# Patient Record
Sex: Male | Born: 1945 | Race: White | Hispanic: No | Smoking: Former smoker
Health system: Southern US, Community
[De-identification: ages and names within clinical notes are randomized; demographics above are authoritative.]

## PROBLEM LIST (undated history)

## (undated) DIAGNOSIS — W19XXXA Unspecified fall, initial encounter: Secondary | ICD-10-CM

## (undated) DIAGNOSIS — Z95810 Presence of automatic (implantable) cardiac defibrillator: Secondary | ICD-10-CM

## (undated) DIAGNOSIS — J449 Chronic obstructive pulmonary disease, unspecified: Secondary | ICD-10-CM

## (undated) DIAGNOSIS — I350 Nonrheumatic aortic (valve) stenosis: Secondary | ICD-10-CM

## (undated) DIAGNOSIS — R0989 Other specified symptoms and signs involving the circulatory and respiratory systems: Secondary | ICD-10-CM

## (undated) DIAGNOSIS — G4730 Sleep apnea, unspecified: Secondary | ICD-10-CM

## (undated) DIAGNOSIS — I255 Ischemic cardiomyopathy: Secondary | ICD-10-CM

## (undated) DIAGNOSIS — R55 Syncope and collapse: Secondary | ICD-10-CM

## (undated) DIAGNOSIS — I2510 Atherosclerotic heart disease of native coronary artery without angina pectoris: Secondary | ICD-10-CM

## (undated) DIAGNOSIS — F17201 Nicotine dependence, unspecified, in remission: Secondary | ICD-10-CM

## (undated) DIAGNOSIS — K859 Acute pancreatitis without necrosis or infection, unspecified: Secondary | ICD-10-CM

## (undated) DIAGNOSIS — E785 Hyperlipidemia, unspecified: Secondary | ICD-10-CM

## (undated) DIAGNOSIS — I219 Acute myocardial infarction, unspecified: Secondary | ICD-10-CM

## (undated) DIAGNOSIS — I10 Essential (primary) hypertension: Secondary | ICD-10-CM

## (undated) DIAGNOSIS — I5042 Chronic combined systolic (congestive) and diastolic (congestive) heart failure: Secondary | ICD-10-CM

## (undated) HISTORY — DX: Other specified symptoms and signs involving the circulatory and respiratory systems: R09.89

## (undated) HISTORY — DX: Hyperlipidemia, unspecified: E78.5

## (undated) HISTORY — DX: Essential (primary) hypertension: I10

## (undated) HISTORY — DX: Ischemic cardiomyopathy: I25.5

## (undated) HISTORY — PX: FEMORAL HERNIA REPAIR: SHX632

## (undated) HISTORY — DX: Unspecified fall, initial encounter: W19.XXXA

## (undated) HISTORY — DX: Nonrheumatic aortic (valve) stenosis: I35.0

## (undated) HISTORY — DX: Syncope and collapse: R55

## (undated) HISTORY — DX: Acute pancreatitis without necrosis or infection, unspecified: K85.90

## (undated) HISTORY — DX: Acute myocardial infarction, unspecified: I21.9

## (undated) HISTORY — DX: Presence of automatic (implantable) cardiac defibrillator: Z95.810

## (undated) HISTORY — PX: CARDIAC CATHETERIZATION: SHX172

## (undated) HISTORY — DX: Nicotine dependence, unspecified, in remission: F17.201

---

## 1998-01-23 DIAGNOSIS — R55 Syncope and collapse: Secondary | ICD-10-CM

## 1998-01-23 HISTORY — DX: Syncope and collapse: R55

## 1998-12-19 ENCOUNTER — Inpatient Hospital Stay (HOSPITAL_COMMUNITY): Admission: EM | Admit: 1998-12-19 | Discharge: 1998-12-29 | Payer: Self-pay

## 1998-12-19 ENCOUNTER — Encounter: Payer: Self-pay | Admitting: Emergency Medicine

## 1998-12-22 ENCOUNTER — Encounter: Payer: Self-pay | Admitting: General Surgery

## 2001-06-10 ENCOUNTER — Ambulatory Visit (HOSPITAL_COMMUNITY): Admission: RE | Admit: 2001-06-10 | Discharge: 2001-06-10 | Payer: Self-pay | Admitting: Cardiology

## 2001-10-07 ENCOUNTER — Ambulatory Visit (HOSPITAL_COMMUNITY): Admission: RE | Admit: 2001-10-07 | Discharge: 2001-10-07 | Payer: Self-pay | Admitting: Cardiology

## 2002-04-29 ENCOUNTER — Encounter: Payer: Self-pay | Admitting: Internal Medicine

## 2002-04-29 ENCOUNTER — Ambulatory Visit (HOSPITAL_COMMUNITY): Admission: RE | Admit: 2002-04-29 | Discharge: 2002-04-29 | Payer: Self-pay | Admitting: Internal Medicine

## 2002-05-01 ENCOUNTER — Encounter: Payer: Self-pay | Admitting: Internal Medicine

## 2002-05-01 ENCOUNTER — Ambulatory Visit (HOSPITAL_COMMUNITY): Admission: RE | Admit: 2002-05-01 | Discharge: 2002-05-01 | Payer: Self-pay | Admitting: Internal Medicine

## 2002-10-06 ENCOUNTER — Encounter: Payer: Self-pay | Admitting: Cardiology

## 2002-10-06 ENCOUNTER — Ambulatory Visit (HOSPITAL_COMMUNITY): Admission: RE | Admit: 2002-10-06 | Discharge: 2002-10-06 | Payer: Self-pay | Admitting: Cardiology

## 2004-01-24 DIAGNOSIS — R0989 Other specified symptoms and signs involving the circulatory and respiratory systems: Secondary | ICD-10-CM

## 2004-01-24 HISTORY — DX: Other specified symptoms and signs involving the circulatory and respiratory systems: R09.89

## 2004-02-29 ENCOUNTER — Ambulatory Visit: Payer: Self-pay | Admitting: Cardiology

## 2004-06-14 ENCOUNTER — Ambulatory Visit: Payer: Self-pay | Admitting: Cardiology

## 2004-06-14 ENCOUNTER — Ambulatory Visit (HOSPITAL_COMMUNITY): Admission: RE | Admit: 2004-06-14 | Discharge: 2004-06-14 | Payer: Self-pay | Admitting: Cardiology

## 2004-06-15 ENCOUNTER — Ambulatory Visit (HOSPITAL_COMMUNITY): Admission: RE | Admit: 2004-06-15 | Discharge: 2004-06-15 | Payer: Self-pay | Admitting: Cardiology

## 2004-06-15 ENCOUNTER — Encounter (HOSPITAL_COMMUNITY): Admission: RE | Admit: 2004-06-15 | Discharge: 2004-07-15 | Payer: Self-pay | Admitting: *Deleted

## 2004-06-15 ENCOUNTER — Ambulatory Visit: Payer: Self-pay | Admitting: Cardiology

## 2005-03-10 ENCOUNTER — Ambulatory Visit: Payer: Self-pay | Admitting: Cardiology

## 2006-05-04 ENCOUNTER — Ambulatory Visit: Payer: Self-pay | Admitting: Cardiology

## 2006-05-07 ENCOUNTER — Ambulatory Visit (HOSPITAL_COMMUNITY): Admission: RE | Admit: 2006-05-07 | Discharge: 2006-05-07 | Payer: Self-pay | Admitting: Cardiology

## 2006-05-07 ENCOUNTER — Ambulatory Visit: Payer: Self-pay | Admitting: Cardiovascular Disease

## 2006-06-08 ENCOUNTER — Ambulatory Visit: Payer: Self-pay | Admitting: Cardiology

## 2006-06-11 ENCOUNTER — Ambulatory Visit: Payer: Self-pay | Admitting: Cardiology

## 2006-06-13 ENCOUNTER — Ambulatory Visit (HOSPITAL_COMMUNITY): Admission: RE | Admit: 2006-06-13 | Discharge: 2006-06-13 | Payer: Self-pay | Admitting: General Surgery

## 2006-06-24 HISTORY — PX: CARDIAC DEFIBRILLATOR PLACEMENT: SHX171

## 2006-07-05 ENCOUNTER — Ambulatory Visit: Payer: Self-pay | Admitting: Internal Medicine

## 2006-07-12 ENCOUNTER — Ambulatory Visit: Payer: Self-pay | Admitting: Internal Medicine

## 2006-07-12 LAB — CONVERTED CEMR LAB
Basophils Relative: 0.6 % (ref 0.0–1.0)
CO2: 33 meq/L — ABNORMAL HIGH (ref 19–32)
Chloride: 110 meq/L (ref 96–112)
Eosinophils Absolute: 0.4 10*3/uL (ref 0.0–0.6)
Eosinophils Relative: 4.7 % (ref 0.0–5.0)
GFR calc non Af Amer: 91 mL/min
Glucose, Bld: 97 mg/dL (ref 70–99)
Hemoglobin: 15.3 g/dL (ref 13.0–17.0)
MCHC: 33.6 g/dL (ref 30.0–36.0)
Monocytes Absolute: 0.7 10*3/uL (ref 0.2–0.7)
Monocytes Relative: 7.6 % (ref 3.0–11.0)
Platelets: 180 10*3/uL (ref 150–400)
RBC: 4.71 M/uL (ref 4.22–5.81)
RDW: 13.4 % (ref 11.5–14.6)
Sodium: 142 meq/L (ref 135–145)

## 2006-07-17 ENCOUNTER — Ambulatory Visit: Payer: Self-pay | Admitting: Internal Medicine

## 2006-07-17 ENCOUNTER — Inpatient Hospital Stay (HOSPITAL_COMMUNITY): Admission: RE | Admit: 2006-07-17 | Discharge: 2006-07-18 | Payer: Self-pay | Admitting: Internal Medicine

## 2006-08-06 ENCOUNTER — Ambulatory Visit: Payer: Self-pay

## 2006-11-14 ENCOUNTER — Ambulatory Visit: Payer: Self-pay | Admitting: Internal Medicine

## 2007-02-07 ENCOUNTER — Ambulatory Visit: Payer: Self-pay | Admitting: Cardiology

## 2007-05-07 ENCOUNTER — Ambulatory Visit: Payer: Self-pay | Admitting: Internal Medicine

## 2007-07-10 IMAGING — CR DG CHEST 2V
2 series · 2 of 2 positions shown · non-contrast
Comparison: 07/17/06.

CLINICAL DATA: Post-AICD placement.  
 CHEST ? 2 VIEW:

[w chest pa]
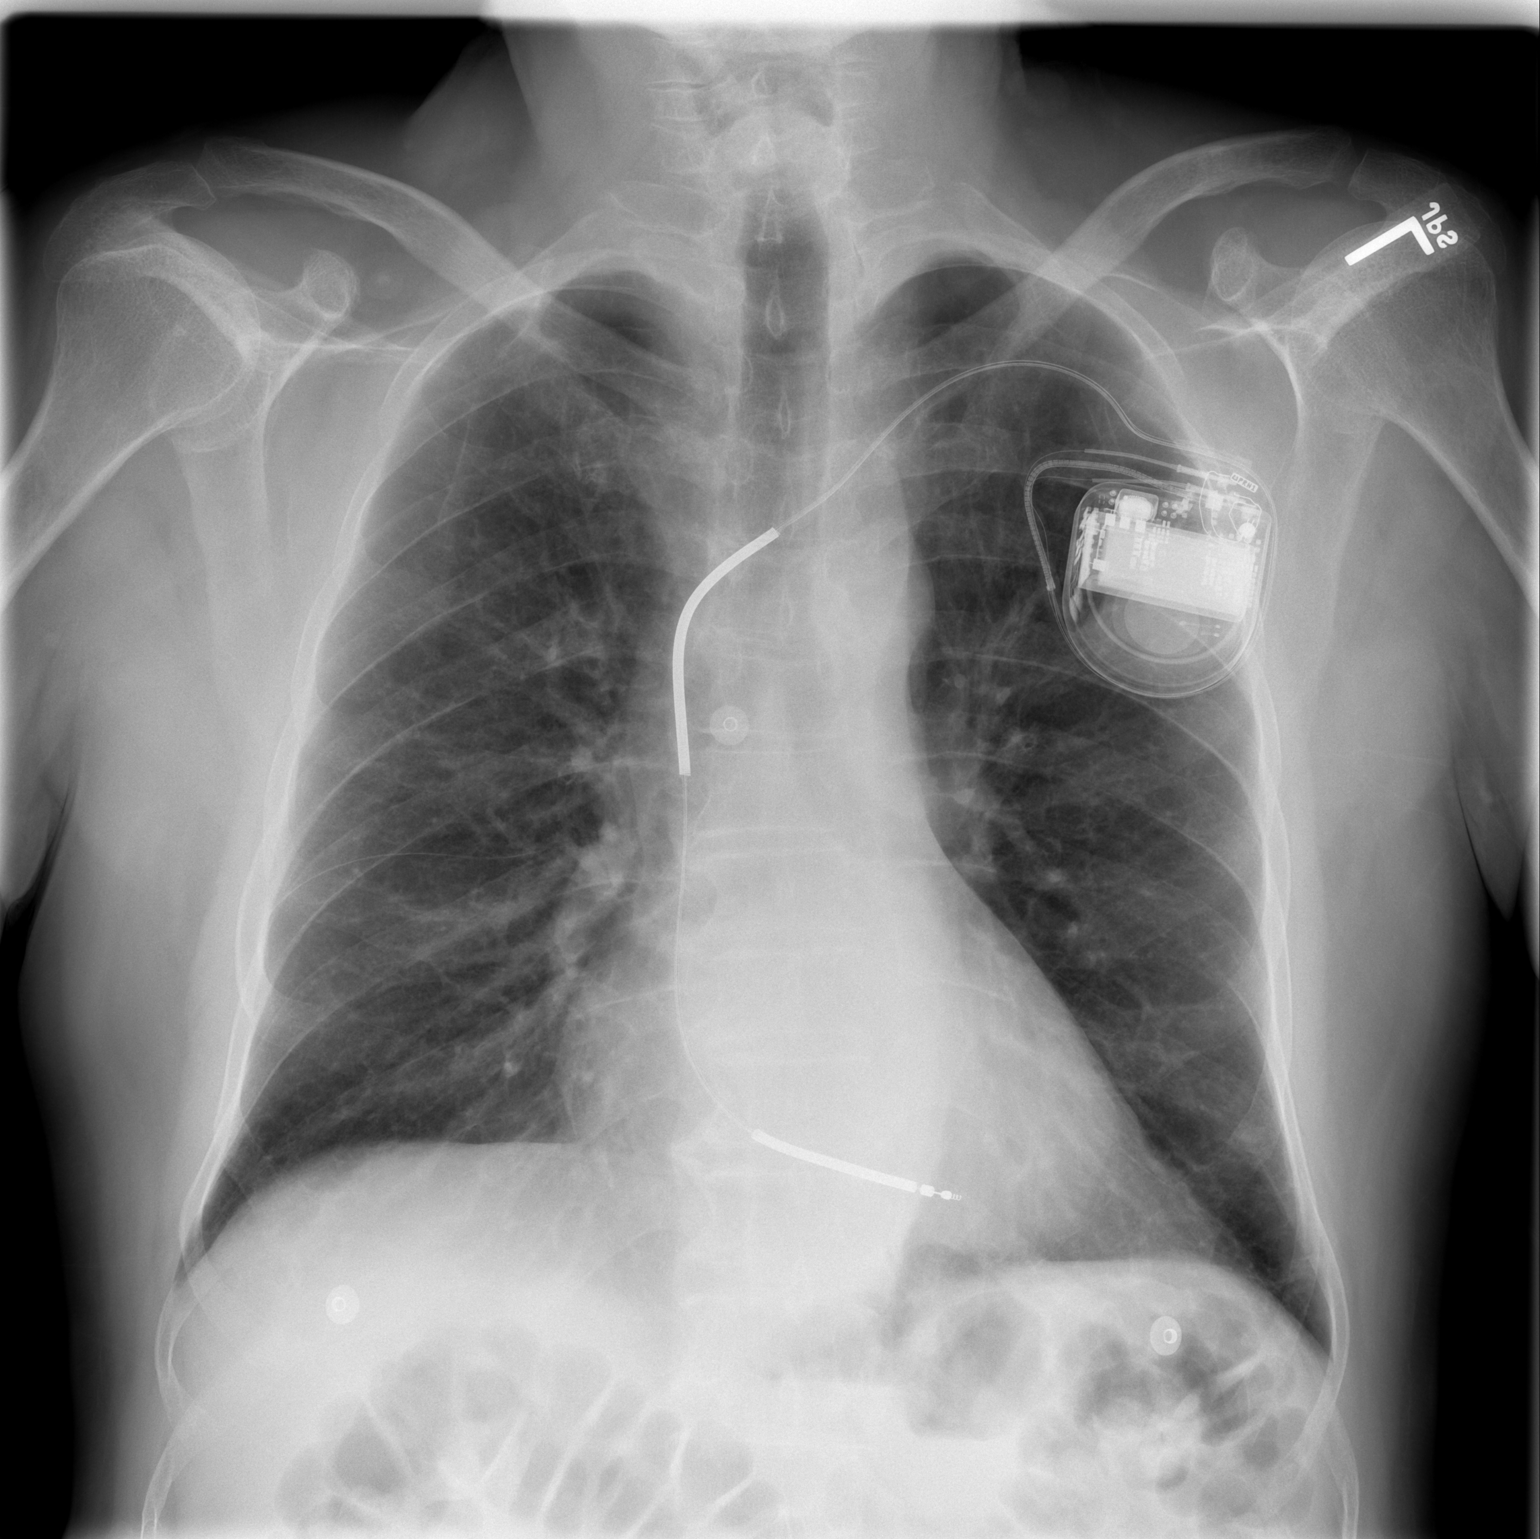

[w chest lat]
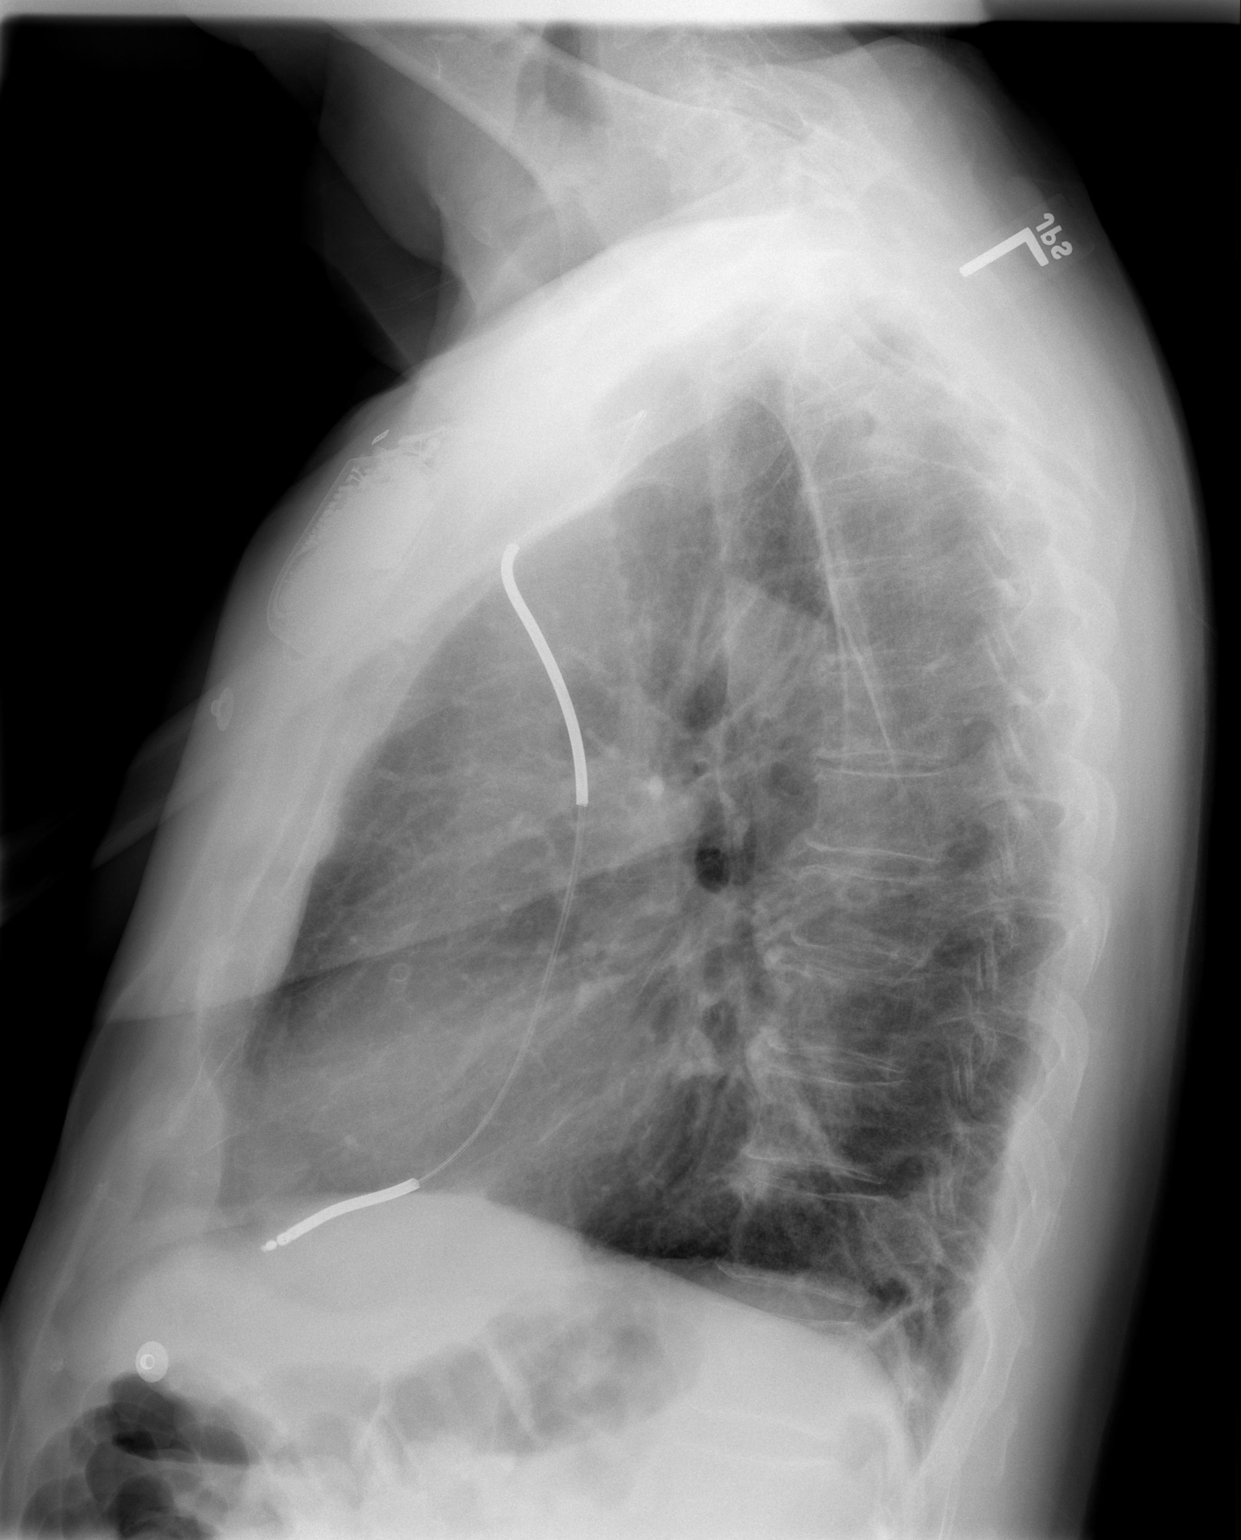

[2 of 2 positions shown; findings below may reference images not displayed]

FINDINGS: Status-post placement of a single lead AICD with its tip in the right ventricle.  No pneumothorax.  Lungs clear.  No heart failure.
IMPRESSION: Status-post placement of a single lead AICD ? no pneumothorax or active disease.

## 2007-07-31 ENCOUNTER — Ambulatory Visit: Payer: Self-pay | Admitting: Internal Medicine

## 2007-10-29 ENCOUNTER — Ambulatory Visit: Payer: Self-pay | Admitting: Internal Medicine

## 2008-01-28 ENCOUNTER — Ambulatory Visit: Payer: Self-pay | Admitting: Internal Medicine

## 2008-02-04 ENCOUNTER — Inpatient Hospital Stay (HOSPITAL_COMMUNITY): Admission: EM | Admit: 2008-02-04 | Discharge: 2008-02-06 | Payer: Self-pay | Admitting: Gastroenterology

## 2008-02-04 ENCOUNTER — Ambulatory Visit: Payer: Self-pay | Admitting: Cardiology

## 2008-02-04 ENCOUNTER — Ambulatory Visit (HOSPITAL_COMMUNITY): Admission: RE | Admit: 2008-02-04 | Discharge: 2008-02-04 | Payer: Self-pay | Admitting: Family Medicine

## 2008-02-05 ENCOUNTER — Encounter (INDEPENDENT_AMBULATORY_CARE_PROVIDER_SITE_OTHER): Payer: Self-pay | Admitting: Family Medicine

## 2008-02-13 ENCOUNTER — Ambulatory Visit: Payer: Self-pay | Admitting: Cardiology

## 2008-02-17 ENCOUNTER — Ambulatory Visit: Payer: Self-pay | Admitting: Cardiology

## 2008-02-17 ENCOUNTER — Encounter: Payer: Self-pay | Admitting: Physician Assistant

## 2008-02-26 ENCOUNTER — Ambulatory Visit: Payer: Self-pay | Admitting: Cardiology

## 2008-03-03 ENCOUNTER — Ambulatory Visit (HOSPITAL_COMMUNITY): Admission: RE | Admit: 2008-03-03 | Discharge: 2008-03-03 | Payer: Self-pay | Admitting: Cardiology

## 2008-03-18 ENCOUNTER — Encounter: Payer: Self-pay | Admitting: Internal Medicine

## 2008-05-14 ENCOUNTER — Encounter: Payer: Self-pay | Admitting: Internal Medicine

## 2008-05-14 ENCOUNTER — Ambulatory Visit: Payer: Self-pay | Admitting: Internal Medicine

## 2008-07-13 DIAGNOSIS — I2589 Other forms of chronic ischemic heart disease: Secondary | ICD-10-CM | POA: Insufficient documentation

## 2008-07-29 ENCOUNTER — Ambulatory Visit: Payer: Self-pay | Admitting: Internal Medicine

## 2008-07-29 ENCOUNTER — Encounter: Payer: Self-pay | Admitting: Internal Medicine

## 2008-08-03 ENCOUNTER — Encounter: Payer: Self-pay | Admitting: Cardiology

## 2008-08-03 ENCOUNTER — Telehealth: Payer: Self-pay | Admitting: Cardiology

## 2008-09-02 ENCOUNTER — Ambulatory Visit: Payer: Self-pay | Admitting: Cardiology

## 2008-09-02 ENCOUNTER — Emergency Department (HOSPITAL_COMMUNITY): Admission: EM | Admit: 2008-09-02 | Discharge: 2008-09-02 | Payer: Self-pay | Admitting: Emergency Medicine

## 2008-09-08 ENCOUNTER — Encounter: Payer: Self-pay | Admitting: Cardiology

## 2008-09-08 ENCOUNTER — Ambulatory Visit: Payer: Self-pay | Admitting: Cardiology

## 2008-09-08 LAB — CONVERTED CEMR LAB
BUN: 15 mg/dL (ref 6–23)
Basophils Absolute: 0 10*3/uL (ref 0.0–0.1)
Basophils Relative: 0 % (ref 0–1)
CO2: 30 meq/L (ref 19–32)
Calcium: 9.6 mg/dL (ref 8.4–10.5)
Chloride: 107 meq/L (ref 96–112)
Creatinine, Ser: 0.97 mg/dL (ref 0.40–1.50)
Eosinophils Absolute: 0.3 10*3/uL (ref 0.0–0.7)
Eosinophils Relative: 3 % (ref 0–5)
Glucose, Bld: 105 mg/dL — ABNORMAL HIGH (ref 70–99)
HCT: 43.1 % (ref 39.0–52.0)
Hemoglobin: 14.8 g/dL (ref 13.0–17.0)
INR: 1 (ref 0.0–1.5)
Lymphocytes Relative: 18 % (ref 12–46)
Lymphs Abs: 1.4 10*3/uL (ref 0.7–4.0)
MCHC: 34.3 g/dL (ref 30.0–36.0)
MCV: 93.2 fL (ref 78.0–100.0)
Monocytes Absolute: 0.6 10*3/uL (ref 0.1–1.0)
Monocytes Relative: 8 % (ref 3–12)
Neutro Abs: 5.8 10*3/uL (ref 1.7–7.7)
Neutrophils Relative %: 71 % (ref 43–77)
Platelets: 184 10*3/uL (ref 150–400)
Potassium: 4.8 meq/L (ref 3.5–5.3)
Prothrombin Time: 13.1 s (ref 11.6–15.2)
RBC: 4.63 M/uL (ref 4.22–5.81)
RDW: 13.4 % (ref 11.5–15.5)
Sodium: 140 meq/L (ref 135–145)
WBC: 8.1 10*3/uL (ref 4.0–10.5)
aPTT: 32 s (ref 24–37)

## 2008-09-16 ENCOUNTER — Ambulatory Visit: Payer: Self-pay | Admitting: Cardiology

## 2008-09-16 ENCOUNTER — Ambulatory Visit (HOSPITAL_COMMUNITY): Admission: RE | Admit: 2008-09-16 | Discharge: 2008-09-16 | Payer: Self-pay | Admitting: Cardiology

## 2008-09-18 ENCOUNTER — Encounter (INDEPENDENT_AMBULATORY_CARE_PROVIDER_SITE_OTHER): Payer: Self-pay | Admitting: *Deleted

## 2008-09-18 LAB — CONVERTED CEMR LAB
BUN: 15 mg/dL
Calcium: 9.6 mg/dL
Creatinine, Ser: 0.97 mg/dL
Glucose, Bld: 105 mg/dL
INR: 1
Prothrombin Time: 13.1 s
Sodium: 140 meq/L

## 2008-09-23 ENCOUNTER — Encounter: Payer: Self-pay | Admitting: Cardiology

## 2008-09-23 HISTORY — PX: CORONARY ARTERY BYPASS GRAFT: SHX141

## 2008-09-24 ENCOUNTER — Ambulatory Visit: Payer: Self-pay | Admitting: Cardiothoracic Surgery

## 2008-09-30 ENCOUNTER — Encounter: Payer: Self-pay | Admitting: Cardiothoracic Surgery

## 2008-09-30 ENCOUNTER — Ambulatory Visit (HOSPITAL_COMMUNITY): Admission: RE | Admit: 2008-09-30 | Discharge: 2008-09-30 | Payer: Self-pay | Admitting: Cardiothoracic Surgery

## 2008-10-02 ENCOUNTER — Inpatient Hospital Stay (HOSPITAL_COMMUNITY): Admission: RE | Admit: 2008-10-02 | Discharge: 2008-10-11 | Payer: Self-pay | Admitting: Cardiothoracic Surgery

## 2008-10-02 ENCOUNTER — Ambulatory Visit: Payer: Self-pay | Admitting: Cardiovascular Disease

## 2008-10-02 ENCOUNTER — Ambulatory Visit: Payer: Self-pay | Admitting: Cardiothoracic Surgery

## 2008-10-02 ENCOUNTER — Encounter: Payer: Self-pay | Admitting: Cardiothoracic Surgery

## 2008-10-09 ENCOUNTER — Encounter: Payer: Self-pay | Admitting: Cardiothoracic Surgery

## 2008-10-09 ENCOUNTER — Encounter (INDEPENDENT_AMBULATORY_CARE_PROVIDER_SITE_OTHER): Payer: Self-pay | Admitting: *Deleted

## 2008-10-09 LAB — CONVERTED CEMR LAB
BUN: 19 mg/dL
Brain Natriuretic Peptide: 289
CO2: 31 meq/L
Glomerular Filtration Rate, Af Am: >60 mL/min/{1.73_m2}
Glucose, Bld: 115 mg/dL
Platelets: 167 10*3/uL
Potassium: 3.6 meq/L
Sodium: 135 meq/L
WBC: 9.7 10*3/uL

## 2008-10-22 ENCOUNTER — Encounter: Payer: Self-pay | Admitting: *Deleted

## 2008-10-23 ENCOUNTER — Encounter: Payer: Self-pay | Admitting: Cardiology

## 2008-10-28 ENCOUNTER — Encounter (INDEPENDENT_AMBULATORY_CARE_PROVIDER_SITE_OTHER): Payer: Self-pay | Admitting: *Deleted

## 2008-10-28 ENCOUNTER — Ambulatory Visit: Payer: Self-pay | Admitting: Cardiology

## 2008-10-28 DIAGNOSIS — J449 Chronic obstructive pulmonary disease, unspecified: Secondary | ICD-10-CM | POA: Insufficient documentation

## 2008-11-05 ENCOUNTER — Ambulatory Visit: Payer: Self-pay | Admitting: Cardiothoracic Surgery

## 2008-11-05 ENCOUNTER — Encounter: Admission: RE | Admit: 2008-11-05 | Discharge: 2008-11-05 | Payer: Self-pay | Admitting: Cardiothoracic Surgery

## 2008-11-05 ENCOUNTER — Encounter: Payer: Self-pay | Admitting: Cardiology

## 2008-11-06 ENCOUNTER — Encounter: Payer: Self-pay | Admitting: Internal Medicine

## 2008-11-12 ENCOUNTER — Encounter: Payer: Self-pay | Admitting: Internal Medicine

## 2008-11-13 ENCOUNTER — Ambulatory Visit: Payer: Self-pay | Admitting: Internal Medicine

## 2008-11-19 ENCOUNTER — Encounter: Payer: Self-pay | Admitting: Internal Medicine

## 2008-11-30 ENCOUNTER — Encounter (HOSPITAL_COMMUNITY): Admission: RE | Admit: 2008-11-30 | Discharge: 2008-12-30 | Payer: Self-pay | Admitting: Cardiology

## 2008-12-03 ENCOUNTER — Ambulatory Visit: Payer: Self-pay | Admitting: Cardiothoracic Surgery

## 2008-12-03 ENCOUNTER — Encounter: Admission: RE | Admit: 2008-12-03 | Discharge: 2008-12-03 | Payer: Self-pay | Admitting: Cardiothoracic Surgery

## 2008-12-23 ENCOUNTER — Encounter: Payer: Self-pay | Admitting: Cardiology

## 2008-12-30 ENCOUNTER — Encounter (HOSPITAL_COMMUNITY): Admission: RE | Admit: 2008-12-30 | Discharge: 2009-01-22 | Payer: Self-pay | Admitting: Cardiology

## 2009-01-01 ENCOUNTER — Encounter: Payer: Self-pay | Admitting: Cardiology

## 2009-01-20 ENCOUNTER — Encounter: Payer: Self-pay | Admitting: Cardiology

## 2009-01-25 ENCOUNTER — Encounter (HOSPITAL_COMMUNITY): Admission: RE | Admit: 2009-01-25 | Discharge: 2009-02-24 | Payer: Self-pay | Admitting: Cardiology

## 2009-02-12 ENCOUNTER — Telehealth (INDEPENDENT_AMBULATORY_CARE_PROVIDER_SITE_OTHER): Payer: Self-pay | Admitting: *Deleted

## 2009-02-16 ENCOUNTER — Ambulatory Visit: Payer: Self-pay | Admitting: Internal Medicine

## 2009-02-24 ENCOUNTER — Encounter: Payer: Self-pay | Admitting: Internal Medicine

## 2009-02-25 ENCOUNTER — Encounter (INDEPENDENT_AMBULATORY_CARE_PROVIDER_SITE_OTHER): Payer: Self-pay | Admitting: *Deleted

## 2009-02-25 ENCOUNTER — Encounter (HOSPITAL_COMMUNITY): Admission: RE | Admit: 2009-02-25 | Discharge: 2009-03-27 | Payer: Self-pay | Admitting: Cardiology

## 2009-02-26 ENCOUNTER — Encounter (INDEPENDENT_AMBULATORY_CARE_PROVIDER_SITE_OTHER): Payer: Self-pay | Admitting: *Deleted

## 2009-02-26 ENCOUNTER — Ambulatory Visit: Payer: Self-pay | Admitting: Cardiology

## 2009-02-26 DIAGNOSIS — I2510 Atherosclerotic heart disease of native coronary artery without angina pectoris: Secondary | ICD-10-CM | POA: Insufficient documentation

## 2009-02-26 DIAGNOSIS — Z95810 Presence of automatic (implantable) cardiac defibrillator: Secondary | ICD-10-CM | POA: Insufficient documentation

## 2009-03-01 ENCOUNTER — Encounter (INDEPENDENT_AMBULATORY_CARE_PROVIDER_SITE_OTHER): Payer: Self-pay | Admitting: *Deleted

## 2009-03-01 ENCOUNTER — Encounter: Payer: Self-pay | Admitting: Cardiology

## 2009-03-01 LAB — CONVERTED CEMR LAB
ALT: 32 units/L (ref 0–53)
AST: 24 units/L (ref 0–37)
Albumin: 4.3 g/dL
Alkaline Phosphatase: 58 units/L (ref 39–117)
BUN: 18 mg/dL
Basophils Absolute: 0 10*3/uL
Basophils Absolute: 0 10*3/uL (ref 0.0–0.1)
Basophils Relative: 0 %
Basophils Relative: 0 % (ref 0–1)
CO2: 28 meq/L
Calcium: 9.3 mg/dL
Calcium: 9.3 mg/dL (ref 8.4–10.5)
Chloride: 105 meq/L
Chloride: 105 meq/L (ref 96–112)
Cholesterol: 100 mg/dL (ref 0–200)
Eosinophils Absolute: 0.2 10*3/uL
Eosinophils Absolute: 0.2 10*3/uL (ref 0.0–0.7)
Eosinophils Relative: 3 % (ref 0–5)
Glucose, Bld: 99 mg/dL
HCT: 45.8 %
MCHC: 31.9 g/dL
MCV: 92.7 fL
Monocytes Absolute: 0.5 10*3/uL
Monocytes Relative: 7 %
Monocytes Relative: 7 % (ref 3–12)
Neutro Abs: 4.8 10*3/uL (ref 1.7–7.7)
Neutrophils Relative %: 66 % (ref 43–77)
Platelets: 162 10*3/uL
Potassium: 4.7 meq/L
Potassium: 4.7 meq/L (ref 3.5–5.3)
RBC: 4.94 M/uL (ref 4.22–5.81)
Sodium: 141 meq/L
TSH: 2.119 microintl units/mL (ref 0.350–4.500)
Total Protein: 6.4 g/dL (ref 6.0–8.3)
WBC: 7.2 10*3/uL
WBC: 7.2 10*3/uL (ref 4.0–10.5)

## 2009-03-02 ENCOUNTER — Encounter: Payer: Self-pay | Admitting: Cardiology

## 2009-04-07 ENCOUNTER — Telehealth (INDEPENDENT_AMBULATORY_CARE_PROVIDER_SITE_OTHER): Payer: Self-pay | Admitting: *Deleted

## 2009-04-09 ENCOUNTER — Encounter: Payer: Self-pay | Admitting: Cardiology

## 2009-05-18 ENCOUNTER — Ambulatory Visit: Payer: Self-pay | Admitting: Internal Medicine

## 2009-06-03 ENCOUNTER — Encounter: Payer: Self-pay | Admitting: Internal Medicine

## 2009-06-08 ENCOUNTER — Telehealth (INDEPENDENT_AMBULATORY_CARE_PROVIDER_SITE_OTHER): Payer: Self-pay | Admitting: *Deleted

## 2009-07-09 ENCOUNTER — Telehealth (INDEPENDENT_AMBULATORY_CARE_PROVIDER_SITE_OTHER): Payer: Self-pay | Admitting: *Deleted

## 2009-08-10 ENCOUNTER — Ambulatory Visit: Payer: Self-pay | Admitting: Internal Medicine

## 2009-08-24 ENCOUNTER — Encounter (INDEPENDENT_AMBULATORY_CARE_PROVIDER_SITE_OTHER): Payer: Self-pay | Admitting: *Deleted

## 2009-08-27 ENCOUNTER — Encounter (INDEPENDENT_AMBULATORY_CARE_PROVIDER_SITE_OTHER): Payer: Self-pay | Admitting: *Deleted

## 2009-08-27 ENCOUNTER — Ambulatory Visit: Payer: Self-pay | Admitting: Cardiology

## 2009-08-27 DIAGNOSIS — D492 Neoplasm of unspecified behavior of bone, soft tissue, and skin: Secondary | ICD-10-CM

## 2009-09-01 ENCOUNTER — Ambulatory Visit (HOSPITAL_COMMUNITY): Admission: RE | Admit: 2009-09-01 | Discharge: 2009-09-01 | Payer: Self-pay | Admitting: Cardiology

## 2009-09-01 ENCOUNTER — Ambulatory Visit: Payer: Self-pay | Admitting: Cardiology

## 2009-09-01 ENCOUNTER — Telehealth (INDEPENDENT_AMBULATORY_CARE_PROVIDER_SITE_OTHER): Payer: Self-pay

## 2009-09-01 ENCOUNTER — Encounter: Payer: Self-pay | Admitting: Cardiology

## 2009-10-28 IMAGING — CR DG CHEST 2V
2 series · 2 of 2 positions shown · non-contrast
Comparison: Chest x-ray of 10/09/2008

CLINICAL DATA: CABG in September 2008, follow-up

CHEST - 2 VIEW

[w chest pa]
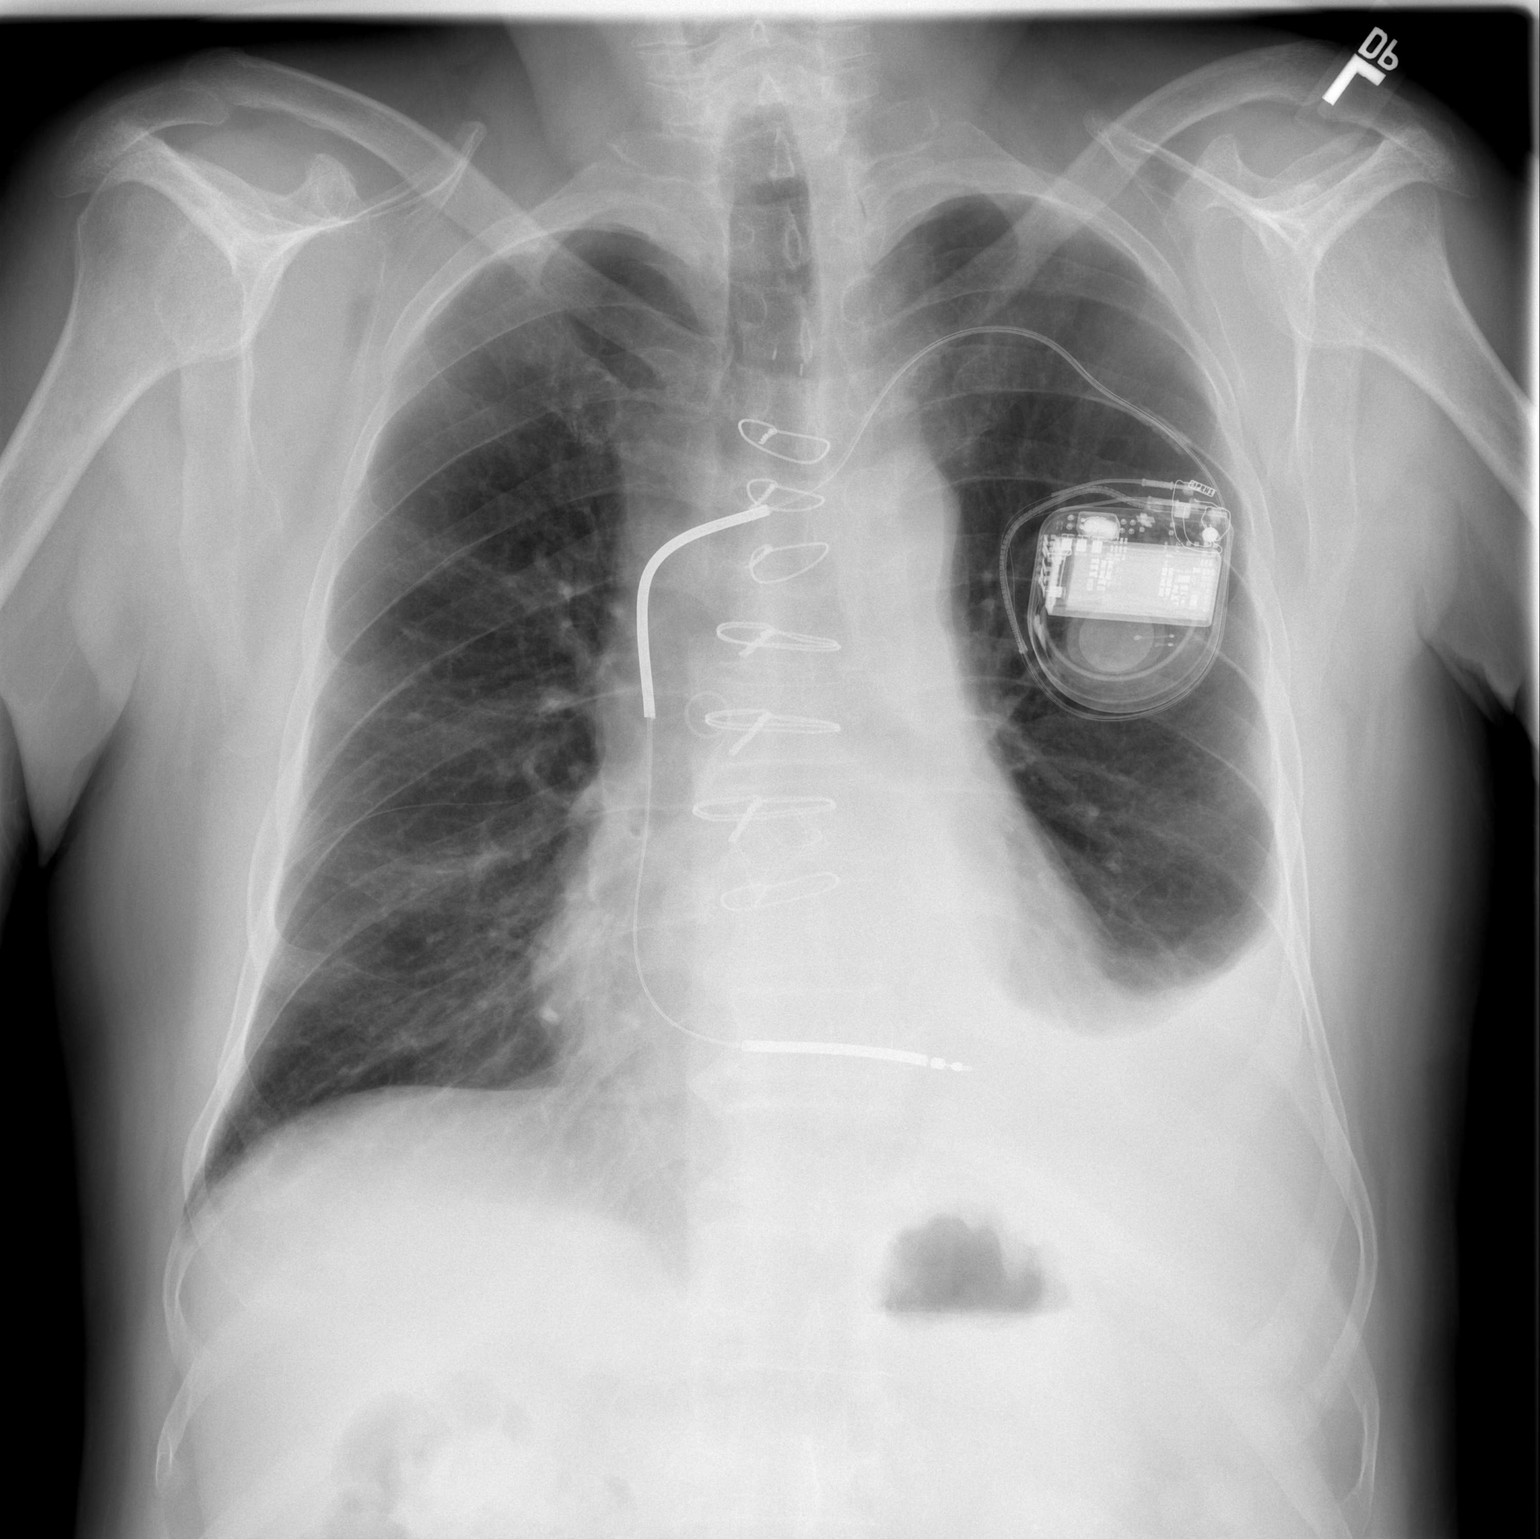

[w chest lat]
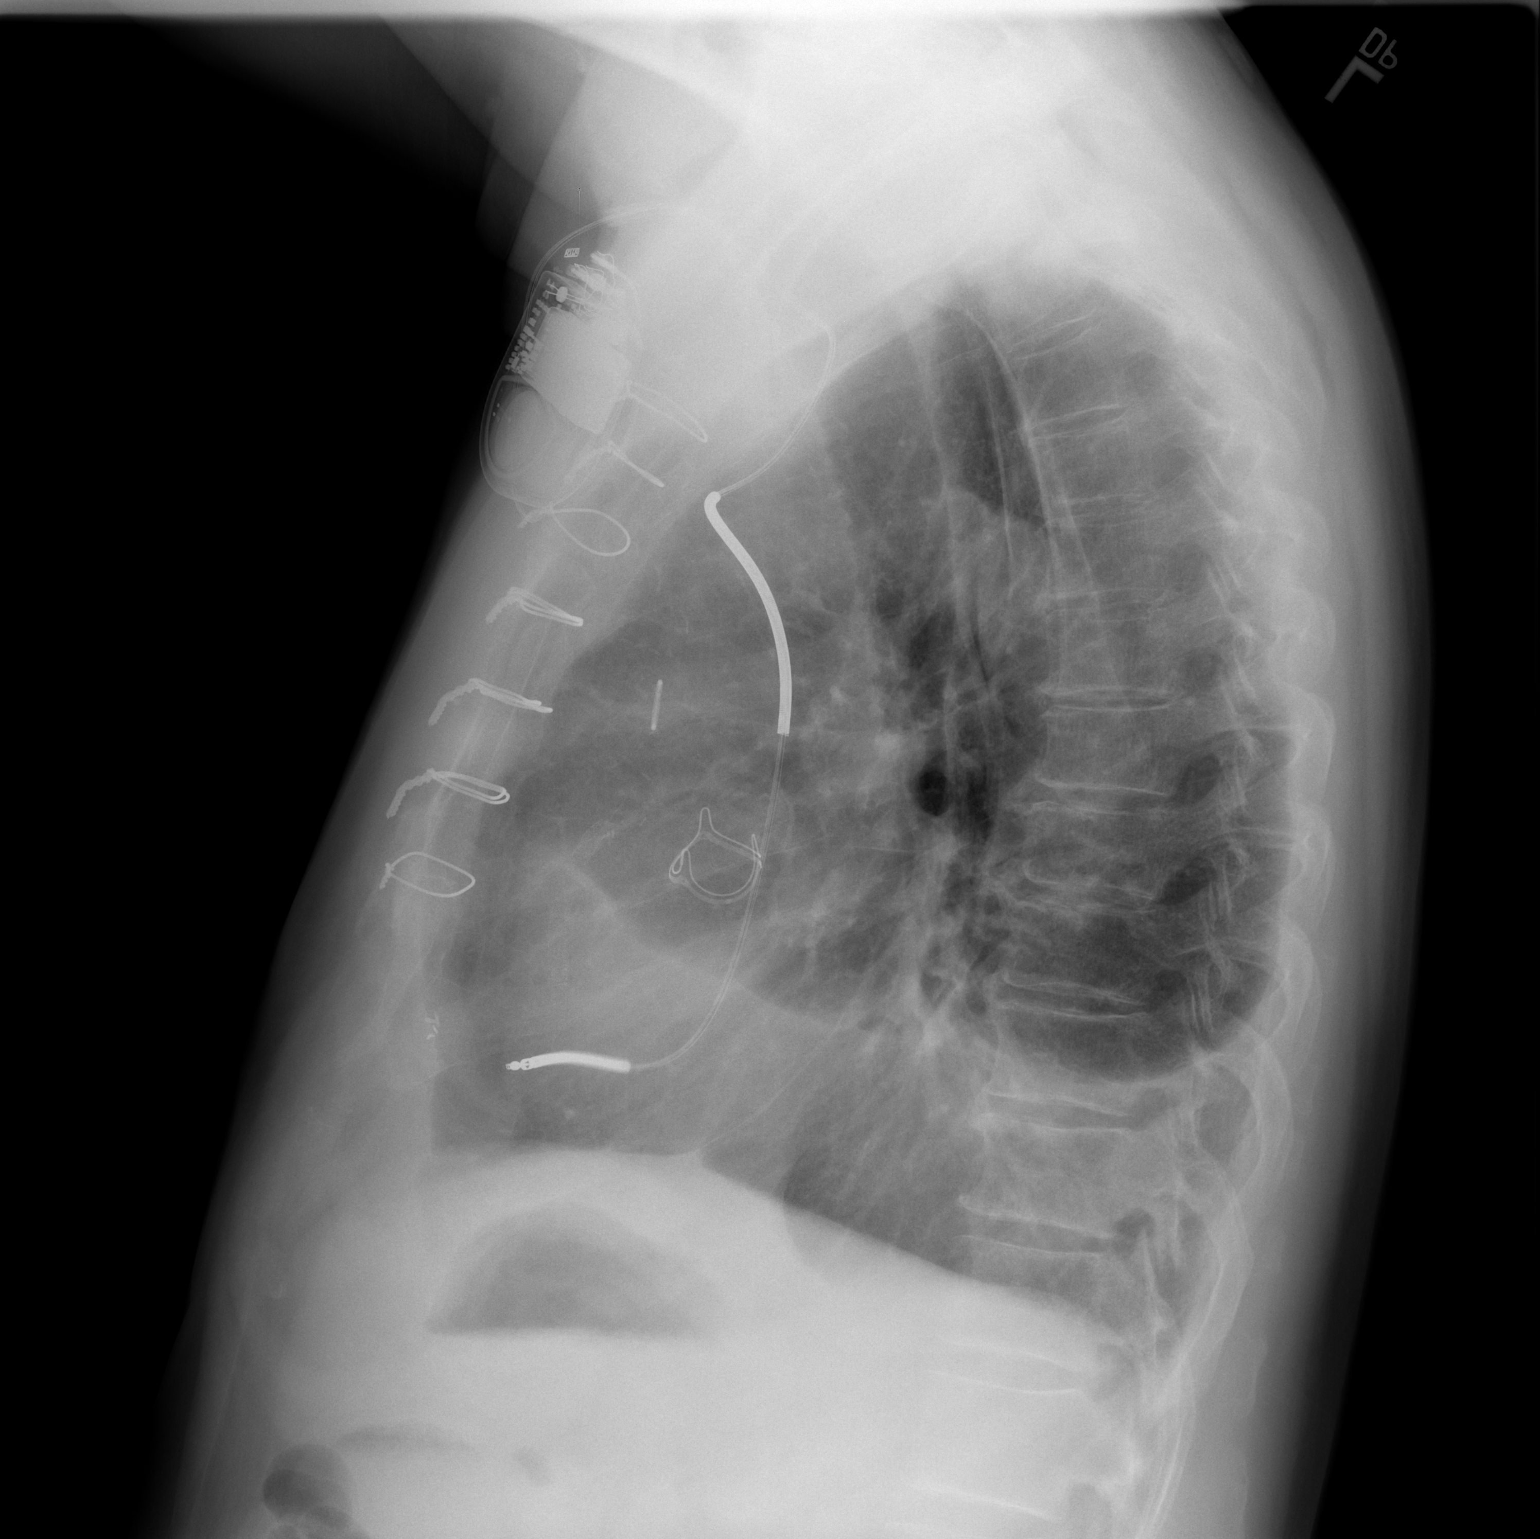

[2 of 2 positions shown; findings below may reference images not displayed]

FINDINGS: There is little change in the left pleural effusion and
left basilar atelectasis.  The small right effusion noted
previously has resolved.  No pneumothorax is seen.  Mild
cardiomegaly is present.  A permanent pacemaker with AICD lead
remains.  Median sternotomy sutures are present.
IMPRESSION: No significant change in small left pleural effusion with left
basilar atelectasis.  Resolution of small right effusion.

## 2009-11-01 ENCOUNTER — Ambulatory Visit: Payer: Self-pay | Admitting: Internal Medicine

## 2009-11-19 ENCOUNTER — Encounter: Payer: Self-pay | Admitting: Internal Medicine

## 2009-12-15 ENCOUNTER — Encounter: Payer: Self-pay | Admitting: Internal Medicine

## 2010-01-23 HISTORY — PX: COLONOSCOPY W/ POLYPECTOMY: SHX1380

## 2010-02-14 ENCOUNTER — Telehealth: Payer: Self-pay | Admitting: Internal Medicine

## 2010-02-20 LAB — CONVERTED CEMR LAB
AST: 22 units/L (ref 0–37)
Albumin: 3.8 g/dL (ref 3.5–5.2)
BUN: 10 mg/dL (ref 6–23)
Basophils Relative: 0 % (ref 0–1)
Calcium: 9.2 mg/dL (ref 8.4–10.5)
Creatinine, Ser: 0.92 mg/dL (ref 0.40–1.50)
Eosinophils Absolute: 0.2 10*3/uL (ref 0.0–0.7)
HCT: 42.4 % (ref 39.0–52.0)
INR: 1 (ref 0.0–1.5)
MCV: 93.1 fL (ref 78.0–100.0)
Monocytes Relative: 8 % (ref 3–12)
Neutro Abs: 5 10*3/uL (ref 1.7–7.7)
Neutrophils Relative %: 67 % (ref 43–77)
Prothrombin Time: 13 s (ref 11.6–15.2)
RDW: 13.8 % (ref 11.5–15.5)
TSH: 2.012 microintl units/mL (ref 0.350–4.500)
Total Bilirubin: 0.7 mg/dL (ref 0.3–1.2)
Total Protein: 6.5 g/dL (ref 6.0–8.3)
WBC: 7.4 10*3/uL (ref 4.0–10.5)

## 2010-02-24 ENCOUNTER — Encounter: Payer: Self-pay | Admitting: Internal Medicine

## 2010-02-24 ENCOUNTER — Ambulatory Visit: Admit: 2010-02-24 | Payer: Self-pay | Admitting: Internal Medicine

## 2010-02-24 ENCOUNTER — Encounter (INDEPENDENT_AMBULATORY_CARE_PROVIDER_SITE_OTHER): Payer: Medicare Other

## 2010-02-24 DIAGNOSIS — I428 Other cardiomyopathies: Secondary | ICD-10-CM

## 2010-02-24 NOTE — Miscellaneous (Signed)
Summary: Rehab Report  Rehab Report   Imported By: Faythe Ghee 01/28/2009 12:51:03  _____________________________________________________________________  External Attachment:    Type:   Image     Comment:   External Document

## 2010-02-24 NOTE — Cardiovascular Report (Signed)
Summary: Office Visit Remote   Office Visit Remote   Imported By: Roderic Ovens 06/07/2009 15:36:22  _____________________________________________________________________  External Attachment:    Type:   Image     Comment:   External Document

## 2010-02-24 NOTE — Miscellaneous (Signed)
Summary: [name] Discharge Note  [name] Discharge Note   Imported By: Roderic Ovens 05/19/2009 16:14:29  _____________________________________________________________________  External Attachment:    Type:   Image     Comment:   External Document
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
## 2010-02-24 NOTE — Progress Notes (Signed)
Summary: RESULTS OF ECHO  Phone Note Call from Patient   Summary of Call: PT STATES HE HAD AN ECHO THIS AM AND STARTING 8-15 THE NUMBER TO REACH HIM AT FOR THAT WEEK IS [phone] TO GIVE RESULTS. Initial call taken by: Park Breed,  September 01, 2009 12:01 PM  Follow-up for Phone Call        Pt. advised. Follow-up by: Larita Fife Via LPN,  September 17, 2009 9:46 AM

## 2010-02-24 NOTE — Letter (Signed)
Summary: [place] Results Letter  [facility]  [address]   [place], [place] [address]   Phone: [phone]  Fax: [phone]      March 02, 2009 MRN: [id number]   [name] [address] [place], [place]  [address]   Dear [name],  Your test ordered by  Heartcare has been reviewed by your physician (or physician assistant) and was found to be normal or stable. Your physician (or physician assistant) felt no changes were needed at this time.  ____ Echocardiogram  ____ Cardiac Stress Test  __X__ Lab Work  ____ Peripheral vascular study of arms, legs or neck  ____ CT scan or X-ray  ____ Lung or Breathing test  ____ Other: Please continue on current medical treatment.  Thank you.   Wacey Zieger, MD, F.A.C.C   

## 2010-02-24 NOTE — Progress Notes (Signed)
Summary: question re device  Phone Note Call from Patient Call back at Home Phone [phone]   Caller: Patient Reason for Call: Talk to Nurse Summary of Call: pt calling re results from transmission Initial call taken by: Roe Coombs,  February 14, 2010 2:30 PM  Follow-up for Phone Call        lmom --pt to send transmission on 02-24-10.  pt to call if further questions. Vella Kohler  February 15, 2010 8:59 AM

## 2010-02-24 NOTE — Cardiovascular Report (Signed)
Summary: Office Visit Remote   Office Visit Remote   Imported By: Roderic Ovens 12/27/2009 14:55:07  _____________________________________________________________________  External Attachment:    Type:   Image     Comment:   External Document

## 2010-02-24 NOTE — Progress Notes (Signed)
Summary: Refill  Phone Note Other Incoming   Caller: pt walk into office Summary of Call: pt states for next refill on Carvedilol, he would like 12.5mg  instead of cutting 25mg /pt has already picked up RX for this mth, but would like it changed for his next refill/tg Initial call taken by: Raechel Ache [facility],  Jun 08, 2009 3:35 PM    New/Updated Medications: CARVEDILOL 12.5 MG TABS (CARVEDILOL) Take one tablet by mouth twice a day Prescriptions: CARVEDILOL 12.5 MG TABS (CARVEDILOL) Take one tablet by mouth twice a day  #60 x 3   Entered by:   Teressa Lower RN   Authorized by:   Kathlen Brunswick, MD, [facility]   Signed by:   Teressa Lower RN on 06/09/2009   Method used:   Electronically to        [facility] [phone]* (retail)       [address]       [place], [place]  [address]       Ph: [phone] or [phone]       Fax: [phone]   RxID:   [id number]

## 2010-02-24 NOTE — Miscellaneous (Signed)
Summary: LABS PT,INR,BMP,09/08/2008  Clinical Lists Changes  Observations: Added new observation of CALCIUM: 9.6 mg/dL (40/98/1191 47:82) Added new observation of CREATININE: 0.97 mg/dL (95/62/1308 65:78) Added new observation of BUN: 15 mg/dL (46/96/2952 84:13) Added new observation of BG RANDOM: 105 mg/dL (24/40/1027 25:36) Added new observation of CO2 PLSM/SER: 30 meq/L (09/18/2008 17:01) Added new observation of CL SERUM: 107 meq/L (09/18/2008 17:01) Added new observation of K SERUM: 4.8 meq/L (09/18/2008 17:01) Added new observation of NA: 140 meq/L (09/18/2008 17:01) Added new observation of INR: 1.0  (09/18/2008 17:01) Added new observation of PT PATIENT: 13.1 s (09/18/2008 17:01) Added new observation of PTT PATIENT: 32 s (09/18/2008 17:01)

## 2010-02-24 NOTE — Progress Notes (Signed)
Summary: rx rrfill  Phone Note Call from Patient Call back at Home Phone [phone]   Reason for Call: Refill Medication Summary of Call: pt needs  carvedilol 12.5mg  called in to [facility]  Initial call taken by: Faythe Ghee,  February 12, 2009 10:25 AM    Prescriptions: CARVEDILOL 25 MG TABS (CARVEDILOL) take 1/2 tab two times a day  #30 x 3   Entered by:   Teressa Lower RN   Authorized by:   Kathlen Brunswick, MD, [facility]   Signed by:   Teressa Lower RN on 02/12/2009   Method used:   Electronically to        [facility] [phone]* (retail)       [address]       [place], [place]  [address]       Ph: [phone] or [phone]       Fax: [phone]   RxID:   [id number]
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
## 2010-02-24 NOTE — Letter (Signed)
Summary: Remote Device Check  [facility], Main Office  [address]   [place], [place] [address]   Phone: [phone]  Fax: [phone]     February 24, 2009 MRN: [id number]   [name] [address] [place], [place]  [address]   Dear [name],   Your remote transmission was recieved and reviewed by your physician.  All diagnostics were within normal limits for you.  __X___Your next transmission is scheduled for: May 18, 2009.  Please transmit at any time this day.  If you have a wireless device your transmission will be sent automatically.     Sincerely,  Proofreader

## 2010-02-24 NOTE — Letter (Signed)
Summary: Remote Device Check  [facility], Main Office  [address]   [place], [place] [address]   Phone: [phone]  Fax: [phone]     December 15, 2009 MRN: [id number]   [name] [address] [place], [place]  [address]   Dear [name],   Your remote transmission was recieved and reviewed by your physician.  All diagnostics were within normal limits for you.  __X___Your next transmission is scheduled for:  02-24-2010.  Please transmit at any time this day.  If you have a wireless device your transmission will be sent automatically.   Sincerely,  Vella Kohler

## 2010-02-24 NOTE — Letter (Signed)
Summary: [facility] [employer]  [address] [place] [address]   Phone: [phone]  Fax: [phone]     February 26, 2009 MRN: [id number]   [facility] [address] [place], [place]  [address]      YOUR LAB WORK IS DUE   __________________MONDAY_______________________  Please go to [facility], located across the street from [facility] on the second floor.  Hours are Monday - Friday 7am until 7:30pm         Saturday 8am until 12noon    _x_  DO NOT EAT OR DRINK AFTER MIDNIGHT EVENING PRIOR TO LABWORK  __ YOUR LABWORK IS NOT FASTING --YOU MAY EAT PRIOR TO LABWORK
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
## 2010-02-24 NOTE — Letter (Signed)
Summary: Remote Device Check  [facility], Main Office  [address]   [place], [place] [address]   Phone: [phone]  Fax: [phone]     Jun 03, 2009 MRN: [id number]   [name] [address] [place], [place]  [address]   Dear Mr. NYBORG,   Your remote transmission was recieved and reviewed by your physician.  All diagnostics were within normal limits for you.    __X____Your next office visit is scheduled for:   July 2011. Please call our office to schedule an appointment.    Sincerely,  Vella Kohler

## 2010-02-24 NOTE — Letter (Signed)
Summary: Device-Delinquent Phone Journalist, newspaper, Main Office  [address]   [place], [place] [address]   Phone: [phone]  Fax: [phone]     November 19, 2009 MRN: [id number]   KURK CORNIEL [address] [place], [place]  [address]   Dear Mr. VERNO,  According to our records, you were scheduled for a device phone transmission on  11-11-2009.     We did not receive any results from this check.  If you transmitted on your scheduled day, please call us to help troubleshoot your system.  If you forgot to send your transmission, please send one upon receipt of this letter.  Thank you,   Architectural technologist [facility]

## 2010-02-24 NOTE — Miscellaneous (Signed)
Summary: Rehab Report  Rehab Report   Imported By: Faythe Ghee 02/01/2009 11:21:32  _____________________________________________________________________  External Attachment:    Type:   Image     Comment:   External Document

## 2010-02-24 NOTE — Cardiovascular Report (Signed)
Summary: Office Visit Remote   Office Visit Remote   Imported By: Roderic Ovens 03/01/2009 16:38:03  _____________________________________________________________________  External Attachment:    Type:   Image     Comment:   External Document

## 2010-02-24 NOTE — Miscellaneous (Signed)
Summary: labs cbcd,cmp,lipids,tsh,03/01/2009  Clinical Lists Changes  Observations: Added new observation of CALCIUM: 9.3 mg/dL (01/31/3233 5:73) Added new observation of ALBUMIN: 4.3 g/dL (22/02/5425 0:62) Added new observation of PROTEIN, TOT: 6.4 g/dL (37/62/8315 1:76) Added new observation of SGPT (ALT): 32 units/L (03/01/2009 8:54) Added new observation of SGOT (AST): 24 units/L (03/01/2009 8:54) Added new observation of ALK PHOS: 58 units/L (03/01/2009 8:54) Added new observation of CREATININE: 0.98 mg/dL (16/07/3708 6:26) Added new observation of BUN: 18 mg/dL (94/85/4627 0:35) Added new observation of BG RANDOM: 99 mg/dL (00/93/8182 9:93) Added new observation of CO2 PLSM/SER: 28 meq/L (03/01/2009 8:54) Added new observation of CL SERUM: 105 meq/L (03/01/2009 8:54) Added new observation of K SERUM: 4.7 meq/L (03/01/2009 8:54) Added new observation of NA: 141 meq/L (03/01/2009 8:54) Added new observation of LDL: 46 mg/dL (71/69/6789 3:81) Added new observation of HDL: 39 mg/dL (01/75/1025 8:52) Added new observation of TRIGLYC TOT: 73 mg/dL (77/82/4235 3:61) Added new observation of CHOLESTEROL: 100 mg/dL (44/31/5400 8:67) Added new observation of TSH: 2.119 microintl units/mL (03/01/2009 8:54) Added new observation of ABSOLUTE BAS: 0.0 K/uL (03/01/2009 8:54) Added new observation of BASOPHIL %: 0 % (03/01/2009 8:54) Added new observation of EOS ABSLT: 0.2 K/uL (03/01/2009 8:54) Added new observation of % EOS AUTO: 3 % (03/01/2009 8:54) Added new observation of ABSOLUTE MON: 0.5 K/uL (03/01/2009 8:54) Added new observation of MONOCYTE %: 7 % (03/01/2009 8:54) Added new observation of ABS LYMPHOCY: 1.7 K/uL (03/01/2009 8:54) Added new observation of LYMPHS %: 24 % (03/01/2009 8:54) Added new observation of RDW: 16.9 % (03/01/2009 8:54) Added new observation of MCHC RBC: 31.9 g/dL (61/95/0932 6:71) Added new observation of RBC M/UL: 4.94 M/uL (03/01/2009 8:54) Added new  observation of PLATELETK/UL: 162 K/uL (03/01/2009 8:54) Added new observation of MCV: 92.7 fL (03/01/2009 8:54) Added new observation of HCT: 45.8 % (03/01/2009 8:54) Added new observation of HGB: 14.6 g/dL (24/58/0998 3:38) Added new observation of WBC COUNT: 7.2 10*3/microliter (03/01/2009 8:54)

## 2010-02-24 NOTE — Progress Notes (Signed)
Summary: refill  Phone Note Call from Patient   Caller: Patient Summary of Call: pt needs refill for Carvedilol called to [facility] in [place] / pt states he has been getting 25mg  / he is only taking 12.5mg  so he is having to cut them in half / pt would for you to call in 12.5mg  so he does not have to continue cutting them/tg Initial call taken by: Raechel Ache [facility],  July 09, 2009 11:20 AM    Initial call taken by: Teressa Lower RN,  July 09, 2009 1:16 PM    Prescriptions: CARVEDILOL 12.5 MG TABS (CARVEDILOL) Take one tablet by mouth twice a day  #60 x 3   Entered by:   Teressa Lower RN   Authorized by:   Kathlen Brunswick, MD, [facility]   Signed by:   Teressa Lower RN on 07/09/2009   Method used:   Electronically to        [facility] [phone]* (retail)       [address]       [place], [place]  [address]       Ph: [phone] or [phone]       Fax: [phone]   RxID:   [id number]

## 2010-02-24 NOTE — Letter (Signed)
Summary: [place] Results Engineer, agricultural at [facility]  [address], [place] [address]   Phone: [phone]  Fax: [phone]      March 02, 2009 MRN: [id number]   KIRIL HIPPE [address] [place], [place]  [address]   Dear Mr. RAKES,  Your test ordered by Selena Batten has been reviewed by your physician (or physician assistant) and was found to be normal or stable. Your physician (or physician assistant) felt no changes were needed at this time.  ____ Echocardiogram  ____ Cardiac Stress Test  __X__ Lab Work  ____ Peripheral vascular study of arms, legs or neck  ____ CT scan or X-ray  ____ Lung or Breathing test  ____ Other: Please continue on current medical treatment.  Thank you.   Trosky Bing, MD, F.A.C.C

## 2010-02-24 NOTE — Letter (Signed)
Summary: [place] Future Lab Work Engineer, agricultural at [employer]  [address] [place] [address]   Phone: [phone]  Fax: [phone]     August 27, 2009 MRN: [id number]   [facility] [address] [place], [place]  [address]      YOUR LAB WORK IS DUE   December 27, 2009 Please go to [facility], located across the street from [facility] on the second floor.  Hours are Monday - Friday 7am until 7:30pm         Saturday 8am until 12noon    __  DO NOT EAT OR DRINK AFTER MIDNIGHT EVENING PRIOR TO LABWORK  _X_ YOUR LABWORK IS NOT FASTING --YOU MAY EAT PRIOR TO LABWORK

## 2010-02-24 NOTE — Progress Notes (Signed)
Summary: simvastatin refill  Phone Note Call from Patient   Caller: Patient Reason for Call: Refill Medication Summary of Call: pt needs refill on Simvastatin 40mg  called to [facility] in [place]/tg Initial call taken by: Raechel Ache [facility],  April 07, 2009 3:44 PM    Prescriptions: SIMVASTATIN 40 MG TABS (SIMVASTATIN) one by mouth daily  #30 x 6   Entered by:   Teressa Lower RN   Authorized by:   Kathlen Brunswick, MD, [facility]   Signed by:   Teressa Lower RN on 04/07/2009   Method used:   Electronically to        [facility] [phone]* (retail)       [address]       [place], [place]  [address]       Ph: [phone] or [phone]       Fax: [phone]   RxID:   [id number]

## 2010-03-04 ENCOUNTER — Encounter (INDEPENDENT_AMBULATORY_CARE_PROVIDER_SITE_OTHER): Payer: Self-pay | Admitting: *Deleted

## 2010-03-10 NOTE — Letter (Signed)
Summary: Remote Device Check  [facility], Main Office  [address]   [place], [place] [address]   Phone: [phone]  Fax: [phone]     March 04, 2010 MRN: [id number]   [name] [address] [place], [place]  [address]   Dear [name],   Your remote transmission was recieved and reviewed by your physician.  All diagnostics were within normal limits for you.  _____Your next transmission is scheduled for:                       .  Please transmit at any time this day.  If you have a wireless device your transmission will be sent automatically.  ____X__Your next office visit is scheduled for:  July, 2012, with Dr. Ladona Ridgel.                            . Please call our office to schedule an appointment.    Sincerely,  Altha Harm, LPN

## 2010-03-16 NOTE — Cardiovascular Report (Signed)
Summary: Office Visit Remote   Office Visit Remote   Imported By: Roderic Ovens 03/07/2010 10:08:50  _____________________________________________________________________  External Attachment:    Type:   Image     Comment:   External Document

## 2010-04-29 LAB — BASIC METABOLIC PANEL
BUN: 19 mg/dL (ref 6–23)
BUN: 13 mg/dL (ref 6–23)
BUN: 15 mg/dL (ref 6–23)
BUN: 16 mg/dL (ref 6–23)
CO2: 31 mEq/L (ref 19–32)
CO2: 22 mEq/L (ref 19–32)
CO2: 25 mEq/L (ref 19–32)
CO2: 30 mEq/L (ref 19–32)
Calcium: 8.3 mg/dL — ABNORMAL LOW (ref 8.4–10.5)
Calcium: 7.4 mg/dL — ABNORMAL LOW (ref 8.4–10.5)
Calcium: 7.5 mg/dL — ABNORMAL LOW (ref 8.4–10.5)
Calcium: 7.8 mg/dL — ABNORMAL LOW (ref 8.4–10.5)
Calcium: 7.8 mg/dL — ABNORMAL LOW (ref 8.4–10.5)
Chloride: 97 mEq/L (ref 96–112)
Chloride: 100 mEq/L (ref 96–112)
Chloride: 108 mEq/L (ref 96–112)
Creatinine, Ser: 1.05 mg/dL (ref 0.4–1.5)
Creatinine, Ser: 0.91 mg/dL (ref 0.4–1.5)
Creatinine, Ser: 0.92 mg/dL (ref 0.4–1.5)
GFR calc Af Amer: >60 mL/min (ref >60)
GFR calc Af Amer: >60 mL/min (ref >60)
GFR calc Af Amer: >60 mL/min (ref >60)
GFR calc Af Amer: >60 mL/min (ref >60)
GFR calc non Af Amer: >60 mL/min (ref >60)
GFR calc non Af Amer: >60 mL/min (ref >60)
GFR calc non Af Amer: >60 mL/min (ref >60)
GFR calc non Af Amer: >60 mL/min (ref >60)
Glucose, Bld: 115 mg/dL — ABNORMAL HIGH (ref 70–99)
Glucose, Bld: 103 mg/dL — ABNORMAL HIGH (ref 70–99)
Glucose, Bld: 107 mg/dL — ABNORMAL HIGH (ref 70–99)
Glucose, Bld: 158 mg/dL — ABNORMAL HIGH (ref 70–99)
Potassium: 3.6 mEq/L (ref 3.5–5.1)
Potassium: 4.1 mEq/L (ref 3.5–5.1)
Potassium: 4.1 mEq/L (ref 3.5–5.1)
Potassium: 4.3 mEq/L (ref 3.5–5.1)
Sodium: 135 mEq/L (ref 135–145)
Sodium: 131 mEq/L — ABNORMAL LOW (ref 135–145)
Sodium: 135 mEq/L (ref 135–145)
Sodium: 137 mEq/L (ref 135–145)
Sodium: 138 mEq/L (ref 135–145)

## 2010-04-29 LAB — GLUCOSE, CAPILLARY
Glucose-Capillary: 101 mg/dL — ABNORMAL HIGH (ref 70–99)
Glucose-Capillary: 105 mg/dL — ABNORMAL HIGH (ref 70–99)
Glucose-Capillary: 106 mg/dL — ABNORMAL HIGH (ref 70–99)
Glucose-Capillary: 111 mg/dL — ABNORMAL HIGH (ref 70–99)
Glucose-Capillary: 121 mg/dL — ABNORMAL HIGH (ref 70–99)
Glucose-Capillary: 123 mg/dL — ABNORMAL HIGH (ref 70–99)
Glucose-Capillary: 134 mg/dL — ABNORMAL HIGH (ref 70–99)
Glucose-Capillary: 143 mg/dL — ABNORMAL HIGH (ref 70–99)
Glucose-Capillary: 144 mg/dL — ABNORMAL HIGH (ref 70–99)
Glucose-Capillary: 145 mg/dL — ABNORMAL HIGH (ref 70–99)
Glucose-Capillary: 149 mg/dL — ABNORMAL HIGH (ref 70–99)
Glucose-Capillary: 153 mg/dL — ABNORMAL HIGH (ref 70–99)
Glucose-Capillary: 178 mg/dL — ABNORMAL HIGH (ref 70–99)
Glucose-Capillary: 184 mg/dL — ABNORMAL HIGH (ref 70–99)
Glucose-Capillary: 100 mg/dL — ABNORMAL HIGH (ref 70–99)
Glucose-Capillary: 101 mg/dL — ABNORMAL HIGH (ref 70–99)
Glucose-Capillary: 104 mg/dL — ABNORMAL HIGH (ref 70–99)
Glucose-Capillary: 104 mg/dL — ABNORMAL HIGH (ref 70–99)
Glucose-Capillary: 107 mg/dL — ABNORMAL HIGH (ref 70–99)
Glucose-Capillary: 109 mg/dL — ABNORMAL HIGH (ref 70–99)
Glucose-Capillary: 112 mg/dL — ABNORMAL HIGH (ref 70–99)
Glucose-Capillary: 113 mg/dL — ABNORMAL HIGH (ref 70–99)
Glucose-Capillary: 116 mg/dL — ABNORMAL HIGH (ref 70–99)
Glucose-Capillary: 121 mg/dL — ABNORMAL HIGH (ref 70–99)
Glucose-Capillary: 123 mg/dL — ABNORMAL HIGH (ref 70–99)
Glucose-Capillary: 126 mg/dL — ABNORMAL HIGH (ref 70–99)
Glucose-Capillary: 132 mg/dL — ABNORMAL HIGH (ref 70–99)
Glucose-Capillary: 132 mg/dL — ABNORMAL HIGH (ref 70–99)
Glucose-Capillary: 134 mg/dL — ABNORMAL HIGH (ref 70–99)
Glucose-Capillary: 136 mg/dL — ABNORMAL HIGH (ref 70–99)
Glucose-Capillary: 142 mg/dL — ABNORMAL HIGH (ref 70–99)
Glucose-Capillary: 143 mg/dL — ABNORMAL HIGH (ref 70–99)
Glucose-Capillary: 151 mg/dL — ABNORMAL HIGH (ref 70–99)
Glucose-Capillary: 155 mg/dL — ABNORMAL HIGH (ref 70–99)
Glucose-Capillary: 157 mg/dL — ABNORMAL HIGH (ref 70–99)
Glucose-Capillary: 160 mg/dL — ABNORMAL HIGH (ref 70–99)
Glucose-Capillary: 161 mg/dL — ABNORMAL HIGH (ref 70–99)
Glucose-Capillary: 168 mg/dL — ABNORMAL HIGH (ref 70–99)
Glucose-Capillary: 170 mg/dL — ABNORMAL HIGH (ref 70–99)
Glucose-Capillary: 183 mg/dL — ABNORMAL HIGH (ref 70–99)
Glucose-Capillary: 94 mg/dL (ref 70–99)
Glucose-Capillary: 98 mg/dL (ref 70–99)

## 2010-04-29 LAB — COMPREHENSIVE METABOLIC PANEL
ALT: 30 U/L (ref 0–53)
AST: 28 U/L (ref 0–37)
Albumin: 3.9 g/dL (ref 3.5–5.2)
Alkaline Phosphatase: 60 U/L (ref 39–117)
BUN: 12 mg/dL (ref 6–23)
CO2: 22 mEq/L (ref 19–32)
Calcium: 9 mg/dL (ref 8.4–10.5)
Chloride: 109 mEq/L (ref 96–112)
Creatinine, Ser: 0.91 mg/dL (ref 0.4–1.5)
GFR calc Af Amer: >60 mL/min (ref >60)
GFR calc non Af Amer: >60 mL/min (ref >60)
Glucose, Bld: 114 mg/dL — ABNORMAL HIGH (ref 70–99)
Potassium: 4.9 mEq/L (ref 3.5–5.1)
Sodium: 139 mEq/L (ref 135–145)
Total Bilirubin: 0.7 mg/dL (ref 0.3–1.2)
Total Protein: 6.4 g/dL (ref 6.0–8.3)

## 2010-04-29 LAB — BLOOD GAS, ARTERIAL
Acid-Base Excess: 1.4 mmol/L (ref 0.0–2.0)
Bicarbonate: 25.5 mEq/L — ABNORMAL HIGH (ref 20.0–24.0)
FIO2: 0.21 %
O2 Saturation: 97.6 %
Patient temperature: 98.6
TCO2: 26.7 mmol/L (ref 0–100)
pCO2 arterial: 40.5 mmHg (ref 35.0–45.0)
pH, Arterial: 7.415 (ref 7.350–7.450)
pO2, Arterial: 88.8 mmHg (ref 80.0–100.0)

## 2010-04-29 LAB — CBC
HCT: 26.5 % — ABNORMAL LOW (ref 39.0–52.0)
HCT: 23 % — ABNORMAL LOW (ref 39.0–52.0)
HCT: 26 % — ABNORMAL LOW (ref 39.0–52.0)
HCT: 26.5 % — ABNORMAL LOW (ref 39.0–52.0)
HCT: 28.2 % — ABNORMAL LOW (ref 39.0–52.0)
HCT: 28.7 % — ABNORMAL LOW (ref 39.0–52.0)
HCT: 30.8 % — ABNORMAL LOW (ref 39.0–52.0)
HCT: 42.2 % (ref 39.0–52.0)
Hemoglobin: 8.8 g/dL — ABNORMAL LOW (ref 13.0–17.0)
Hemoglobin: 10.4 g/dL — ABNORMAL LOW (ref 13.0–17.0)
Hemoglobin: 14.4 g/dL (ref 13.0–17.0)
Hemoglobin: 7.8 g/dL — CL (ref 13.0–17.0)
Hemoglobin: 8.8 g/dL — ABNORMAL LOW (ref 13.0–17.0)
Hemoglobin: 8.9 g/dL — ABNORMAL LOW (ref 13.0–17.0)
Hemoglobin: 9.1 g/dL — ABNORMAL LOW (ref 13.0–17.0)
Hemoglobin: 9.7 g/dL — ABNORMAL LOW (ref 13.0–17.0)
Hemoglobin: 9.8 g/dL — ABNORMAL LOW (ref 13.0–17.0)
MCHC: 33.2 g/dL (ref 30.0–36.0)
MCHC: 33.9 g/dL (ref 30.0–36.0)
MCHC: 33.9 g/dL (ref 30.0–36.0)
MCHC: 33.9 g/dL (ref 30.0–36.0)
MCHC: 34 g/dL (ref 30.0–36.0)
MCHC: 34.1 g/dL (ref 30.0–36.0)
MCHC: 34.5 g/dL (ref 30.0–36.0)
MCHC: 35 g/dL (ref 30.0–36.0)
MCV: 94.4 fL (ref 78.0–100.0)
MCV: 92.6 fL (ref 78.0–100.0)
MCV: 93.2 fL (ref 78.0–100.0)
MCV: 93.9 fL (ref 78.0–100.0)
MCV: 93.9 fL (ref 78.0–100.0)
MCV: 93.9 fL (ref 78.0–100.0)
MCV: 94.1 fL (ref 78.0–100.0)
Platelets: 167 10*3/uL (ref 150–400)
Platelets: 174 10*3/uL (ref 150–400)
Platelets: 74 10*3/uL — ABNORMAL LOW (ref 150–400)
Platelets: 79 10*3/uL — ABNORMAL LOW (ref 150–400)
Platelets: 80 10*3/uL — ABNORMAL LOW (ref 150–400)
Platelets: 87 10*3/uL — ABNORMAL LOW (ref 150–400)
Platelets: 94 10*3/uL — ABNORMAL LOW (ref 150–400)
Platelets: 96 10*3/uL — ABNORMAL LOW (ref 150–400)
RBC: 2.81 MIL/uL — ABNORMAL LOW (ref 4.22–5.81)
RBC: 2.45 MIL/uL — ABNORMAL LOW (ref 4.22–5.81)
RBC: 2.8 MIL/uL — ABNORMAL LOW (ref 4.22–5.81)
RBC: 2.87 MIL/uL — ABNORMAL LOW (ref 4.22–5.81)
RBC: 3.02 MIL/uL — ABNORMAL LOW (ref 4.22–5.81)
RBC: 3.06 MIL/uL — ABNORMAL LOW (ref 4.22–5.81)
RBC: 3.27 MIL/uL — ABNORMAL LOW (ref 4.22–5.81)
RBC: 4.49 MIL/uL (ref 4.22–5.81)
RDW: 14.9 % (ref 11.5–15.5)
RDW: 13.5 % (ref 11.5–15.5)
RDW: 13.6 % (ref 11.5–15.5)
RDW: 13.6 % (ref 11.5–15.5)
RDW: 13.8 % (ref 11.5–15.5)
RDW: 14.6 % (ref 11.5–15.5)
RDW: 14.8 % (ref 11.5–15.5)
RDW: 14.9 % (ref 11.5–15.5)
RDW: 15.1 % (ref 11.5–15.5)
WBC: 9.7 10*3/uL (ref 4.0–10.5)
WBC: 10 10*3/uL (ref 4.0–10.5)
WBC: 10.1 10*3/uL (ref 4.0–10.5)
WBC: 10.5 10*3/uL (ref 4.0–10.5)
WBC: 12.3 10*3/uL — ABNORMAL HIGH (ref 4.0–10.5)
WBC: 14.5 10*3/uL — ABNORMAL HIGH (ref 4.0–10.5)
WBC: 7.6 10*3/uL (ref 4.0–10.5)
WBC: 9.4 10*3/uL (ref 4.0–10.5)

## 2010-04-29 LAB — CREATININE, SERUM
Creatinine, Ser: 0.83 mg/dL (ref 0.4–1.5)
Creatinine, Ser: 0.99 mg/dL (ref 0.4–1.5)
GFR calc Af Amer: >60 mL/min (ref >60)
GFR calc Af Amer: >60 mL/min (ref >60)
GFR calc non Af Amer: >60 mL/min (ref >60)
GFR calc non Af Amer: >60 mL/min (ref >60)

## 2010-04-29 LAB — URINALYSIS, ROUTINE W REFLEX MICROSCOPIC
Bilirubin Urine: NEGATIVE
Glucose, UA: NEGATIVE mg/dL
Hgb urine dipstick: NEGATIVE
Ketones, ur: NEGATIVE mg/dL
Nitrite: NEGATIVE
Protein, ur: NEGATIVE mg/dL
Specific Gravity, Urine: 1.012 (ref 1.005–1.030)
Urobilinogen, UA: 0.2 mg/dL (ref 0.0–1.0)
pH: 6 (ref 5.0–8.0)

## 2010-04-29 LAB — POCT I-STAT 3, ART BLOOD GAS (G3+)
Acid-Base Excess: 1 mmol/L (ref 0.0–2.0)
Acid-base deficit: 1 mmol/L (ref 0.0–2.0)
Acid-base deficit: 3 mmol/L — ABNORMAL HIGH (ref 0.0–2.0)
Bicarbonate: 23.1 mEq/L (ref 20.0–24.0)
Bicarbonate: 24.7 mEq/L — ABNORMAL HIGH (ref 20.0–24.0)
Bicarbonate: 26.2 mEq/L — ABNORMAL HIGH (ref 20.0–24.0)
O2 Saturation: 100 %
O2 Saturation: 100 %
O2 Saturation: 97 %
Patient temperature: 37
TCO2: 24 mmol/L (ref 0–100)
TCO2: 26 mmol/L (ref 0–100)
TCO2: 28 mmol/L (ref 0–100)
pCO2 arterial: 42.1 mmHg (ref 35.0–45.0)
pCO2 arterial: 44.3 mmHg (ref 35.0–45.0)
pCO2 arterial: 45.4 mmHg — ABNORMAL HIGH (ref 35.0–45.0)
pH, Arterial: 7.314 — ABNORMAL LOW (ref 7.350–7.450)
pH, Arterial: 7.376 (ref 7.350–7.450)
pH, Arterial: 7.38 (ref 7.350–7.450)
pO2, Arterial: 209 mmHg — ABNORMAL HIGH (ref 80.0–100.0)
pO2, Arterial: 290 mmHg — ABNORMAL HIGH (ref 80.0–100.0)
pO2, Arterial: 89 mmHg (ref 80.0–100.0)

## 2010-04-29 LAB — POCT I-STAT, CHEM 8
BUN: 13 mg/dL (ref 6–23)
BUN: 17 mg/dL (ref 6–23)
Calcium, Ion: 1.14 mmol/L (ref 1.12–1.32)
Calcium, Ion: 1.16 mmol/L (ref 1.12–1.32)
Chloride: 100 mEq/L (ref 96–112)
Chloride: 107 mEq/L (ref 96–112)
Creatinine, Ser: 0.9 mg/dL (ref 0.4–1.5)
Creatinine, Ser: 1 mg/dL (ref 0.4–1.5)
Glucose, Bld: 135 mg/dL — ABNORMAL HIGH (ref 70–99)
Glucose, Bld: 154 mg/dL — ABNORMAL HIGH (ref 70–99)
HCT: 26 % — ABNORMAL LOW (ref 39.0–52.0)
HCT: 28 % — ABNORMAL LOW (ref 39.0–52.0)
Hemoglobin: 8.8 g/dL — ABNORMAL LOW (ref 13.0–17.0)
Hemoglobin: 9.5 g/dL — ABNORMAL LOW (ref 13.0–17.0)
Potassium: 3.9 mEq/L (ref 3.5–5.1)
Potassium: 4.5 mEq/L (ref 3.5–5.1)
Sodium: 136 mEq/L (ref 135–145)
Sodium: 139 mEq/L (ref 135–145)
TCO2: 21 mmol/L (ref 0–100)
TCO2: 25 mmol/L (ref 0–100)

## 2010-04-29 LAB — POCT I-STAT 4, (NA,K, GLUC, HGB,HCT)
Glucose, Bld: 103 mg/dL — ABNORMAL HIGH (ref 70–99)
Glucose, Bld: 113 mg/dL — ABNORMAL HIGH (ref 70–99)
Glucose, Bld: 115 mg/dL — ABNORMAL HIGH (ref 70–99)
Glucose, Bld: 121 mg/dL — ABNORMAL HIGH (ref 70–99)
Glucose, Bld: 166 mg/dL — ABNORMAL HIGH (ref 70–99)
HCT: 25 % — ABNORMAL LOW (ref 39.0–52.0)
HCT: 28 % — ABNORMAL LOW (ref 39.0–52.0)
HCT: 28 % — ABNORMAL LOW (ref 39.0–52.0)
HCT: 34 % — ABNORMAL LOW (ref 39.0–52.0)
HCT: 36 % — ABNORMAL LOW (ref 39.0–52.0)
Hemoglobin: 11.6 g/dL — ABNORMAL LOW (ref 13.0–17.0)
Hemoglobin: 12.2 g/dL — ABNORMAL LOW (ref 13.0–17.0)
Hemoglobin: 8.5 g/dL — ABNORMAL LOW (ref 13.0–17.0)
Hemoglobin: 9.5 g/dL — ABNORMAL LOW (ref 13.0–17.0)
Hemoglobin: 9.5 g/dL — ABNORMAL LOW (ref 13.0–17.0)
Potassium: 4 mEq/L (ref 3.5–5.1)
Potassium: 4.1 mEq/L (ref 3.5–5.1)
Potassium: 4.3 mEq/L (ref 3.5–5.1)
Potassium: 4.5 mEq/L (ref 3.5–5.1)
Potassium: 5.8 mEq/L — ABNORMAL HIGH (ref 3.5–5.1)
Sodium: 136 mEq/L (ref 135–145)
Sodium: 136 mEq/L (ref 135–145)
Sodium: 140 mEq/L (ref 135–145)
Sodium: 141 mEq/L (ref 135–145)
Sodium: 142 mEq/L (ref 135–145)

## 2010-04-29 LAB — HEMOGLOBIN AND HEMATOCRIT, BLOOD
HCT: 28.8 % — ABNORMAL LOW (ref 39.0–52.0)
Hemoglobin: 9.8 g/dL — ABNORMAL LOW (ref 13.0–17.0)

## 2010-04-29 LAB — MRSA PCR SCREENING: MRSA by PCR: NEGATIVE

## 2010-04-29 LAB — TYPE AND SCREEN
ABO/RH(D): A NEG
Antibody Screen: NEGATIVE

## 2010-04-29 LAB — PROTIME-INR
INR: 1 (ref 0.00–1.49)
INR: 1.7 — ABNORMAL HIGH (ref 0.00–1.49)
Prothrombin Time: 13.1 seconds (ref 11.6–15.2)
Prothrombin Time: 19.6 seconds — ABNORMAL HIGH (ref 11.6–15.2)

## 2010-04-29 LAB — BRAIN NATRIURETIC PEPTIDE: Pro B Natriuretic peptide (BNP): 289 pg/mL — ABNORMAL HIGH (ref 0.0–100.0)

## 2010-04-29 LAB — MAGNESIUM
Magnesium: 1.9 mg/dL (ref 1.5–2.5)
Magnesium: 2.1 mg/dL (ref 1.5–2.5)
Magnesium: 2.3 mg/dL (ref 1.5–2.5)

## 2010-04-29 LAB — PLATELET COUNT: Platelets: 109 10*3/uL — ABNORMAL LOW (ref 150–400)

## 2010-04-29 LAB — CK TOTAL AND CKMB (NOT AT ARMC)
CK, MB: 13.6 ng/mL — ABNORMAL HIGH (ref 0.3–4.0)
Relative Index: 4.8 — ABNORMAL HIGH (ref 0.0–2.5)
Total CK: 285 U/L — ABNORMAL HIGH (ref 7–232)

## 2010-04-29 LAB — APTT
aPTT: 32 seconds (ref 24–37)
aPTT: 40 seconds — ABNORMAL HIGH (ref 24–37)

## 2010-04-29 LAB — HEMOGLOBIN A1C
Hgb A1c MFr Bld: 6.5 % — ABNORMAL HIGH (ref 4.6–6.1)
Mean Plasma Glucose: 140 mg/dL

## 2010-04-29 LAB — ABO/RH: ABO/RH(D): A NEG

## 2010-04-30 LAB — POCT I-STAT 3, ART BLOOD GAS (G3+)
Acid-base deficit: 1 mmol/L (ref 0.0–2.0)
pCO2 arterial: 43 mmHg (ref 35.0–45.0)
pO2, Arterial: 63 mmHg — ABNORMAL LOW (ref 80.0–100.0)

## 2010-04-30 LAB — POCT I-STAT 3, VENOUS BLOOD GAS (G3P V)
Acid-base deficit: 2 mmol/L (ref 0.0–2.0)
Bicarbonate: 24.6 mEq/L — ABNORMAL HIGH (ref 20.0–24.0)

## 2010-05-03 ENCOUNTER — Other Ambulatory Visit: Payer: Self-pay | Admitting: Cardiology

## 2010-05-04 ENCOUNTER — Other Ambulatory Visit: Payer: Self-pay

## 2010-05-04 ENCOUNTER — Telehealth: Payer: Self-pay | Admitting: Cardiology

## 2010-05-04 MED ORDER — SIMVASTATIN 40 MG PO TABS: 40.0000 mg | ORAL_TABLET | Freq: Every day | ORAL | Status: DC

## 2010-05-04 NOTE — Telephone Encounter (Signed)
Needs refill on Simvastatin called to [facility] in [place] / tg

## 2010-05-09 LAB — CARDIAC PANEL(CRET KIN+CKTOT+MB+TROPI)
CK, MB: 1.5 ng/mL (ref 0.3–4.0)
CK, MB: 1.7 ng/mL (ref 0.3–4.0)
Relative Index: 1.6 (ref 0.0–2.5)
Total CK: 103 U/L (ref 7–232)
Total CK: 106 U/L (ref 7–232)
Troponin I: 0.04 ng/mL (ref 0.00–0.06)
Troponin I: 0.04 ng/mL (ref 0.00–0.06)
Troponin I: 0.06 ng/mL (ref 0.00–0.06)

## 2010-05-09 LAB — MAGNESIUM: Magnesium: 2.3 mg/dL (ref 1.5–2.5)

## 2010-05-09 LAB — DIFFERENTIAL
Basophils Absolute: 0 10*3/uL (ref 0.0–0.1)
Basophils Relative: 0 % (ref 0–1)
Basophils Relative: 0 % (ref 0–1)
Lymphocytes Relative: 13 % (ref 12–46)
Lymphocytes Relative: 19 % (ref 12–46)
Lymphs Abs: 1.2 10*3/uL (ref 0.7–4.0)
Lymphs Abs: 1.4 10*3/uL (ref 0.7–4.0)
Monocytes Absolute: 0.7 10*3/uL (ref 0.1–1.0)
Monocytes Absolute: 0.7 10*3/uL (ref 0.1–1.0)
Monocytes Absolute: 1 10*3/uL (ref 0.1–1.0)
Monocytes Relative: 10 % (ref 3–12)
Monocytes Relative: 11 % (ref 3–12)
Neutro Abs: 4.9 10*3/uL (ref 1.7–7.7)
Neutro Abs: 6.1 10*3/uL (ref 1.7–7.7)
Neutro Abs: 6.7 10*3/uL (ref 1.7–7.7)
Neutrophils Relative %: 71 % (ref 43–77)

## 2010-05-09 LAB — LIPID PANEL
Cholesterol: 90 mg/dL (ref 0–200)
HDL: 23 mg/dL — ABNORMAL LOW (ref >39)
HDL: 25 mg/dL — ABNORMAL LOW (ref >39)
LDL Cholesterol: 54 mg/dL (ref 0–99)
Total CHOL/HDL Ratio: 3.6 RATIO
Triglycerides: 57 mg/dL (ref <150)
VLDL: 12 mg/dL (ref 0–40)

## 2010-05-09 LAB — BASIC METABOLIC PANEL
BUN: 12 mg/dL (ref 6–23)
BUN: 14 mg/dL (ref 6–23)
CO2: 27 mEq/L (ref 19–32)
CO2: 28 mEq/L (ref 19–32)
CO2: 29 mEq/L (ref 19–32)
Calcium: 8.7 mg/dL (ref 8.4–10.5)
Calcium: 8.8 mg/dL (ref 8.4–10.5)
Calcium: 9 mg/dL (ref 8.4–10.5)
Chloride: 104 mEq/L (ref 96–112)
Chloride: 99 mEq/L (ref 96–112)
Creatinine, Ser: 0.9 mg/dL (ref 0.4–1.5)
Creatinine, Ser: 0.93 mg/dL (ref 0.4–1.5)
Creatinine, Ser: 1.01 mg/dL (ref 0.4–1.5)
GFR calc Af Amer: >60 mL/min (ref >60)
GFR calc non Af Amer: >60 mL/min (ref >60)
GFR calc non Af Amer: >60 mL/min (ref >60)
Glucose, Bld: 110 mg/dL — ABNORMAL HIGH (ref 70–99)
Glucose, Bld: 127 mg/dL — ABNORMAL HIGH (ref 70–99)
Glucose, Bld: 137 mg/dL — ABNORMAL HIGH (ref 70–99)
Potassium: 4.4 mEq/L (ref 3.5–5.1)
Sodium: 132 mEq/L — ABNORMAL LOW (ref 135–145)
Sodium: 138 mEq/L (ref 135–145)

## 2010-05-09 LAB — CBC
HCT: 40.1 % (ref 39.0–52.0)
Hemoglobin: 13.4 g/dL (ref 13.0–17.0)
Hemoglobin: 13.4 g/dL (ref 13.0–17.0)
MCHC: 33.3 g/dL (ref 30.0–36.0)
MCHC: 33.3 g/dL (ref 30.0–36.0)
MCHC: 33.6 g/dL (ref 30.0–36.0)
MCV: 93.9 fL (ref 78.0–100.0)
MCV: 94.3 fL (ref 78.0–100.0)
Platelets: 176 10*3/uL (ref 150–400)
Platelets: 219 10*3/uL (ref 150–400)
RBC: 4.26 MIL/uL (ref 4.22–5.81)
RBC: 4.27 MIL/uL (ref 4.22–5.81)
RDW: 12.9 % (ref 11.5–15.5)
RDW: 13.2 % (ref 11.5–15.5)
WBC: 7.2 10*3/uL (ref 4.0–10.5)
WBC: 9 10*3/uL (ref 4.0–10.5)

## 2010-05-09 LAB — URINALYSIS, ROUTINE W REFLEX MICROSCOPIC
Hgb urine dipstick: NEGATIVE
Specific Gravity, Urine: 1.01 (ref 1.005–1.030)
Urobilinogen, UA: 0.2 mg/dL (ref 0.0–1.0)

## 2010-05-09 LAB — POCT CARDIAC MARKERS
CKMB, poc: 1.6 ng/mL (ref 1.0–8.0)
CKMB, poc: 1.6 ng/mL (ref 1.0–8.0)
Myoglobin, poc: 119 ng/mL (ref 12–200)
Myoglobin, poc: 97.8 ng/mL (ref 12–200)
Troponin i, poc: <0.05 ng/mL (ref 0.00–0.09)

## 2010-05-09 LAB — PROTIME-INR
INR: 1.1 (ref 0.00–1.49)
Prothrombin Time: 14.9 seconds (ref 11.6–15.2)

## 2010-05-09 LAB — APTT: aPTT: 39 seconds — ABNORMAL HIGH (ref 24–37)

## 2010-05-09 LAB — PHOSPHORUS: Phosphorus: 3.3 mg/dL (ref 2.3–4.6)

## 2010-05-10 LAB — BLOOD GAS, ARTERIAL
Bicarbonate: 24.2 mEq/L — ABNORMAL HIGH (ref 20.0–24.0)
O2 Content: 21 L/min
O2 Saturation: 94.4 %
pH, Arterial: 7.397 (ref 7.350–7.450)

## 2010-06-03 ENCOUNTER — Encounter: Payer: Self-pay | Admitting: Cardiology

## 2010-06-03 ENCOUNTER — Encounter: Payer: Self-pay | Admitting: *Deleted

## 2010-06-03 ENCOUNTER — Ambulatory Visit (INDEPENDENT_AMBULATORY_CARE_PROVIDER_SITE_OTHER): Payer: Medicare Other | Admitting: Cardiology

## 2010-06-03 DIAGNOSIS — I359 Nonrheumatic aortic valve disorder, unspecified: Secondary | ICD-10-CM

## 2010-06-03 DIAGNOSIS — I350 Nonrheumatic aortic (valve) stenosis: Secondary | ICD-10-CM | POA: Insufficient documentation

## 2010-06-03 DIAGNOSIS — I219 Acute myocardial infarction, unspecified: Secondary | ICD-10-CM | POA: Insufficient documentation

## 2010-06-03 DIAGNOSIS — Z720 Tobacco use: Secondary | ICD-10-CM

## 2010-06-03 DIAGNOSIS — I10 Essential (primary) hypertension: Secondary | ICD-10-CM | POA: Insufficient documentation

## 2010-06-03 DIAGNOSIS — R55 Syncope and collapse: Secondary | ICD-10-CM

## 2010-06-03 DIAGNOSIS — R5383 Other fatigue: Secondary | ICD-10-CM

## 2010-06-03 DIAGNOSIS — K859 Acute pancreatitis without necrosis or infection, unspecified: Secondary | ICD-10-CM | POA: Insufficient documentation

## 2010-06-03 DIAGNOSIS — J449 Chronic obstructive pulmonary disease, unspecified: Secondary | ICD-10-CM

## 2010-06-03 DIAGNOSIS — Z95810 Presence of automatic (implantable) cardiac defibrillator: Secondary | ICD-10-CM | POA: Insufficient documentation

## 2010-06-03 DIAGNOSIS — E785 Hyperlipidemia, unspecified: Secondary | ICD-10-CM | POA: Insufficient documentation

## 2010-06-03 DIAGNOSIS — F17201 Nicotine dependence, unspecified, in remission: Secondary | ICD-10-CM | POA: Insufficient documentation

## 2010-06-03 DIAGNOSIS — R0609 Other forms of dyspnea: Secondary | ICD-10-CM

## 2010-06-03 DIAGNOSIS — R0600 Dyspnea, unspecified: Secondary | ICD-10-CM

## 2010-06-03 DIAGNOSIS — R5381 Other malaise: Secondary | ICD-10-CM

## 2010-06-03 DIAGNOSIS — R0989 Other specified symptoms and signs involving the circulatory and respiratory systems: Secondary | ICD-10-CM | POA: Insufficient documentation

## 2010-06-03 DIAGNOSIS — F17200 Nicotine dependence, unspecified, uncomplicated: Secondary | ICD-10-CM

## 2010-06-03 DIAGNOSIS — I2510 Atherosclerotic heart disease of native coronary artery without angina pectoris: Secondary | ICD-10-CM

## 2010-06-03 NOTE — Assessment & Plan Note (Addendum)
Exam suggests the presence of aortic stenosis, but bioprosthetic device functioned normally immediately postoperative in 2010.  I would not expect any significant impairment of function to have developed within 2 years.  A repeat echocardiogram will be obtained to exclude this possibility.

## 2010-06-03 NOTE — Assessment & Plan Note (Signed)
Patient is congratulated on having refrain from smoking for more than 2 years and advised that this will go a long way towards improving his functional status.

## 2010-06-03 NOTE — Progress Notes (Signed)
HPI : Mr. Coston is seen in the office as scheduled for continued assessment and treatment of valvular heart disease and coronary artery disease.  He is now more than 2 years out from a combined CABG/AVR procedure.  He continues to complain of dyspnea with mild to moderate exertion, noting these symptoms when walking a few 100 feet on flat ground.  Shortness of breath resolves rapidly with rest.  Current Outpatient Prescriptions on File Prior to Visit  Medication Sig Dispense Refill  . acetaminophen (TYLENOL) 500 MG tablet Take 500 mg by mouth every 6 (six) hours as needed.        Marland Kitchen aspirin 81 MG tablet Take 81 mg by mouth daily.        . carvedilol (COREG) 12.5 MG tablet Take 12.5 mg by mouth 2 (two) times daily with a meal.        . ipratropium (ATROVENT HFA) 17 MCG/ACT inhaler Inhale 2 puffs into the lungs every 6 (six) hours.        Marland Kitchen levalbuterol (XOPENEX HFA) 45 MCG/ACT inhaler Inhale 1-2 puffs into the lungs every 4 (four) hours as needed.        . nitroGLYCERIN (NITROSTAT) 0.4 MG SL tablet Place 0.4 mg under the tongue every 5 (five) minutes as needed.        . simvastatin (ZOCOR) 40 MG tablet TAKE ONE TABLET BY MOUTH EVERY DAY  30 tablet  4     Allergies  Allergen Reactions  . Cefazolin     REACTION: rash      Past medical history, social history, and family history reviewed and updated.  ROS: Denies orthopnea, PND, lightheadedness, or syncope; mild pedal edema noted intermittently, particularly of the left ankle.  PHYSICAL EXAM: BP 133/70  Pulse 51  Ht 5\' 7"  (1.702 m)  Wt 203 lb (92.08 kg)  BMI 31.79 kg/m2  SpO2 95%  General-Well developed; no acute distress Body habitus-overweight Neck-No JVD; no carotid bruits-transmitted murmur bilaterally Lungs-clear lung fields; resonant to percussion Cardiovascular-normal PMI; normal S1 and S2; grade 2/6 mid peaking systolic ejection murmur with wheezing quality Abdomen-normal bowel sounds; soft and non-tender without masses or  organomegaly Musculoskeletal-No deformities, no cyanosis or clubbing Neurologic-Normal cranial nerves; symmetric strength and tone Skin-Warm, no significant lesions Extremities-distal pulses intact; trace edema  ASSESSMENT AND PLAN:

## 2010-06-03 NOTE — Assessment & Plan Note (Signed)
Device has never discharged and continues to be followed by Dr. Ladona Ridgel at appropriate intervals.

## 2010-06-03 NOTE — Assessment & Plan Note (Addendum)
Based upon PFTs, it is unlikely that chronic lung disease is contributing to exertional dyspnea.  Patient already has an inhaler for albuterol and will use this either prophylactically or immediately following the onset of symptoms to determine if it offers any benefit.

## 2010-06-03 NOTE — Assessment & Plan Note (Signed)
Dyslipidemia has not been evaluated for some time; most recent results of fasting lipid profile approximately one year ago were excellent; a repeat study will be obtained.

## 2010-06-03 NOTE — Assessment & Plan Note (Signed)
Chronic obstructive pulmonary disease could be contributing to the patient's exertional symptoms.  PFTs for 2010 were obtained and reviewed.  These demonstrated only mild obstructive changes without any effect of bronchodilator.

## 2010-06-03 NOTE — Patient Instructions (Signed)
Your physician recommends that you schedule a follow-up appointment in: 1 year Your physician recommends that you return for lab work in: Monday Your physician has requested that you have an echocardiogram. Echocardiography is a painless test that uses sound waves to create images of your heart. It provides your doctor with information about the size and shape of your heart and how well your heart's chambers and valves are working. This procedure takes approximately one hour. There are no restrictions for this procedure.  A chest x-ray takes a picture of the organs and structures inside the chest, including the heart, lungs, and blood vessels. This test can show several things, including, whether the heart is enlarges; whether fluid is building up in the lungs; and whether pacemaker / defibrillator leads are still in place.

## 2010-06-03 NOTE — Assessment & Plan Note (Signed)
Mr. Givler has had a moderately severe ischemic cardiomyopathy for which an AICD was placed.  LV systolic function will be reevaluated.

## 2010-06-05 ENCOUNTER — Encounter: Payer: Self-pay | Admitting: Cardiology

## 2010-06-07 ENCOUNTER — Encounter: Payer: Self-pay | Admitting: *Deleted

## 2010-06-07 LAB — CBC WITH DIFFERENTIAL/PLATELET
Basophils Absolute: 0 10*3/uL (ref 0.0–0.1)
Basophils Relative: 0 % (ref 0–1)
Eosinophils Relative: 4 % (ref 0–5)
HCT: 45 % (ref 39.0–52.0)
MCHC: 33.3 g/dL (ref 30.0–36.0)
MCV: 93.9 fL (ref 78.0–100.0)
Monocytes Absolute: 0.4 10*3/uL (ref 0.1–1.0)
Neutro Abs: 3.9 10*3/uL (ref 1.7–7.7)
Platelets: 181 10*3/uL (ref 150–400)
RDW: 13.4 % (ref 11.5–15.5)
WBC: 6 10*3/uL (ref 4.0–10.5)

## 2010-06-07 LAB — COMPREHENSIVE METABOLIC PANEL
AST: 20 U/L (ref 0–37)
Alkaline Phosphatase: 56 U/L (ref 39–117)
BUN: 15 mg/dL (ref 6–23)
Glucose, Bld: 112 mg/dL — ABNORMAL HIGH (ref 70–99)
Total Bilirubin: 0.4 mg/dL (ref 0.3–1.2)

## 2010-06-07 LAB — LIPID PANEL
HDL: 34 mg/dL — ABNORMAL LOW (ref >39)
LDL Cholesterol: 47 mg/dL (ref 0–99)
Total CHOL/HDL Ratio: 2.7 Ratio
Triglycerides: 62 mg/dL (ref <150)
VLDL: 12 mg/dL (ref 0–40)

## 2010-06-07 NOTE — Assessment & Plan Note (Signed)
[facility]                         ELECTROPHYSIOLOGY OFFICE NOTE   NAME:Curto, KHALEL ALMS                       MRN:          [id number]  DATE:08/06/2006                            DOB:          03/23/1945    Mr. Jacinto was seen today in the device clinic for followup of his newly  implanted Medtronic Maximo 7232 ICD.  His device was implanted on July 17, 2006 for ischemic cardiomyopathy.  Interrogation of his device  demonstrates R waves of 12.1 mV with an RV impedance of 416 ohms and a  threshold of 1 V at 0.2 ms.  His shock impedances were 44 and 59 ohms.  His battery voltage was 3.20 V with a charge time of 8.58 seconds.  He  had had no episodes since implant.  He was in normal sinus rhythm today;  and needed pacing less than 0.1% of the time.  Mr. Gille has an  appointment in October 2008 to return to see Dr. Ladona Ridgel in the  [facility].      Gypsy Balsam, RN,BSN  Electronically Signed      Doylene Canning. Ladona Ridgel, MD  Electronically Signed   AS/MedQ  DD: 08/06/2006  DT: 08/06/2006  Job #: [id number]

## 2010-06-07 NOTE — Consult Note (Signed)
[name], [name]                ACCOUNT NO.:  [id number]   MEDICAL RECORD NO.:  [id number]          PATIENT TYPE:  INP   LOCATION:  A330                          FACILITY:  [facility]   PHYSICIAN:  Jonelle Sidle, MD DATE OF BIRTH:  May 22, 1945   DATE OF CONSULTATION:  DATE OF DISCHARGE:                                 CONSULTATION   REFERRING PHYSICIAN:  Dr. Skeet Latch with the [facility] Team.   PRIMARY CARDIOLOGIST:  Dr. Guayama Bing.   ELECTROPHYSIOLOGIST:  Dr. Lewayne Bunting.   REASON FOR CONSULTATION:  Syncope.   HISTORY OF PRESENT ILLNESS:  [name] is a 65 year old male with a  known ischemic cardiomyopathy, ejection fraction of 25%, also associated  with moderately severe aortic stenosis based on his last echocardiogram  from April of 2008.  At that time his mean transvalvular gradient was 36  mmHg, and he also had mild to moderate aortic regurgitation.  He is  status post Medtronic defibrillator placement, and is followed by Dr.  Ladona Ridgel with reportedly a recent phone device interrogation earlier in  the month.  He was told that he needed to present this month for further  device evaluation.  [name] denies any obvious defibrillator  discharges.  He has NYHA class II to III dyspnea on exertion, and also  reports a recent cold with chest congestion and mild cough, but no  active wheezing, fevers or chills.  He denies any recent anginal chest  pain.  He reports compliance with medications.   [name] is now admitted to the hospital describing an episode of  apparent syncope.  He states that he went to [name] to pick up a  medication prescription, and after he walked back to his car and sat  down.  Apparently his wife noted him to suddenly clutch his chest, and  then had frank syncope lasting approximately 10-15 seconds.  He does not  recall details of the event.  During observation so far in the hospital  he has had no dysrhythmias on telemetry monitoring.   Electrocardiogram  shows sinus rhythm with a left anterior fascicular block, poor anterior  R-wave progression, and fairly diffuse T-wave inversions in the inferior  and lateral leads.  I do not have an old tracing at hand for comparison.  Cardiac markers are initially reassuring with a troponin I level of 0.06  and a normal CK-MB level of 1.7.  He does not appear to be markedly  volume overloaded or in decompensated heart failure, and has a BNP level  of 135.  His chest x-ray shows no acute infiltrates or pulmonary edema.  We have been asked to evaluate further.   ALLERGIES:  No known drug allergies.   MEDICATIONS:  1. Ventolin metered dose inhaler treatments q.4 hours.  2. Aspirin 81 mg p.o. daily.  3. Coreg 25 mg p.o. b.i.d.  4. Lovenox 40 mg subcu q.24 hours.  5. Atrovent metered-dose inhaler q.6 hours.  6. Lisinopril 10 mg p.o. daily.  7. Protonix 40 mg p.o. daily.  8. Senokot-S 1 p.o. q.h.s.  9. Zocor 40 mg p.o.  q.h.s.   PAST MEDICAL HISTORY:  Is outlined above.  Additional problems include:  1. Hypertension.  2. Hyperlipidemia.  3. Coronary artery disease status post myocardial infarctions in 1993      and in 2001.  The patient has undergone previous percutaneous      angioplasty and stent placement to the right coronary artery in      2000.  There is a previous Myoview study from May of 2008      demonstrating evidence of a large anteroseptal and apical scar with      no frank ischemia and an ejection fraction of 27%.  4. The patient is status post hernia repair.   SOCIAL HISTORY:  [name] has a history of tobacco use, denies any  alcohol use.  He previously drove a truck.   REVIEW OF SYSTEMS:  As outlined above.  No fevers or chills.  He  describes a vague sense of palpitations around Christmas.  He is not at  all certain that he has ever had any device discharges.  He has mild  lower extremity edema, but no orthopnea or PND.  No bleeding diathesis.  He states a  regular appetite.  He does report a recent cold with chest  congestion and a cough; otherwise, negative.   FAMILY HISTORY:  Was reviewed.  It is noncontributory at this point.   PHYSICAL EXAMINATION:  Temperature is 98.9 degrees, heart rate in the  80s to 90s in sinus rhythm, respirations 20, blood pressure 110/62,  oxygen saturation is 98% on room air.  This is an overweight male seated in no acute distress without active  chest pain or breathlessness at rest.  HEENT:  Conjunctivae is normal.  Oropharynx clear.  Poor dentition.  NECK:  Supple.  No elevated jugular venous pressure, possible left  carotid bruit versus referral of cardiac murmur.  No thyromegaly.  LUNGS:  Exhibit diminished breath sounds throughout.  Nonlabored  breathing.  No egophony or wheezing noted.  CARDIAC:  Regular rate and  rhythm with a 3/6 harsh systolic murmur heard best at the base with  diminished 2nd heart sound.  No pericardial rub or S3 gallop, device  pocket site is unremarkable.  ABDOMEN:  Soft, nontender with no active bowel sounds.  EXTREMITIES:  Exhibit 1+ edema, symmetric, distal pulses are 1+.  SKIN:  Warm and dry.  MUSCULOSKELETAL:  No kyphosis noted.  NEUROPSYCHIATRIC:  The patient is alert and oriented x3.  Affect is  appropriate.   LABORATORY DATA:  WBC 7.2, hemoglobin 13.4, hematocrit 40.2, platelets  188.  INR 1.1.  Sodium 132, potassium 4.3, chloride 104, bicarb 27,  glucose 137, BUN 14, creatinine 0.9.  Total CK 106, CK-MB 1.7, troponin  I 0.06.  BNP 135.  Urinalysis essentially normal.   IMPRESSION:  1. Recent episode of syncope.  Not entirely clear based on description      whether the patient's defibrillator fired or not.  He has had some      vague palpitations over the last month, but no frank dizziness or      antecedent history of syncope.  2. Known ischemic cardiomyopathy status post remote myocardial      infarction with evidence of anteroapical scar based on most recent       Myoview in 2006 and prior history of stent placement by Dr. Juanda Chance.      [name] denies any recent angina, although does have [place]      Heart Association  class II to III dyspnea on exertion.  His initial      cardiac markers are fairly reassuring.  He does not appear to be      volume overloaded or have frankly decompensated systolic heart      failure symptoms.  3. Moderately severe aortic stenosis based on most recent      echocardiogram from April of 2008.  Mean transvalvular gradient was      36 mmHg at that time in the setting of moderately decreased left      ventricular systolic function at 25%.  It may well be that this has      progressed over time, although his syncope was not clearly induced      by exertion.  Follow-up echocardiography is pending at this time.   RECOMMENDATIONS:  We will plan to have the patient's Medtronic  defibrillator formally interrogated today.  This may shed some light on  the patient's episode of syncope.  A follow-up 2-D echocardiogram will  also be obtained to reassess left ventricular systolic function and  degree of aortic stenosis.  Would continue to cycle cardiac markers,  and, otherwise, continue present cardiac regimen.  A fasting lipid  profile will be obtained.  We will plan to follow him with you.      Jonelle Sidle, MD  Electronically Signed     SGM/MEDQ  D:  02/05/2008  T:  02/05/2008  Job:  [id number]   cc:   Skeet Latch, DO   Gerrit Friends. Dietrich Pates, MD, [facility]  [address]  [place], [place] [address]   Doylene Canning. Ladona Ridgel, MD  [address]  [address]  [place]  [place] [address]

## 2010-06-07 NOTE — Discharge Summary (Signed)
NAMEINMAN, FETTIG                ACCOUNT NO.:  [id number]   MEDICAL RECORD NO.:  [id number]          PATIENT TYPE:  INP   LOCATION:  2022                         FACILITY:  [facility]   PHYSICIAN:  Maple Mirza, PA   DATE OF BIRTH:  [birth year]   DATE OF ADMISSION:  07/17/2006  DATE OF DISCHARGE:  07/18/2006                               DISCHARGE SUMMARY   ALLERGIES:  This patient has no known drug allergies.   Dictation and the exam:  Greater than 35 minutes.   FINAL DIAGNOSES:  1. Ischemic cardiomyopathy, history of myocardial infarctions, 1993      and 2001.  2. Decreased ejection fraction of 25%.   SECONDARY DIAGNOSES:  1. Discharging day 1 status post implantation of Medtronic MAXIMO base      VR single chamber cardioverter defibrillator, Dr. Lewayne Bunting.  2. Ischemic cardiomyopathy.      a.     Myocardial infarction in 1993, and myocardial infarction in       2001.      b.     Ejection fraction 25%.      c.     [place] Heart Association class II chronic, systolic       congestive heart failure.  3. Hypertension.  4. Dyslipidemia.  5. Long-length tobacco habituation.   PROCEDURE:  July 17, 2006:  Implant of single chamber cardioverter  defibrillator, Dr. Lewayne Bunting.  The patient has no post-procedural  complications, and is discharging post-procedure day #1.   BRIEF HISTORY:  Mr. Vogelsang is a 65 year old male.  He has a history of  ischemic cardiomyopathy.  His initial myocardial infarction was 63.  He had a recurrent myocardial infarction in 2001.  At both of these  events, he had intervention and/or stenting.   The patient now has an EF of 25%.  He also has moderate aortic stenosis.  The patient denies chest pain, shortness of breath, or lightheadedness.  He did have an episode of syncope back in 2001, but nothing since.  He  recently underwent exercise treadmill study, where he walked 8 minutes  on a Bruce protocol.   The patient qualifies for  cardioverter defibrillator for prophylactic  prevention of sudden cardiac death.  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  He is discussing his options  and will let us know.   HOSPITAL COURSE:  The patient presented electively July 17, 2006.  He  underwent implantation of a Medtronic MAXIMO single chamber cardioverter  defibrillator.  The x-ray shows the lead in the appropriate position.  He had interrogation of the device post-procedure day #1, and all  perimeters are within normal limits.  Mobility of the left arm and  incision care has been discussed with the patient.  He has followup  appointments at [facility] [address]  1. ICD clinic, Monday, July 14 at 9:00.  2.  He will see Dr. Ladona Ridgel, Tuesday, October 7 at 9:30.   He was asked to keep his incision dry for the next 7 days, to sponge  bathe until Tuesday, July 1.   MEDICATIONS:  1. Lisinopril 10 mg daily.  2. Lipitor 20 mg daily at bedtime.  3. Metoprolol 50 mg tablets, 1 in the morning and 1 in the evening.  4. Enteric coated aspirin 81 mg daily.   LABORATORY STUDIES:  Taken June 19, pertinent of this admission:  Sodium  142, potassium is 5, chloride 110, carbonate 33, glucose 97, BUN is 13,  creatinine 0.9.  The pro time is 11.3, INR is 0.9, PTT is 28.  White  cells are 8.8, hemoglobin 15.3, hematocrit 45.6, and platelets are 180.      Maple Mirza, PA     GM/MEDQ  D:  07/18/2006  T:  07/18/2006  Job:  [id number]   cc:   Gerrit Friends. Dietrich Pates, MD, [facility]  Doylene Canning. Ladona Ridgel, MD  Corrie Mckusick, M.D.

## 2010-06-07 NOTE — Assessment & Plan Note (Signed)
[facility]                       [place] CARDIOLOGY OFFICE NOTE   NAME:[name], [name]                       MRN:          [id number]  DATE:02/17/2008                            DOB:          06-Dec-1945    CARDIOLOGIST:  Gerrit Friends. Dietrich Pates, MD, [facility]   PRIMARY CARE PHYSICIAN:  Dr. Phillips Odor.   ELECTROPHYSIOLOGIST:  Doylene Canning. Ladona Ridgel, MD   REASON FOR VISIT:  Posthospitalization followup.   HISTORY OF PRESENT ILLNESS:  [name] is a 65 year old male patient  with history of ischemic cardiomyopathy with an EF of 25-30%, moderate-  to-severe aortic stenosis, status post AICD implantation, who recently  presented to [facility] with syncope.  He ruled out for  myocardial infarction by enzymes.  His defibrillator was interrogated  and this demonstrated T-wave oversensing and inappropriate shock  therapy.  He received anti-tachycardia pacing for his oversensing of the  T-waves that resulted in an accelerated rhythm that progressed to V-fib.  Changes were made by the Medtronic tech.  He had an echocardiogram that  demonstrated an EF of 25-30% with akinesis with scarring of the entire  anteroseptal wall and akinesis of periapical wall and mild diastolic  dysfunction.  His aortic stenosis was rated as severe.  Peak transaortic  velocity was 448 cm/sec and his mean gradient was 47 mmHg.  Since  discharge from the hospital, he has not had any further syncope.  He did  see the EP nurse last week and was told that everything was working  appropriately.  He notes today that he is overall doing well.  He does  report progressively worsening dyspnea with exertion over the last 6  months.  He describes probable NYHA class IIb symptoms.  He denies  orthopnea, PND, or pedal edema.  He denies any chest discomfort.  The  other night he did have some back discomfort while doing dishes.  However, this was not very strenuous for him and he has done other  strenuous activities without any back or chest discomfort.  He denies  any arm or jaw discomfort.   CURRENT MEDICATIONS:  1. Lisinopril 10 mg daily.  2. Aspirin 81 mg daily.  3. Simvastatin 40 mg daily.  4. Carvedilol 25 mg b.i.d.  5. Nitroglycerin p.r.n.   PHYSICAL EXAMINATION:  GENERAL:  He is a well-nourished and well-  developed male in no acute distress.  VITAL SIGNS:  Blood pressure 100/72 with a pulse of 68, weight 195  pounds.  Repeat blood pressure by me on the right is 112/80 and on the  left 100/80.  HEENT:  Normal.  NECK:  Without JVD.  CARDIAC:  Normal S1.  Absent S2.  Regular rate and rhythm.  A 2-3/6  systolic murmur heard best at right sternal border.  LUNGS:  Clear to auscultation bilaterally.  No wheezing, rhonchi, or  rales.  ABDOMEN:  Soft and nontender.  EXTREMITIES:  Without edema.  NEUROLOGIC:  He is alert and oriented x3.  Cranial nerves II through XII  grossly intact.   Electrocardiogram demonstrates sinus rhythm with a heart rate of 61,  left axis  deviation, right bundle-branch block, poor R-wave progression,  T-wave inversions in I, II, III, aVF, and V4 through V6.  No significant  changes when compared to prior tracing dated Jun 08, 2006.   ASSESSMENT AND PLAN:  1. Recent episode of syncope in the setting of anti-tachycardia pacing      leading to ventricular fibrillation and AICD shock therapy.  He was      oversensing his T-waves and adjustments were made in his      parameters.  He has had no further syncope since that time.  2. Severe aortic stenosis as outlined above.  His aortic stenosis      seems to have progressed somewhat since he was last assessed.  He      is describing significant dyspnea with exertion.  He is having no      chest discomfort and it does not appear that his syncope was      related to his aortic stenosis.  I discussed his case further with      Dr. Dietrich Pates.  We will plan to proceed with gated exercise      treadmill  test only in the office within the next week.  The      patient will follow up with Dr. Dietrich Pates the same day to discuss      the findings.  Further recommendations will be made based upon his      functional status at that time.  The patient's blood pressure is      somewhat low.  I have asked him to decrease his lisinopril from 10      to 5 mg a day.  3. Coronary artery disease, status post prior anterior wall myocardial      infarction in 1993 treated with percutaneous transluminal coronary      angioplasty and non-ST elevation myocardial infarction in November      2000 treated with stenting to the right coronary artery x2.  His      last catheterization demonstrated a totally occluded left anterior      descending.  He ruled out for myocardial function by enzymes when      he was recently admitted.  He is having no anginal symptoms at this      time.  No further workup is planned at this time.  4. Ischemic cardiomyopathy with an ejection fraction of 25-30%.  He is      optivolemic on exam today.  No further medication changes will be      made today.  5. Dyslipidemia.  During his recent hospitalization, his lipids      demonstrated a total cholesterol of 92, triglycerides 50, HDL 23,      and LDL 57.  He will continue on simvastatin.  I have asked him to      start on fish oil 1000 mg a day.  We will recheck lipids and LFTs      in 3 months.   DISPOSITION:  Follow up with Dr. Dietrich Pates within the next week on the  day of his exercise treadmill test.      Tereso Newcomer, PA-C  Electronically Signed      Gerrit Friends. Dietrich Pates, MD, [facility]  Electronically Signed   SW/MedQ  DD: 02/17/2008  DT: 02/18/2008  Job #: [id number]   cc:   Dr. Phillips Odor

## 2010-06-07 NOTE — H&P (Signed)
[name], [name]                ACCOUNT NO.:  [id number]   MEDICAL RECORD NO.:  [id number]          PATIENT TYPE:  AMB   LOCATION:  DAY                           FACILITY:  [facility]   PHYSICIAN:  Dalia Heading, M.D.  DATE OF BIRTH:  December 13, 1945   DATE OF ADMISSION:  DATE OF DISCHARGE:  LH                              HISTORY & PHYSICAL   CHIEF COMPLAINT:  Right inguinal hernia.   HISTORY OF PRESENT ILLNESS:  Patient is a 65 year old white male who was  referred for evaluation and treatment of a right inguinal hernia.  It  has been present for several months, made worse with straining.  No  nausea or vomiting have been noted.   PAST MEDICAL HISTORY:  Hypertension, coronary artery disease.   PAST SURGICAL HISTORY:  Angioplasty, stent placement.   MEDICATIONS:  Lisinopril, metoprolol, Lipitor, baby aspirin.   ALLERGIES:  No known drug allergies.   REVIEW OF SYSTEMS:  Patient denies drinking or smoking.  Denies any  recent chest pain, MI, CVA, or diabetes mellitus.   PHYSICAL EXAMINATION:  GENERAL:  Patient is a well-developed and well-  nourished white male in no acute distress.  LUNGS:  Clear to auscultation with equal breath sounds bilaterally.  HEART:  Regular rate and rhythm without S3, S4, or murmurs.  ABDOMEN:  Soft, nontender, nondistended.  No hepatosplenomegaly or  masses are noted.  A reducible right inguinal hernia is noted.  GENITOURINARY:  Within normal limits.   IMPRESSION:  Right inguinal hernia.   PLAN:  Patient is scheduled for a right inguinal herniorrhaphy on Jun 13, 2006.  The risks and benefits of the procedure, including bleeding,  infection, pain, and recurrence of the hernia were fully explained to  the patient, who gave informed consent.      Dalia Heading, M.D.  Electronically Signed     MAJ/MEDQ  D:  05/29/2006  T:  05/29/2006  Job:  [id number]   cc:   [facility]  Fax: [phone]   Kirk Ruths, M.D.  Fax: [phone]

## 2010-06-07 NOTE — Assessment & Plan Note (Signed)
[facility]                         ELECTROPHYSIOLOGY OFFICE NOTE   NAME:[name], [name]                       MRN:          [id number]  DATE:11/14/2006                            DOB:          11/19/1945    The patient returns today for follow-up.  He is a very pleasant middle-  aged man with an ischemic cardiomyopathy, congestive heart failure, VT  status post ICD insertion.  He returns today for follow-up.  His heart  failure is class II.  He denies chest pain, he denies shortness of  breath.  He does get some fatigue when he walks.  He denies peripheral  edema.   PHYSICAL EXAMINATION:  GENERAL:  He is a pleasant, well-appearing man in  no distress.  VITAL SIGNS:  Blood pressure 140/90, pulse 65 and regular, respirations  18, and weight 200 pounds.  NECK:  No jugular venous distention.  LUNGS:  Clear to auscultation bilaterally.  HEART:  Regular rate and rhythm and grade 2/6 systolic murmur at the  right upper sternal border.  The aortic component of the second heart  sound was decreased fairly markedly.  EXTREMITIES:  No edema.   Interrogation of his defibrillator demonstrates a Medtronic Maximow with  R waves of 16, impedance of 488, and threshold of 1 volt at 0.2.  Battery voltage was 3.2 volts.  We decreased his output down to 2 at 0.4  today to maximize battery longevity.   IMPRESSION:  1. Ischemic cardiomyopathy.  2. Congestive heart failure.  3. Aortic stenosis.  4. Status post implantable cardioverter defibrillator insertion.   DISCUSSION:  Overall the patient is stable.  His defibrillator is  working normally.  His aortic stenosis is not trivial by any means,  though he is not severe enough symptomatically to recommend any surgery  at the present time.  We will plan to see him back for ICD follow-up in  four months.     Doylene Canning. Ladona Ridgel, MD  Electronically Signed    GWT/MedQ  DD: 11/14/2006  DT: 11/15/2006  Job #: [id number]   cc:   Corrie Mckusick, M.D.

## 2010-06-07 NOTE — Group Therapy Note (Signed)
[name], [name]                ACCOUNT NO.:  [id number]   MEDICAL RECORD NO.:  [id number]          PATIENT TYPE:  INP   LOCATION:  A330                          FACILITY:  [facility]   PHYSICIAN:  Margaretmary Dys, M.D.DATE OF BIRTH:  Apr 08, 1945   DATE OF PROCEDURE:  02/05/2008  DATE OF DISCHARGE:                                 PROGRESS NOTE   SUBJECTIVE:  The patient was admitted with syncope yesterday.  The  patient has been seen by cardiology and we are awaiting interrogation of  his defibrillator.   The patient has a Medtronic defibrillator.  The patient has a history of  moderately severe aortic stenosis with a mean gradient of about 36 mm  from his echo back in April of 2008.  The patient already has an echo  pending today.  The patient also appeared to have what appeared to be  some viral respiratory tract infection.  He had cough productive of  mostly yellowish sputum.  He had no chest pressure or pain.  He had no  fevers or chills.   OBJECTIVE:  GENERAL:  The patient was conscious, alert, comfortable, not  in acute distress.  Well-oriented in time, place and person.  VITAL SIGNS:  Blood pressure is 110/62 with a pulse of 92, respirations  20, temperature 98.9 degrees Fahrenheit, oxygen saturation was 93% on  room air.  HEENT:  Normocephalic, atraumatic.  Oral mucosa was dry.  No exudates  were noted.  NECK:  Supple.  No JVD or lymphadenopathy.  LUNGS:  Lungs were clear clinically with good air entry bilaterally.  HEART:  S1-S2; no S3, S4, gallops or rubs.  ABDOMEN:  Abdomen was soft, nontender.  Bowel sounds were positive.  No  masses palpable.  EXTREMITIES:  Trace pitting pedal edema with no calf induration or  tenderness noted.  CNS:  Exam was grossly intact with no focal neurological deficits.   LABORATORY/DIAGNOSTIC DATA:  White blood cell count 7.2, hemoglobin of  13.4, hematocrit 40.2, platelet count was 188 with no left shift.  PT  14.9, INR was 1.1.  Sodium  137, potassium 4.3, chloride of 104, CO2 was  27, glucose 137, BUN of 14, creatinine was 1.  Cardiac enzymes were  negative.  BNP was 135.  His total cholesterol was 92, HDL was 23, LDL  was 57.  Telemetry monitor has not indicated any arrhythmias since  admission.   ASSESSMENT:  1. Syncope possibly related to some cardiac event.  2. History of ischemic cardiomyopathy with ejection fraction of about      20% from an echo back in 2006.  3. History of severe coronary artery disease status post multiple      stent placements in the past.  4. History of moderate to severe aortic stenosis in April of 2008 with      a mean transvalvular gradient of 36 mmHg.  5. Recent respiratory tract infection for which the patient was      prescribed some Avelox but he has yet to receive the Avelox.  6. History of IM antibiotic shot in the primary care  physician's      office, the exact nature of which is not known at this time.   PLAN:  1. We are waiting interrogation of the patient's defibrillator.  2. I will restart his Avelox for his respiratory tract infection as      the patient may have an acute bacterial bronchitis now.  3. Will continue all his home medications.   We appreciate cardiology input and will continue to follow their  recommendations.  Otherwise, the patient remains stable.  Once we are  able to interrogate his AICD and evaluate what happened the patient may  be able to be discharged home.      Margaretmary Dys, M.D.  Electronically Signed     AM/MEDQ  D:  02/05/2008  T:  02/05/2008  Job:  [id number]

## 2010-06-07 NOTE — Progress Notes (Signed)
Result letter mailed to pt.

## 2010-06-07 NOTE — Op Note (Signed)
NAMESALAAM, [name]                ACCOUNT NO.:  [id number]   MEDICAL RECORD NO.:  [id number]          PATIENT TYPE:  INP   LOCATION:  2022                         FACILITY:  [facility]   PHYSICIAN:  Doylene Canning. Ladona Ridgel, MD    DATE OF BIRTH:  Apr 17, 1945   DATE OF PROCEDURE:  07/17/2006  DATE OF DISCHARGE:  07/18/2006                               OPERATIVE REPORT   PROCEDURE PERFORMED:  Implantation of a single-chamber ICD.   INDICATIONS:  Ischemic cardiomyopathy, status post MI, with severe LV  dysfunction, EF 30%, class II heart failure.   I. INTRODUCTION:  The patient is a 65 year old male with a history of  prior myocardial infarction, class II heart failure, EF of 25%-30%, who  is referred now for ICD implantation.   II. PROCEDURE:  After informed consent was obtained, the patient was  taken to the diagnostic EP lab in the fasting state.  After the usual  preparation and draping, intravenous fentanyl and midazolam were given  for sedation.  30 cc of lidocaine was infiltrated in the left  infraclavicular region.  A 6 cm incision was carried over this region.  Electrocautery was utilized to dissect down the fascial plane.  10 cc of  contrast was injected into the left upper extremity venous system,  demonstrating a patent left subclavian vein.  This was successfully  punctured, and the Medtronic model [phone] Sprint Quattro Secure active  fixation defibrillation lead, serial number [id number], was advanced  into the right ventricle.  Mapping was carried out at the final site in  the RV.  The R-waves were 17, and the pacing impedance 914 ohms, and  threshold 0.8 V at 0.5 msec.  10 volts pacing did not stimulate the  diaphragm, and there was a nice injury current with the lead actively  fixed.  With these satisfactory parameters, the lead was secured to the  subpectoralis fascia with a figure-of-eight silk suture.  The sewing  sleeve was also secured with a silk suture.  Electrocautery was  utilized  to make a subcutaneous pocket.  Kanamycin irrigation was utilized to  irrigate the pocket.  Electrocautery was utilized to assure hemostasis.  At this point, the Medtronic Maximo, model 7232 single-chamber  defibrillator, serial number [id number], was connected to the  defibrillation lead and placed in the subcutaneous pocket.  The  generator was secured with a silk suture.  Defibrillation threshold  testing was carried out.   After the patient was more deeply sedated with fentanyl and Versed, VF  was induced with T-wave shock, and a 15-joule shock was subsequently  delivered which terminated VF and restored sinus rhythm.  At this point,  no additional defibrillation threshold test was carried out, and the  incision was closed with a layer of 2-0 Vicryl followed by a layer of 3-  0 Vicryl, followed by a layer of 4-0 Vicryl.  Benzoin was painted on the  skin, Steri-Strips were applied, and a pressure dressing placed.  The  patient returned to his room in satisfactory condition.   III. COMPLICATIONS:  No immediate procedural complications.   IV.  RESULTS:  This demonstrate successful implantation of a Medtronic  single-chamber defibrillator in a patient with ischemic cardiomyopathy  and remote syncope, EF 30%.      Doylene Canning. Ladona Ridgel, MD  Electronically Signed     GWT/MEDQ  D:  07/17/2006  T:  07/18/2006  Job:  [id number]   cc:   Gerrit Friends. Dietrich Pates, MD, [facility]  Corrie Mckusick, M.D.

## 2010-06-07 NOTE — Procedures (Signed)
[facility]                              EXERCISE TREADMILL   NAME:Apple, NILS THOR                       MRN:          [id number]  DATE:02/26/2008                            DOB:          05/18/1945    REFERRING DOCTOR:  Dr. Phillips Odor.   CLINICAL DATA:  A 65 year old gentleman with ischemic cardiomyopathy and  severe aortic stenosis.  The study performed predominantly to determine  the patient's exercise capacity.  1. Treadmill exercise performed on a modified Bruce protocol to a      workload of 7 METs and a heart rate of 129, 82% of age predicted      maximum.  Exercise discontinued due to generalized fatigue and mild      dyspnea; no chest discomfort reported.  2. Blood pressure increased from a resting value of 140/80 to 160/70      at peak exercise, a somewhat flat but basically normal response.  3. No arrhythmias noted.  4. Baseline EKG:  Sinus bradycardia at a rate of 58; left axis      deviation; prominent inferolateral T-wave inversion; prior      anteroseptal myocardial infarction.  5. Stress EKG:  Occasional PVCs during exercise; T-wave inversions      moderated and even reversed in some leads; no significant change in      ST segments with exertion.   IMPRESSION:  Negative stress EKG revealing adequate exercise tolerance,  fairly moderate symptoms during the exertion and, a somewhat flat blood  pressure response and no electrocardiographic evidence for myocardial  ischemia.  Other findings as noted.     Gerrit Friends. Dietrich Pates, MD, [facility]  Electronically Signed    RMR/MedQ  DD: 02/27/2008  DT: 02/27/2008  Job #: [id number]

## 2010-06-07 NOTE — Assessment & Plan Note (Signed)
OFFICE VISIT   [name], [name] A  DOB:  11/24/1945                                        December 03, 2008  CHART #:  [id number]   The patient returns to the office today for followup check following his  treatment for critical aortic stenosis and depressed LV function.  On  October 02, 2008, he had a coronary artery bypass grafting and aortic  valve replacement with a 23 pericardial tissue valve in spite of  preoperative valve area of 0.6, velocity of 4.5 meters, and depressed LV  function and ejection fraction in 20-25% range.  He has done well  postoperatively.  He has now started in cardiac rehab and has been there  for several visits.  He denies any overt symptoms of congestive heart  failure.  He is increasing his physical activity appropriately without  significant shortness of breath or evidence of congestive heart failure.   On exam, his blood pressure 143/90, this he notes had been low and is  slowly increasing; his heart rate is 74; respiratory rate is 18; and O2  sats 97%.  Overall, the patient looks in very good condition.  His  breath sounds are distant, but without active wheezing.  Cardiac exam  reveals a regular rate and rhythm.  The sternum is stable and well  healed.  He has no pedal edema.  His endovein harvest sites are healing  well.   Followup chest x-ray shows resolution of the small right pleural  effusion that he had, otherwise clear lung fields.   Overall, I am very pleased with his progress over time.  He may have to  increase his Coreg and restart his lisinopril.  These dosages had been  adjusted early after surgery when his blood pressure was lower.  However, I will leave this up to the discretion of Cardiology.  He is  currently having his blood pressure checked frequently when in cardiac  rehab.  I have not made him a return appointment to see me, but would be  glad to see him at his or Dr. Marvel Plan request at anytime.   I have  again reviewed with him the need for a dental prophylaxis because of his  prosthetic valve should he need any and the importance of good dental  care.  He understands this.   Sheliah Plane, MD  Electronically Signed   EG/MEDQ  D:  12/03/2008  T:  12/04/2008  Job:  [id number]   cc:   Gerrit Friends. Dietrich Pates, MD, [facility]

## 2010-06-07 NOTE — H&P (Signed)
NAMETYQUAVIOUS, GAMEL                ACCOUNT NO.:  [id number]   MEDICAL RECORD NO.:  [id number]          PATIENT TYPE:  INP   LOCATION:  A330                          FACILITY:  [facility]   PHYSICIAN:  Skeet Latch, DO    DATE OF BIRTH:  1945-08-24   DATE OF ADMISSION:  02/04/2008  DATE OF DISCHARGE:  LH                              HISTORY & PHYSICAL   PRIMARY CARE PHYSICIAN:  Dr. Phillips Odor.   CHIEF COMPLAINT:  Syncope.   HISTORY OF PRESENT ILLNESS:  This is a 65 year old Caucasian male who  presents after a syncopal episode.  Apparently the patient was with his  spouse at [facility] getting a prescription filled and had syncopal episode.  The patient's only complaint over the last week he has had upper  respiratory-type symptoms with cough and congestion.  The patient denied  any chest pain, shortness of breath or headache.  Apparently, the  patient, after picking up the prescription he got into the car with  wife, he grabbed his left upper chest and passed out for approximately  10-20 seconds.  There was no confusion, loss of bowel or bladder  function.  Upon my exam the patient states that he feels fine.  Denies  any chest pain, lightheadedness, dizziness, abdominal pain or any  symptoms of any kind.   PAST MEDICAL HISTORY:  Includes a history of:  1. Arrhythmia.  2. Myocardial infarction.  3. Hypertension.  4. Hypercholesterolemia.  5. History of cardiac defibrillator.  6. Ischemic cardiomyopathy.   SURGICAL HISTORY:  1. Positive for hernia repair  2. Stent placement x2.   SOCIAL HISTORY:  Nonsmoker, nondrinker.  No history of drug abuse.   DRUG ALLERGIES:  None.   HOME MEDICATIONS:  Include:  1. Lisinopril 10 mg daily.  2. Coreg 25 mg twice a day.  3. Simvastatin 40 mg daily.  4. Aspirin 81 mg daily.   REVIEW OF SYSTEMS:  CONSTITUTIONAL:  No fever, chills, weight gain,  weight loss.  HEENT: Unremarkable.  CARDIOVASCULAR:  Unremarkable.  RESPIRATORY:  Positive for cough.   GASTROINTESTINAL: Unremarkable.  GENITOURINARY:  Unremarkable.  MUSCULOSKELETAL:  Unremarkable.  SKIN,  NEURO, PSYCHIATRIC, METABOLIC, HEMATOLOGIC:  All unremarkable.   PHYSICAL EXAMINATION:  VITAL SIGNS:  Temperature is 99.0, pulse 83,  respirations 18, blood pressure 103/67, satting 98% on room air.  CONSTITUTIONAL:  Well-developed, well-nourished, well-hydrated in no  acute distress.  HEENT:  Head atraumatic, normocephalic.  Eyes:  PERRL.  EOMI.  NECK:  Soft, supple, nontender, nondistended.  Oral mucosa is moist.  CARDIOVASCULAR:  He has regular rate and rhythm with 3+ systolic  ejection murmur appreciated.  RESPIRATORY:  Decreased breath sounds  clear.  No rales, rhonchi or wheezing.  ABDOMEN:  Obese, soft, nontender, nondistended.  Positive bowel sounds.  No rigidity or guarding.  EXTREMITIES:  No clubbing, cyanosis or edema.  NEUROLOGICAL:  Cranial nerves II-XII are grossly intact.  The patient  moves all extremities.  Alert and oriented x3.  SKIN:  Normal, warm, dry.  No petechial hemorrhages are noted.   LABS:  CK-MB 1.6, troponin less than 0.05,  myoglobin 97.8.  Sodium 132,  potassium 4.4, chloride 99, CO2 is 28, glucose 127, BUN 12, creatinine  1.01, white count is 9.0.  Hemoglobin 13.4, hematocrit 40.1, platelet  count 172,000.  Urinalysis is unremarkable.  Chest x-ray showed COPD,  showed AICD and no acute abnormalities.   ASSESSMENT:  This is a 65 year old Caucasian male who presents after  apparent syncopal episodes that last approximately 10-20 seconds.  The  patient only admits to having upper respiratory-type symptoms over the  past few days and denies any chest pain, lightheadedness, dizziness or  abdominal pain or any associated symptoms.  The patient does have a  history of coronary artery disease with myocardial infarction with stent  placement as well as a history of ischemic cardiomyopathy.   PLAN:  1. The patient admitted to telemetry unit service of  [facility].  2. Syncope.  The patient will have carotid Doppler studies as well as      a 2-D echocardiogram if one has not been done in the last three      months.  We will get a [facility] cardiology consult at this time      also.  The patient may need his defibrillator interrogated.  The      patient states that he did have it interrogated approximately a      week ago.  Nurse called the next day stating that it needed to be      reprogrammed.  3. Hyponatremia.  The patient will be placed on IV hydrate.  We will      replace with IV hydration with normal saline.  Will recheck his      sodium in the morning.  4. History of hypertension, dyslipidemia.  The patient will placed on      his home medications.  I will get a lipid panel also at this time.      We will get a repeat chest x-ray also in the morning.  5. The patient will be placed on a DVT as well as GI prophylaxis.  6. Pending cardiology workup, anticipate the patient being discharged      in next 1-2 days if he continues to improve and he is symptom free.      Skeet Latch, DO  Electronically Signed     SM/MEDQ  D:  02/05/2008  T:  02/05/2008  Job:  [id number]   cc:   Phillips Odor, DR.
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
## 2010-06-07 NOTE — Assessment & Plan Note (Signed)
[facility]                         ELECTROPHYSIOLOGY OFFICE NOTE   NAME:Mcloud, FEDOR KAZMIERSKI                       MRN:          [id number]  DATE:07/31/2007                            DOB:          [birth year]    Mr. Pennypacker returns today for followup.  His is a very pleasant 65-year-  old male with a history of an ischemic cardiomyopathy, congestive heart  failure, status post ICD insertion.  He also has moderate aortic  stenosis.  He returns today for followup.  All in all, he is stable.  His heart failure varies between class II and class III, mostly class  II.  He denies chest pain.  He denies peripheral edema.   MEDICATION:  1. Lisinopril 10 a day.  2. Aspirin 81 a day.  3. Metoprolol 50 twice a day.  4. Simvastatin 40 a day.   PHYSICAL EXAMINATION:  GENERAL:  He is a very pleasant middle-aged man  in no distress  VITALS:  Blood pressure was 120/68, the pulse 64 and regular, the  respirations were 18, and the weight was 203 pounds.  NECK:  No jugular venous distention.  LUNGS:  Clear bilaterally to auscultation.  No wheezes, rales, or  rhonchi are present.  CARDIOVASCULAR:  Regular rate and rhythm with normal S1 and S2.  There  was a grade 2/6 systolic murmur at the right upper sternal border.  EXTREMITIES:  No cyanosis, clubbing or edema.   Interrogation of his defibrillator demonstrates a Medtronic Maximo.  The  R-wave was 15, the impedance 464 with a threshold 0.5 at 0.4.  Battery  voltage was 3.16 volts.  Underlying rhythm was sinus.  There were no  intercurrent ICD therapies.   IMPRESSION:  1. Ischemic cardiomyopathy.  2. Congestive heart failure.  3. Aortic stenosis.   DISCUSSION:  Overall, Mr. Kamel is stable.  He unfortunately continues  to smoke cigarettes and I have counseled him today on stopping smoking.  In addition, he is on metoprolol and I have recommended that we switch  him from metoprolol to carvedilol, initially 12.5 mg  twice daily with up  titration to 25 mg twice daily, and I have given him instructions on how  to do this over  the next month or 2.  I plan to see the patient back in our office in  several months.  He will follow up in our [facility] program.      Doylene Canning. Ladona Ridgel, MD  Electronically Signed     Doylene Canning. Ladona Ridgel, MD  Electronically Signed   GWT/MedQ  DD: 07/31/2007  DT: 08/01/2007  Job #: [id number]   cc:   Corrie Mckusick, M.D.

## 2010-06-07 NOTE — Letter (Signed)
Jun 08, 2006    Joelyn Oms, MD.  [address]  [place], [place] [address]   RE:  [name], [name]  MRN:  [id number]  /  DOB:  1945/11/07   Dear Vonna Kotyk:   [name] returns to the office to discuss the results of his recent  echocardiogram.  Unfortunately, that study showed continuing  deterioration in the left ventricular systolic function.  Estimated  ejection fraction is now 0.25.  He also has had progressive of aortic  stenosis.  The peak gradient across the valve is now 50-55 mmHg.  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  He definitely has had no chest pain, lightheadedness, nor  syncope.  He does get dyspnea if he tries to do too much.   He and Dr. Lovell Sheehan had planned a repair of an inguinal hernia.  Mr.  [name] occasionally notes an increase in the size of his hernia,  presumably due to prolapse of bowel into it.  He has had no signs nor  symptoms to suggest obstruction.   PHYSICAL EXAMINATION:  GENERAL:  A pleasant, trim gentleman in no acute  distress.  VITAL SIGNS:  The weight is 185 - stable.  Blood pressure 115/70, heart  rate 70 and regular, respirations 16.  NECK:  No jugulovenous distention.  LUNGS:  Clear.  CARDIAC:  Grade 3/6 systolic ejection murmur at the cardiac base; aortic  component in the second heart sound is decreased but present.  There is  very prominent radiation to the carotids with a mild decrease in carotid  upstroke.  ABDOMEN:  Soft and nontender; no organomegaly.  EXTREMITIES:  No edema.   IMPRESSION:  [name] has moderately severe aortic stenosis,  apparently without symptoms, severe impairment in the left ventricular  systolic function with a preserved exercise capacity on his stress test  nearly two years ago, and ischemic heart disease without symptoms to  suggest recurrent critical stenosis.  In order to  quantify his current  exercise tolerance, a standard treadmill stress test will be performed.  If he does reasonably well on this, his risk for herniorrhaphy is  relatively low and acceptable.  He will be referred to Dr. Ladona Ridgel for  consideration of defibrillator implant.  The patient is interested in  the possible use of CHANTIX for discontinuing cigarette smoking.  We  will certainly provide him with this.    Sincerely,      [name] Friends. Dietrich Pates, MD, [facility]  Electronically Signed    RMR/MedQ  DD: 06/08/2006  DT: 06/08/2006  Job #: [id number]   CC:    Dalia Heading, M.D.

## 2010-06-07 NOTE — Cardiovascular Report (Signed)
NAMEDEACON, GADBOIS                ACCOUNT NO.:  [id number]   MEDICAL RECORD NO.:  [id number]          PATIENT TYPE:  OIB   LOCATION:  2899                         FACILITY:  [facility]   PHYSICIAN:  Everardo Beals. Juanda Chance, MD, FACCDATE OF BIRTH:  11/27/1945   DATE OF PROCEDURE:  09/16/2008  DATE OF DISCHARGE:  09/16/2008                            CARDIAC CATHETERIZATION   CLINICAL HISTORY:  Mr. Fangman is 65 years old had a previous anterior  wall infarction treated with PTCA of the LAD in the 90s and had  subsequent occlusion of the LAD.  He has had stents placed in the  proximal and mid right coronary artery about 10 years ago.  He also has  an ICD for syncope and LV dysfunction.  He also has developed  progressive aortic stenosis and has been symptomatic with shortness of  breath and chest tightness with rather minimal exertion.  Dr. Dietrich Pates  scheduled him for evaluation of the right and left heart catheterization  and angiography.   PROCEDURE:  The procedure was performed with the right femoral artery  and arterial sheath and 5-French preformed coronary catheters.  A front  wall arterial puncture was performed and Omnipaque contrast was used.  We crossed the aortic valve using the right coronary catheters and a  straight exchange wire.  The aortic root injection and distal root  injection were performed to assess aortic insufficiency and peripheral  vascular disease.  He tolerated the procedure well and left the  laboratory in satisfactory condition.   RESULTS:  Hemodynamic Data:  The right arterial pressure was 5 mean.  The pulmonary artery pressure was 35/8 with a mean of 20.  Pulmonary  wedge pressure was 9.  Left ventricular pressure is 153/15.  The aortic  pressure was 92/56.  The aortic valve gradient was 61 peak and 46 mean.  The cardiac output/cardiac index was 4.0/1.9 liter/minute/meter square  by thermodilution and 4.2/2.0 liter/minute/meter square by Fick.  The  calculated  aortic valve area was 0.7 sq cm.   Angiographic Data:  Left main coronary artery:  The left main coronary artery was free of  disease.   Left anterior descending artery:  The left anterior ascending artery was  completely occluded after two diagonal branches and a large septal  perforator.  The midportion of the LAD filled by collaterals.   Circumflex artery:  The circumflex artery was a moderate-sized vessel  that gave rise to a large marginal branch and an AV branch was  terminated in a small posterolateral branch.  These vessels were  irregular, but free of significant obstruction.   Right coronary artery:  The right coronary artery was a large dominant  vessel that gave rise to a conus branch, two right ventricle branches, a  posterior descending branch, and posterolateral branch.  There was stent  in the proximal and a stent in the mid-right coronary, which had less  than 10% stenosis.  There was 60% stenosis between the two stents.   Left ventriculogram:  The left ventriculogram performed in RAO  projection showed a large area of apical and anterolateral  wall  akinesis.  The anterior wall near the base move well and the inferior  wall in its midportion and near the base move well.  The estimated  ejection fraction was 25%.   A subclavian injection showed patent subclavian artery and patent  internal mammary and vertebral arteries.   An aortic root injection:  The aortic root injection showed calcified  partly mobile aortic valve and there was moderate aortic regurgitation.   Distal aortogram:  A distal aortogram was performed, which showed patent  renal arteries.  There were irregularities in the aorta and iliac  vessels, but no major obstruction.   CONCLUSION:  1. Coronary artery disease, status post prior PTCA to LAD for an      anterior infarction with septum occlusion and prior stenting of the      proximal mid right coronary artery with total occlusion of the LAD,       no major obstruction of circumflex artery, 0% stenosis at the stent      sites in the proximal and mid right coronary artery with 60%      stenosis between the two stents.  2. Severe aortic stenosis with a peak aortic valve gradient of 61      mmHg, mean aortic valve gradient of 46 mmHg, and a calculated      aortic valve area of 0.7 sq cm.  3. Severe left ventricular dysfunction with apical and anterolateral      wall akinesis and an estimated fraction of 25%.  4. Normal pulmonary artery pressures.   RECOMMENDATIONS:  The patient has severe aortic stenosis and is very  symptomatic.  I think he will require aortic valve replacement and  possible bypass of the right coronary artery.  I will discuss this with  Dr. Dietrich Pates and we will consider surgical consultation.      Bruce Elvera Lennox Juanda Chance, MD, [facility]  Electronically Signed     BRB/MEDQ  D:  09/16/2008  T:  09/17/2008  Job:  [id number]   cc:   Gerrit Friends. Dietrich Pates, MD, [facility]  Corrie Mckusick, M.D.  Cardiopulmonary Lab

## 2010-06-07 NOTE — Assessment & Plan Note (Signed)
[name]                         ELECTROPHYSIOLOGY OFFICE NOTE   NAME:[name]                       MRN:          [id number]  DATE:07/05/2006                            DOB:          [birth year]    REFERRING PHYSICIAN:  Gerrit Friends. Dietrich Pates, MD, [facility]   [name] was referred today by Dr. Courtland Bing for consideration  for ICD implantation.   HISTORY OF PRESENT ILLNESS:  The patient is a very pleasant 65 year old  man who has a history of an ischemic cardiomyopathy, status post initial  MI in 1993, and then a recurrent MI in 2001, both requiring  interventions and/or stenting.  The patient now had an EF of 25%.  He  also has moderate aortic stenosis, with a peak gradient of 50 mmHg.  The  patient works as a Naval architect.  He denies chest pain, shortness of  breath, and lightheadedness.  He has never had syncope, except with his  episode back in 2001.  He recently underwent exercise treadmill testing,  where he walked for 8 minutes on a Bruce protocol.   ADDITIONAL PAST MEDICAL HISTORY:  1. Problems with hernias in the past.  2. He has a history of hypertension.  3. He has dyslipidemia.   FAMILY HISTORY:  Notable for both parents being deceased, his father of  a brain hemorrhage and his mother of lung cancer.   MEDICATIONS:  1. Lisinopril 10 mg daily.  2. Lipitor 20 mg daily.  3. Metoprolol 50 mg twice daily.  4. Bayer aspirin.   SOCIAL HISTORY:  The patient works as a Engineer, maintenance and  continues to smoke half a pack of cigarettes per day and has done so for  50 years.  He denies alcohol abuse.   REVIEW OF SYSTEMS:  Notable for fatigue and constipation and some reflux  problems, but otherwise all systems were reviewed and found to be  negative.   PHYSICAL EXAMINATION:  GENERAL:  He is a pleasant, well-appearing,  middle-aged man in no acute distress.  VITAL SIGNS:  The blood pressure today was 122/78, the pulse was  68 and  regular, the respirations were 18, the weight was 170 pounds.  HEENT:  Normocephalic and atraumatic.  The pupils were equal and round.  The oropharynx was moist.  The sclerae were anicteric.  NECK:  Revealed no jugular venous distention.  There was no thyromegaly.  Trachea was midline.  The carotids were 2+ and symmetric.  LUNGS:  Clear bilaterally to auscultation.  No wheezes, rales, or  rhonchi were present.  There was no increased work of breathing.  CARDIOVASCULAR:  Demonstrated a 3/6 systolic murmur at the right upper  sternal border.  The aortic component of the second heart sound was  markedly decreased.  ABDOMEN:  Soft, nontender, nondistended.  There was no organomegaly.  The bowel sounds were present.  There was no rebound or guarding.  EXTREMITIES:  Demonstrated no cyanosis, clubbing, or edema.  The pulses  were 2+ and symmetric.  NEUROLOGIC:  Alert and oriented x3, with cranial nerves intact.  The  strength  was 5/5 and symmetric.   EKG demonstrates sinus rhythm, with a QRS duration of 120 msec.   IMPRESSION:  1. Ischemic cardiomyopathy.  2. Moderate aortic stenosis.  3. Remote history of syncope.   DISCUSSION:  I discussed treatment options with the patient, and the  risks, benefits, goals, and expectations of prophylactic ICD  implantation were discussed with the patient.  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  He will  call if he decides to proceed with ICD implant.     Doylene Canning. Ladona Ridgel, MD  Electronically Signed    GWT/MedQ  DD: 07/05/2006  DT: 07/06/2006  Job #: [id number]   cc:   Gerrit Friends. Dietrich Pates, MD, [facility]  Corrie Mckusick, M.D.

## 2010-06-07 NOTE — Assessment & Plan Note (Signed)
OFFICE VISIT   ROSA, GAMBALE A  DOB:  01-17-1946                                        November 05, 2008  CHART #:  [id number]   The patient returns to the office today following aortic valve  replacement with a 23 pericardial tissue valve and coronary artery  bypass grafting to the LAD and right coronary artery with endovein  harvesting on October 02, 2008.  Considering his preoperative risk  because of underlying pulmonary disease, the patient has done quite well  postoperatively.  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   On exam, his blood pressure 150/95, he notes it has not been running as  high at home and at other visits, pulse is 78, respiratory rate is 18,  and O2 sats 95%.  His aortic valve is without significant murmur.  There  is no murmur of aortic insufficiency.  He has very slight decrease in  breath sounds over the left base, clear over the right.  He has no pedal  edema.   Followup chest x-ray shows small left pleural effusion.   CURRENT MEDICATIONS:  The patient had previously been on lisinopril,  however, postoperatively with low blood pressure, this had been held;  aspirin 81 mg a day; carvedilol 25 mg b.i.d., and simvastatin 40 mg a  day.  He stopped his Mucinex.  He is also on Combivent inhaler 2 puffs  q.i.d.   I have discussed with the patient that if his blood pressure continues  to remain high, to contact Dr. Marvel Plan office for instructions about  restarting his ACE inhibitor, I suspect ultimately that  this will need to be done.  We do plan to see him back in 4 weeks with a  followup chest x-ray to ensure that the small left pleural effusion  completely resolves.   Sheliah Plane, MD  Electronically Signed   EG/MEDQ  D:  11/05/2008  T:  11/06/2008  Job:  [id number]   cc:   Gerrit Friends. Dietrich Pates, MD, [facility]

## 2010-06-07 NOTE — Letter (Signed)
February 26, 2008    Assunta Found, MD  [facility]  [address]  [place], [place]  [address]   RE:  HAYZE, GAZDA  MRN:  [id number]  /  DOB:  [birth year]   Dear Vonna Kotyk:   Mr. Pickar returns to the office for continued assessment and treatment  of ischemic cardiomyopathy and aortic stenosis.  Since his last visit,  he has been stable.  He continues to note dyspnea with mild-to-moderate  exertion.  He can walk a fair distance on flat ground or downhill, but  becomes weak, fatigued, and dyspneic when walking uphill.  He has had no  further episodes of loss of consciousness.  He has no chest discomfort  either at rest or with exercise.   CURRENT MEDICATIONS:  1. Lisinopril 10 mg daily.  2. Aspirin 81 mg daily.  3. Metoprolol 50 mg b.i.d.  4. Simvastatin 40 mg daily.  5. Carvedilol 25 mg b.i.d..   PHYSICAL EXAMINATION:  GENERAL:  A pleasant gentleman in no acute  distress.  VITAL SIGNS:  The heart rate is 70 and regular, respirations 12 and  unlabored, and blood pressure 120/60.  NECK:  No jugular venous distention.  LUNGS:  Clear.  CARDIAC:  Normal first heart sound; absent aortic component on the  second heart sound; harsh mid to late peaking systolic ejection murmur  heard widely across the precordium.  ABDOMEN:  Soft and nontender.  EXTREMITIES:  Trace edema.   Recent laboratory studies reviewed.  Lipid profile was excellent.  Chemistry profile and CBC normal.   Mr. Head reports that he discontinued cigarette smoking in August.  He  was congratulated on this accomplishment.  He notes that although he has  dyspnea with exertion, this is actually improved since he stopped  smoking.   IMPRESSION:  Mr. Eliot represents a difficult management problem with  ischemic cardiomyopathy, previously treated with percutaneous  intervention and significant aortic stenosis.  Due to his low long-term  cigarette smoking and probable associated chronic obstructive pulmonary  disease, his risk for surgical intervention is considerable.  He might  be a candidate for percutaneous aortic valve replacement, but this  procedure is not immediately available in this region.  At this point, I  am inclined to further defer surgical intervention.  This will be  verified by performing a stress test.  Unless exercise tolerance is  markedly impaired, we will continue medical therapy.  A formal pulmonary  function testing will be obtained to determine the extent of lung  disease.  Mr. Brum is cautioned not to excessively exert himself and  to call for increasing dyspnea, chest discomfort, or new episodes of  syncope.  I will plan to reassess this nice gentleman in 6 months.    Sincerely,      Gerrit Friends. Dietrich Pates, MD, [facility]  Electronically Signed    RMR/MedQ  DD: 02/26/2008  DT: 02/27/2008  Job #: [id number]   CC:    Corrie Mckusick, M.D.

## 2010-06-08 ENCOUNTER — Other Ambulatory Visit (HOSPITAL_COMMUNITY): Payer: Medicare Other

## 2010-06-09 ENCOUNTER — Ambulatory Visit (HOSPITAL_COMMUNITY)
Admission: RE | Admit: 2010-06-09 | Discharge: 2010-06-09 | Disposition: A | Discharge status: Home / Self Care | Payer: Medicare Other | Source: Ambulatory Visit | Attending: Cardiology | Admitting: Cardiology

## 2010-06-09 DIAGNOSIS — R0609 Other forms of dyspnea: Secondary | ICD-10-CM | POA: Insufficient documentation

## 2010-06-09 DIAGNOSIS — I10 Essential (primary) hypertension: Secondary | ICD-10-CM | POA: Insufficient documentation

## 2010-06-09 DIAGNOSIS — R0989 Other specified symptoms and signs involving the circulatory and respiratory systems: Secondary | ICD-10-CM

## 2010-06-09 DIAGNOSIS — R0602 Shortness of breath: Secondary | ICD-10-CM | POA: Insufficient documentation

## 2010-06-09 DIAGNOSIS — R0600 Dyspnea, unspecified: Secondary | ICD-10-CM

## 2010-06-09 DIAGNOSIS — Z95810 Presence of automatic (implantable) cardiac defibrillator: Secondary | ICD-10-CM | POA: Insufficient documentation

## 2010-06-09 DIAGNOSIS — J4489 Other specified chronic obstructive pulmonary disease: Secondary | ICD-10-CM | POA: Insufficient documentation

## 2010-06-09 DIAGNOSIS — J449 Chronic obstructive pulmonary disease, unspecified: Secondary | ICD-10-CM | POA: Insufficient documentation

## 2010-06-10 NOTE — Op Note (Signed)
NAMEHAZIEL, MOLNER                ACCOUNT NO.:  [id number]   MEDICAL RECORD NO.:  [id number]          PATIENT TYPE:  AMB   LOCATION:  DAY                           FACILITY:  [facility]   PHYSICIAN:  Dalia Heading, M.D.  DATE OF BIRTH:  15-Oct-1945   DATE OF PROCEDURE:  06/13/2006  DATE OF DISCHARGE:                               OPERATIVE REPORT   AGE:  65 years old.   PREOPERATIVE DIAGNOSIS:  Right inguinal hernia.   POSTOPERATIVE DIAGNOSIS:  Right inguinal hernia.   PROCEDURE:  Right inguinal herniorrhaphy.   SURGEON:  Dr. Franky Macho.   ANESTHESIA:  General.   INDICATIONS:  The patient is a 65 year old white male who presents with  a symptomatic right inguinal hernia.  The risks and benefits of the  procedure including bleeding, infection, pain, and recurrence from the  hernia were fully explained to the patient, who gave informed consent.   PROCEDURE NOTE:  The patient was placed in the supine position.  After  general anesthesia was administered, the right groin region was prepped  and draped using the usual sterile technique with Betadine.  Surgical  site confirmation was performed.   A transverse incision was made in the right groin region down to the  external oblique aponeurosis. The aponeurosis was incised to the  external ring.  The ilioinguinal nerve was identified and retracted  inferiorly from the operative field.  A Penrose drain was placed around  the spermatic cord.  The vas deferens was noted within the spermatic  cord.  A large direct hernia was found.  A lipoma of the cord was  excised without difficulty.  The direct hernia was freed away from the  spermatic cord up to the peritoneal reflection and inverted.  Two large  polypropylene mesh plugs were then placed into this region, secured to  the transversalis fascia using 2-0 Novofil interrupted sutures.  An  onlay polypropylene mesh patch was then placed along the floor in the  inguinal canal and secured  superiorly to the conjoined tendon and  inferiorly to the shelving edge of Poupart's ligament using 2-0 Novofil  interrupted sutures.  The internal ring was recreated using a 2-0  Novofil interrupted suture.  The external oblique aponeurosis was  reapproximated using a 2-0 Vicryl running suture.  The subcutaneous  layer was reapproximated using a 3-0 Vicryl interrupted suture.  The  skin was closed using a 4-0 Vicryl subcuticular suture.  0.5 %  Sensorcaine was instilled into the surrounding wound.  Dermabond was  then applied.   All tape and needle counts were correct at the end of the procedure.  The patient was awakened and transferred to PACU in stable condition.   COMPLICATIONS:  None.   SPECIMEN:  None.   BLOOD LOSS:  Minimal.      Dalia Heading, M.D.  Electronically Signed     MAJ/MEDQ  D:  06/13/2006  T:  06/13/2006  Job:  [id number]   cc:   Kirk Ruths, M.D.  Fax: [phone]
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
## 2010-06-10 NOTE — Procedures (Signed)
NAMEMESSIAH, AHR                ACCOUNT NO.:  [id number]   MEDICAL RECORD NO.:  [id number]          PATIENT TYPE:  OUT   LOCATION:  RAD                           FACILITY:  [facility]   PHYSICIAN:  Noralyn Pick. Eden Emms, MD, FACCDATE OF BIRTH:  28-Apr-1945   DATE OF PROCEDURE:  DATE OF DISCHARGE:                                ECHOCARDIOGRAM   Left ventricular cavity size was moderately dilated.  There was severe  LV dysfunction.  The EF was in the 25% range.  There was septal apical  and anterior wall akinesis.  Previously described mural apical thrombus  was not apparent.  The mitral valve was mildly thickened with trace MR.  There was mild left atrial enlargement.  Right-sided cardiac chambers  were normal.  There was no evidence of pulmonary hypertension.   The aortic valve was calcified.  There was moderate to severe aortic  stenosis.  The degree of aortic stenosis may be underestimated due to LV  dysfunction.  The peak aortic velocity was 3.8 meters per second with a  peak gradient of 58 mmHg and a mean gradient of 36 mmHg.   There was also mild to moderate aortic insufficiency.  The aortic root  was normal.  Subcostal imaging revealed no atrial septal defect, no  source of embolus, no effusion.   M-mode measurements included an aortic diameter of 31 mm, left atrial  dimension 36 mm, septal thickness 13 mm.  LVOT diameter 1.7 cm, aortic  velocity 3.8 meters per second, peak gradient 58 mmHg, mean gradient 36  mmHg.  LV diastolic dimension 56 mm, LV systolic dimension 46 mm.   FINAL IMPRESSION:  1. Moderate left ventricular cavity enlargement.  Ejection fraction      25%.  Findings consistent with previous anterior wall myocardial      infarction.  2. Moderate to severe aortic sclerosis with a mild to moderate aortic      regurgitation.  Suspect clinical correlation as the degree of      aortic stenosis may be worse, given left ventricular dysfunction.  3. Normal mitral valve.  4.  Normal right ventricle.  5. No pericardial effusion.   The patient should be considered for possible right and left heart  catheterization and also question of quantifying ejection fraction by  MRI as he appears to be a candidate for defibrillator.      Noralyn Pick. Eden Emms, MD, [facility]  Electronically Signed     PCN/MEDQ  D:  05/07/2006  T:  05/07/2006  Job:  [id number]   cc:   Corrie Mckusick, M.D.  Fax: [phone]   Gerrit Friends. Dietrich Pates, MD, [facility]  [address]  [place], [place] [address]

## 2010-06-10 NOTE — Procedures (Signed)
   [name], [name]                          ACCOUNT NO.:  [id number]   MEDICAL RECORD NO.:  [id number]                   PATIENT TYPE:  OUT   LOCATION:  RAD                                  FACILITY[facility]   PHYSICIAN:  Vida Roller, M.D.                DATE OF BIRTH:  March 16, 1945   DATE OF PROCEDURE:  10/06/2002  DATE OF DISCHARGE:                                    STRESS TEST   PROCEDURE:  Cardiolite stress test.   INDICATIONS FOR PROCEDURE:  [name] is a 65 year old male with known  coronary artery disease with history of anterior myocardial infarction in  1993, treated with emergency PTCA with reocclusion of the left anterior  descending.  He had a stent to his mid and proximal RCA in November 2000.  His most recent Cardiolite was in June 2001, which revealed no ischemia and  ejection fraction of 39%.  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   DATA REVIEWED:  EKG revealed sinus rhythm at 56 beats per minute with  inverted T waves in the inferolateral leads consistent with EKG on Jun 10, 2001.  Blood pressure 132/82.   The patient exercised for a total of 7 minutes and 37 seconds to Bruce  protocol stage III.  Maximum heart rate achieved was 152 beats per minute.  Maximum blood pressure was 190/88, which resolved down to 130/80 in  recovery.  EKG revealed no ischemic changes and few PACs.  The patient  denied any symptoms.   Final images and results are pending M.D. review.     Amy Mercy Riding, P.A. [facility]                     Vida Roller, M.D.    AB/MEDQ  D:  10/06/2002  T:  10/06/2002  Job:  [id number]
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
## 2010-06-10 NOTE — Procedures (Signed)
[name], [name]                ACCOUNT NO.:  [id number]   MEDICAL RECORD NO.:  [id number]          PATIENT TYPE:  OUT   LOCATION:  RESP                          FACILITY:  [facility]   PHYSICIAN:  Edward L. Juanetta Gosling, M.D.DATE OF BIRTH:  1945-11-25   DATE OF PROCEDURE:  DATE OF DISCHARGE:                            PULMONARY FUNCTION TEST   PULMONARY FUNCTION TEST   1. Spirometry shows a mild ventilatory defect with evidence of airflow      obstruction.  2. Lung volumes are normal.  3. DLCO is mildly reduced.  4. Arterial blood gases are normal.  5. There is no significant bronchodilator effect.   His study is consistent with COPD.      Edward L. Juanetta Gosling, M.D.  Electronically Signed     ELH/MEDQ  D:  03/04/2008  T:  03/04/2008  Job:  [id number]   cc:   Gerrit Friends. Dietrich Pates, MD, [facility]  [address]  [place], [place] [address]

## 2010-06-10 NOTE — Procedures (Signed)
   [name], [name]                          ACCOUNT NO.:  [id number]   MEDICAL RECORD NO.:  [id number]                   PATIENT TYPE:  OUT   LOCATION:  RAD                                  FACILITY[facility]  [facility]   PHYSICIAN:  Thomas C. Wall, M.D.                DATE OF BIRTH:  04-Jan-1946   DATE OF PROCEDURE:  DATE OF DISCHARGE:                                  ECHOCARDIOGRAM   REFERRING PHYSICIAN:  Annia Friendly. Loleta Chance, M.D. and Jesse Sans. Wall, M.D.   INDICATIONS FOR THE ECHOCARDIOGRAM:  412   TECHNICAL QUALITY:  The echocardiogram is of adequate technical quality.   CONCLUSIONS:  1. Normal left and right ventricular size.  2. Normal left ventricular chamber size with mid-to-distal septal and apical     akinesia with ejection fraction in the range of 45%.  There is a filling     defect at the apex consistent with a laminated clot or thrombus.  This is     most likely old considering his last anterior infarct was in 1993.  3. Normal mitral valve.  4. Mild aortic insufficiency with a thickened aortic valve that opens well.  5. Normal right-sided structures and function.  6. No pericardial effusion.                                               Thomas C. Wall, M.D.    TCW/MEDQ  D:  10/06/2002  T:  10/06/2002  Job:  [id number]   cc:   Annia Friendly. Loleta Chance, M.D.  [address]  [place]  [place] [address]  Fax: [phone]
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
## 2010-06-10 NOTE — Letter (Signed)
May 04, 2006    Corrie Mckusick, M.D.  [address] [place]. [place], [place] [address]   RE:  MAXEN, ROWLAND  MRN:  [id number]  /  DOB:  11/19/1945   Dear Vonna Kotyk:   Mr. Mcconaha returns to the office as scheduled for continued assessment  and treatment of coronary artery disease.  He continues to do well with  essentially no cardiopulmonary symptoms.  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  When he is required to  connect his vehicle to a trailer, he sometimes experiences exertional  dyspnea.  He continues to smoke, but once again is tapering the usage.  Blood pressure control has been good.   CURRENT MEDICATIONS:  Include:  1. Aspirin 81 mg daily.  2. Lisinopril 10 mg daily.  3. Atorvastatin 20 mg daily.  4. Metoprolol 100 mg daily.   EXAMINATION:  A pleasant, trim gentleman in no acute distress.  The  weight is 185,  9 pounds less than in February 2007. Blood pressure  110/75, heart rate 60 and regular.  Respiration 16.  NECK:  No jugular venous distention; normal carotid upstrokes with low  pitched transmitted murmur prominently bilaterally.  LUNGS:  Clear.  CARDIAC:  Normal 1st heart sound.  Slightly decreased in intensity of  2nd heart sound.  Grade 3/6 systolic ejection murmur at the cardiac  base.  No diastolic murmur appreciated.  ABDOMEN:  Soft, nontender; no organomegaly.  EXTREMITIES:  No edema; distal pulses intact.   Most recent lipid profile was obtained in 2006.  Values were excellent  at that time.   IMPRESSION:  Mr. Marinaro is doing generally well.  We discussed aortic  valve disease at length and the likelihood for slow progression.  For  now, his stenosis and insufficiency appear mild.  An echocardiogram will  be obtained to verify this impression.   Mr. Weidler was advised to discontinue all cigarette use.  The importance  of this for both his lungs and heart was explained.   We will obtain a lipid profile and a chemistry profile.   His  prescriptions were renewed by mail for the next 12 months.  He has paper  work for the [employer] that will be filled out and  sent.    Sincerely,      Gerrit Friends. Dietrich Pates, MD, [facility]  Electronically Signed    RMR/MedQ  DD: 05/04/2006  DT: 05/04/2006  Job #: [id number]

## 2010-06-10 NOTE — Procedures (Signed)
[name], [name]                ACCOUNT NO.:  [id number]   MEDICAL RECORD NO.:  [id number]          PATIENT TYPE:  OUT   LOCATION:  RAD                           [facility]   PHYSICIAN:  Noralyn Pick. Eden Emms, MD, FACCDATE OF BIRTH:  Dec 03, 1945   DATE OF PROCEDURE:  DATE OF DISCHARGE:                                ECHOCARDIOGRAM   Audio too short to transcribe (less than 5 seconds)      Peter C. Eden Emms, MD, [facility]     PCN/MEDQ  D:  05/07/2006  T:  05/07/2006  Job:  [id number]
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
## 2010-06-14 ENCOUNTER — Ambulatory Visit (INDEPENDENT_AMBULATORY_CARE_PROVIDER_SITE_OTHER): Payer: Medicare Other | Admitting: Gastroenterology

## 2010-06-14 ENCOUNTER — Encounter: Payer: Self-pay | Admitting: Gastroenterology

## 2010-06-14 VITALS — BP 132/77 | HR 56 | Temp 97.8°F | Ht 71.0 in | Wt 201.8 lb

## 2010-06-14 DIAGNOSIS — K625 Hemorrhage of anus and rectum: Secondary | ICD-10-CM | POA: Insufficient documentation

## 2010-06-14 DIAGNOSIS — Z1211 Encounter for screening for malignant neoplasm of colon: Secondary | ICD-10-CM
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
## 2010-06-15 ENCOUNTER — Encounter: Payer: Self-pay | Admitting: Gastroenterology

## 2010-06-15 ENCOUNTER — Encounter: Payer: Self-pay | Admitting: *Deleted

## 2010-06-15 DIAGNOSIS — Z1211 Encounter for screening for malignant neoplasm of colon: Secondary | ICD-10-CM | POA: Insufficient documentation

## 2010-06-15 NOTE — Assessment & Plan Note (Signed)
Small volume toliet tissue hematochezia recently. No prior colonoscopy. Colonoscopy to be done in the near future. We have notified ENDO that the patient has a defibrillator. He had tissue heart valve replacement in 2009. No indication for SBE prophylaxis.  I have discussed the risks, alternatives, benefits with regards to but not limited to the risk of reaction to medication, bleeding, infection, perforation and the patient is agreeable to proceed. Written consent to be obtained.

## 2010-06-15 NOTE — Progress Notes (Signed)
Cc to PCP 

## 2010-06-23 ENCOUNTER — Encounter: Payer: Self-pay | Admitting: Internal Medicine

## 2010-06-23 ENCOUNTER — Ambulatory Visit (INDEPENDENT_AMBULATORY_CARE_PROVIDER_SITE_OTHER): Payer: Medicare Other | Admitting: Internal Medicine

## 2010-06-23 DIAGNOSIS — I10 Essential (primary) hypertension: Secondary | ICD-10-CM

## 2010-06-23 DIAGNOSIS — I2589 Other forms of chronic ischemic heart disease: Secondary | ICD-10-CM

## 2010-06-23 DIAGNOSIS — Z95810 Presence of automatic (implantable) cardiac defibrillator: Secondary | ICD-10-CM

## 2010-06-23 NOTE — Assessment & Plan Note (Signed)
His blood pressure is well controlled. He'll continue his current medications. I've asked that he maintain a low-sodium diet.

## 2010-06-23 NOTE — Patient Instructions (Signed)
Your physician recommends that you schedule a follow-up appointment in: 1 year  

## 2010-06-23 NOTE — Assessment & Plan Note (Signed)
He denies anginal symptoms. Continue current medications.
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
## 2010-06-23 NOTE — Assessment & Plan Note (Signed)
His device is working normally. Will recheck in several months. 

## 2010-06-28 ENCOUNTER — Ambulatory Visit (HOSPITAL_COMMUNITY)
Admission: RE | Admit: 2010-06-28 | Discharge: 2010-06-28 | Disposition: A | Discharge status: Home / Self Care | Payer: Medicare Other | Source: Ambulatory Visit | Attending: Internal Medicine | Admitting: Internal Medicine

## 2010-06-28 ENCOUNTER — Encounter: Payer: Medicare Other | Admitting: Internal Medicine

## 2010-06-28 ENCOUNTER — Other Ambulatory Visit: Payer: Self-pay | Admitting: Internal Medicine

## 2010-06-28 DIAGNOSIS — Z950 Presence of cardiac pacemaker: Secondary | ICD-10-CM | POA: Insufficient documentation

## 2010-06-28 DIAGNOSIS — D126 Benign neoplasm of colon, unspecified: Secondary | ICD-10-CM | POA: Insufficient documentation

## 2010-06-28 DIAGNOSIS — K921 Melena: Secondary | ICD-10-CM | POA: Insufficient documentation

## 2010-06-28 DIAGNOSIS — E785 Hyperlipidemia, unspecified: Secondary | ICD-10-CM | POA: Insufficient documentation

## 2010-06-28 DIAGNOSIS — Z1211 Encounter for screening for malignant neoplasm of colon: Secondary | ICD-10-CM

## 2010-06-28 DIAGNOSIS — Z8371 Family history of colonic polyps: Secondary | ICD-10-CM | POA: Insufficient documentation

## 2010-06-28 DIAGNOSIS — K5730 Diverticulosis of large intestine without perforation or abscess without bleeding: Secondary | ICD-10-CM | POA: Insufficient documentation

## 2010-06-28 DIAGNOSIS — Z83719 Family history of colon polyps, unspecified: Secondary | ICD-10-CM | POA: Insufficient documentation

## 2010-06-28 DIAGNOSIS — Z951 Presence of aortocoronary bypass graft: Secondary | ICD-10-CM | POA: Insufficient documentation

## 2010-06-30 ENCOUNTER — Encounter: Payer: Self-pay | Admitting: Internal Medicine

## 2010-07-04 ENCOUNTER — Encounter: Payer: Self-pay | Admitting: Internal Medicine

## 2010-08-08 NOTE — Op Note (Signed)
  [name], [name]                ACCOUNT NO.:  [id number]  MEDICAL RECORD NO.:  [id number]  LOCATION:  [facility]                          FACILITY:  [facility]  PHYSICIAN:  R. Roetta Sessions, MD FACP FACGDATE OF BIRTH:  [birth year]  DATE OF PROCEDURE:  06/28/2010 DATE OF DISCHARGE:                              OPERATIVE REPORT   PROCEDURE:  Colonoscopy, diagnostic.  INDICATIONS FOR PROCEDURE:  The patient is a 65 year old gentleman, referred by Dr. Phillips Odor, for his first ever colonoscopy.  Family history of polyps in a brother possibly.  Low-volume painless hematochezia in the distant past, but none lately, no bowel symptoms at all these studies.  Colonoscopy is now being done.  Risks, benefits, limitations, alternatives, imponderables have been discussed, questions answered. Please see the documentation medical record.  PROCEDURE NOTE:  O2 saturation, blood pressure, pulse, and respirations were monitored throughout the entire procedure.  CONSCIOUS SEDATION:  Versed 3 mg IV and Demerol 75 mg IV in divided doses.  INSTRUMENT:  Pentax video chip system.  FINDINGS:  Digital rectal exam revealed no abnormalities.  Endoscopic findings:  Prep was adequate.  Colon:  Colonic mucosa was surveyed from the rectosigmoid junction through the left transverse right colon to the appendiceal orifice, ileocecal valve/cecum.  These structures were well seen and photographed for the record.  From this level, scope slowly and cautiously withdrawn.  All previously mentioned mucosal surfaces were again seen.  The patient was noted have scattered left-sided diverticula.  There was a 4-mm polyp in the mid descending colon which cold biopsied/remainder of colonic mucosa appeared normal.  Scope was pulled down the rectum where thorough examination of the rectal mucosa including retroflexion view of the anal verge demonstrated no abnormalities.  The patient tolerated the procedure well.  Cecal withdrawal time  14 minutes.  Please note his AICD was not manipulated.  RECOMMENDATIONS: 1. Diverticulosis and polyp literature provided to Mr. Pryce. 2. Follow up on path. 3. Further recommendations to follow.     Jonathon Bellows, MD FACP [facility]     RMR/MEDQ  D:  06/28/2010  T:  06/28/2010  Job:  [id number]  cc:   Dr. Suann Larry. Ladona Ridgel, MD [address]  [address] [place] [address]  Electronically Signed by Lorrin Goodell M.D. on 08/08/2010 08:37:58 AM

## 2010-09-21 ENCOUNTER — Other Ambulatory Visit: Payer: Self-pay | Admitting: Internal Medicine

## 2010-09-22 ENCOUNTER — Ambulatory Visit (INDEPENDENT_AMBULATORY_CARE_PROVIDER_SITE_OTHER): Payer: Medicare Other | Admitting: *Deleted

## 2010-09-22 DIAGNOSIS — I428 Other cardiomyopathies: Secondary | ICD-10-CM

## 2010-09-27 ENCOUNTER — Other Ambulatory Visit: Payer: Self-pay

## 2010-09-27 MED ORDER — SIMVASTATIN 40 MG PO TABS: 40.0000 mg | ORAL_TABLET | Freq: Every day | ORAL | Status: DC

## 2010-09-27 MED ORDER — CARVEDILOL 12.5 MG PO TABS: 12.5000 mg | ORAL_TABLET | Freq: Two times a day (BID) | ORAL | Status: DC

## 2010-09-27 NOTE — Telephone Encounter (Signed)
.   Requested Prescriptions   Pending Prescriptions Disp Refills  . simvastatin (ZOCOR) 40 MG tablet 30 tablet 6    Sig: Take 1 tablet (40 mg total) by mouth at bedtime.   Signed Prescriptions Disp Refills  . carvedilol (COREG) 12.5 MG tablet 60 tablet 6    Sig: Take 1 tablet (12.5 mg total) by mouth 2 (two) times daily with a meal.    Authorizing Provider: Kathlen Brunswick.    Ordering User: Lacie Scotts

## 2010-10-05 ENCOUNTER — Encounter: Payer: Self-pay | Admitting: *Deleted

## 2010-10-07 NOTE — Progress Notes (Signed)
ICD checked by remote. 

## 2010-12-22 ENCOUNTER — Other Ambulatory Visit: Payer: Self-pay | Admitting: Internal Medicine

## 2010-12-22 ENCOUNTER — Ambulatory Visit (INDEPENDENT_AMBULATORY_CARE_PROVIDER_SITE_OTHER): Payer: Medicare Other | Admitting: *Deleted

## 2010-12-22 ENCOUNTER — Encounter: Payer: Self-pay | Admitting: Internal Medicine

## 2010-12-22 DIAGNOSIS — Z95810 Presence of automatic (implantable) cardiac defibrillator: Secondary | ICD-10-CM

## 2010-12-22 DIAGNOSIS — I428 Other cardiomyopathies: Secondary | ICD-10-CM

## 2010-12-23 LAB — REMOTE ICD DEVICE
BRDY-0002RV: 40 {beats}/min
DEV-0020ICD: NEGATIVE
RV LEAD AMPLITUDE: 4 mv
TOT-0006: 20120830000000
TZAT-0001FASTVT: 1
TZAT-0001SLOWVT: 1
TZAT-0001SLOWVT: 2
TZAT-0002FASTVT: NEGATIVE
TZAT-0002SLOWVT: NEGATIVE
TZAT-0002SLOWVT: NEGATIVE
TZAT-0004FASTVT: 8
TZAT-0012FASTVT: 200 ms
TZAT-0018SLOWVT: NEGATIVE
TZAT-0018SLOWVT: NEGATIVE
TZAT-0019FASTVT: 8 V
TZAT-0019SLOWVT: 8 V
TZAT-0019SLOWVT: 8 V
TZAT-0020FASTVT: 1.6 ms
TZAT-0020SLOWVT: 1.6 ms
TZAT-0020SLOWVT: 1.6 ms
TZON-0005SLOWVT: 24
TZON-0008FASTVT: 0 ms
TZON-0008SLOWVT: 0 ms
TZST-0001FASTVT: 2
TZST-0001FASTVT: 4
TZST-0001FASTVT: 6
TZST-0001SLOWVT: 3
TZST-0001SLOWVT: 4
TZST-0002FASTVT: NEGATIVE
TZST-0002FASTVT: NEGATIVE
TZST-0002FASTVT: NEGATIVE
TZST-0002SLOWVT: NEGATIVE
TZST-0002SLOWVT: NEGATIVE
TZST-0002SLOWVT: NEGATIVE
TZST-0003FASTVT: 25 J
TZST-0003FASTVT: 35 J
TZST-0003FASTVT: 35 J
TZST-0003FASTVT: 35 J
TZST-0003SLOWVT: 35 J
VENTRICULAR PACING ICD: 0 pct

## 2010-12-27 NOTE — Progress Notes (Signed)
Remote icd check  

## 2010-12-30 ENCOUNTER — Encounter: Payer: Self-pay | Admitting: *Deleted

## 2011-03-30 ENCOUNTER — Encounter: Payer: Self-pay | Admitting: Internal Medicine

## 2011-03-30 ENCOUNTER — Ambulatory Visit (INDEPENDENT_AMBULATORY_CARE_PROVIDER_SITE_OTHER): Payer: Medicare Other | Admitting: *Deleted

## 2011-03-30 DIAGNOSIS — I428 Other cardiomyopathies: Secondary | ICD-10-CM | POA: Diagnosis not present

## 2011-03-31 LAB — REMOTE ICD DEVICE
BRDY-0002RV: 40 {beats}/min
RV LEAD AMPLITUDE: 4 mv
TZAT-0001FASTVT: 1
TZAT-0002FASTVT: NEGATIVE
TZAT-0002SLOWVT: NEGATIVE
TZAT-0002SLOWVT: NEGATIVE
TZAT-0004FASTVT: 8
TZAT-0004SLOWVT: 6
TZAT-0004SLOWVT: 8
TZAT-0005FASTVT: 88 pct
TZAT-0012SLOWVT: 200 ms
TZAT-0012SLOWVT: 200 ms
TZAT-0019SLOWVT: 8 V
TZAT-0020SLOWVT: 1.6 ms
TZAT-0020SLOWVT: 1.6 ms
TZON-0003SLOWVT: 350 ms
TZON-0011AFLUTTER: 70
TZST-0001FASTVT: 2
TZST-0001FASTVT: 4
TZST-0001FASTVT: 5
TZST-0001SLOWVT: 3
TZST-0001SLOWVT: 5
TZST-0002FASTVT: NEGATIVE
TZST-0002FASTVT: NEGATIVE
TZST-0002FASTVT: NEGATIVE
TZST-0002SLOWVT: NEGATIVE
TZST-0002SLOWVT: NEGATIVE
TZST-0002SLOWVT: NEGATIVE
TZST-0003FASTVT: 35 J
TZST-0003FASTVT: 35 J
TZST-0003SLOWVT: 15 J
TZST-0003SLOWVT: 35 J

## 2011-04-05 NOTE — Progress Notes (Signed)
Remote icd check  

## 2011-04-17 ENCOUNTER — Encounter: Payer: Self-pay | Admitting: Internal Medicine

## 2011-04-24 ENCOUNTER — Encounter: Payer: Self-pay | Admitting: *Deleted

## 2011-05-19 ENCOUNTER — Encounter: Payer: Self-pay | Admitting: Adult Health

## 2011-05-19 ENCOUNTER — Ambulatory Visit (INDEPENDENT_AMBULATORY_CARE_PROVIDER_SITE_OTHER): Payer: Medicare Other | Admitting: Adult Health

## 2011-05-19 VITALS — BP 143/78 | HR 58 | Resp 16 | Ht 71.0 in | Wt 202.0 lb

## 2011-05-19 DIAGNOSIS — J449 Chronic obstructive pulmonary disease, unspecified: Secondary | ICD-10-CM | POA: Diagnosis not present

## 2011-05-19 DIAGNOSIS — I2589 Other forms of chronic ischemic heart disease: Secondary | ICD-10-CM

## 2011-05-19 DIAGNOSIS — J4489 Other specified chronic obstructive pulmonary disease: Secondary | ICD-10-CM

## 2011-05-19 NOTE — Assessment & Plan Note (Addendum)
He seems stable from CV standpoint. I have reviewed his last cardiac cath, echo and  Stress test;.  He states that his breathing status is worsening. I had him walk in the office for 5 minutues for a O2 sat and he was not found to desaturate. Saturation 94% to 96%. He has had a history of COPD but recent PFT's in May of 2012 were negative for this. He has not had any complaints of palpitations or racing heart rate.  I will have his PMK interrogated to make sure his dizziness and DOE is not related to transient ventricular arrhythmias.  He has not had recent labs, CMET, lipids, and CBC will be completed to evaluate kidney fx, for anemia, and ongoing risk stratification. He may need to be placed on low dose diuretic with hx of systolic dysfunction with ongoing DOE.

## 2011-05-19 NOTE — Assessment & Plan Note (Signed)
He may have progression of this even though his PFT's did not confirm COPD in May of 2012.  He is advised to take OTC mucinex and Claritin for symptomatic relief of his shortness of breath when he is outside. It may be environmental as the pollen has been especially thick in the air these last few weeks. He is advised to follow-up with a pulmonolgist-Dr.Henderson- in [place], where his wife goes for her lung issues.
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
## 2011-05-19 NOTE — Patient Instructions (Signed)
Your physician recommends that you schedule a follow-up appointment in:  1 - Pacemaker interrogation at next available time 2 - 1 month with Dr Dietrich Pates  Your physician recommends that you return for lab work in: Within the week  Your physician has recommended you make the following change in your medication:  1 - May take Claritin 10 mg daily as needed 2 - Mucinex 600 mg 2 tablets twice a day as needed

## 2011-05-19 NOTE — Progress Notes (Signed)
Addended by: Reather Laurence A on: 05/19/2011 03:23 PM   Modules accepted: Orders

## 2011-05-22 ENCOUNTER — Encounter: Payer: Self-pay | Admitting: *Deleted

## 2011-05-22 DIAGNOSIS — I2589 Other forms of chronic ischemic heart disease: Secondary | ICD-10-CM | POA: Diagnosis not present

## 2011-05-22 DIAGNOSIS — J449 Chronic obstructive pulmonary disease, unspecified: Secondary | ICD-10-CM | POA: Diagnosis not present

## 2011-05-22 LAB — LIPID PANEL
LDL Cholesterol: 50 mg/dL (ref 0–99)
Triglycerides: 76 mg/dL (ref <150)
VLDL: 15 mg/dL (ref 0–40)

## 2011-05-22 LAB — BASIC METABOLIC PANEL
BUN: 17 mg/dL (ref 6–23)
CO2: 31 mEq/L (ref 19–32)
Glucose, Bld: 116 mg/dL — ABNORMAL HIGH (ref 70–99)
Potassium: 4.9 mEq/L (ref 3.5–5.3)
Sodium: 141 mEq/L (ref 135–145)

## 2011-05-22 LAB — CBC
HCT: 48.4 % (ref 39.0–52.0)
Hemoglobin: 16.3 g/dL (ref 13.0–17.0)
MCH: 31.5 pg (ref 26.0–34.0)
MCHC: 33.7 g/dL (ref 30.0–36.0)
RBC: 5.18 MIL/uL (ref 4.22–5.81)

## 2011-05-22 LAB — HEPATIC FUNCTION PANEL
Albumin: 4.9 g/dL (ref 3.5–5.2)
Alkaline Phosphatase: 60 U/L (ref 39–117)
Indirect Bilirubin: 0.4 mg/dL (ref 0.0–0.9)
Total Bilirubin: 0.6 mg/dL (ref 0.3–1.2)
Total Protein: 6.9 g/dL (ref 6.0–8.3)

## 2011-05-25 ENCOUNTER — Other Ambulatory Visit: Payer: Self-pay | Admitting: *Deleted

## 2011-05-25 MED ORDER — SIMVASTATIN 40 MG PO TABS: 40.0000 mg | ORAL_TABLET | Freq: Every day | ORAL | Status: DC

## 2011-05-25 MED ORDER — CARVEDILOL 12.5 MG PO TABS: 12.5000 mg | ORAL_TABLET | Freq: Two times a day (BID) | ORAL | Status: DC

## 2011-05-26 ENCOUNTER — Ambulatory Visit (INDEPENDENT_AMBULATORY_CARE_PROVIDER_SITE_OTHER): Payer: Medicare Other | Admitting: *Deleted

## 2011-05-26 ENCOUNTER — Encounter: Payer: Self-pay | Admitting: Internal Medicine

## 2011-05-26 DIAGNOSIS — I2589 Other forms of chronic ischemic heart disease: Secondary | ICD-10-CM | POA: Diagnosis not present

## 2011-05-26 LAB — ICD DEVICE OBSERVATION
DEV-0020ICD: NEGATIVE
FVT: 0
PACEART VT: 0
RV LEAD AMPLITUDE: 6 mv
RV LEAD IMPEDENCE ICD: 520 Ohm
TZAT-0002SLOWVT: NEGATIVE
TZAT-0002SLOWVT: NEGATIVE
TZAT-0005SLOWVT: 84 pct
TZAT-0005SLOWVT: 91 pct
TZAT-0011SLOWVT: 10 ms
TZAT-0011SLOWVT: 10 ms
TZAT-0012FASTVT: 200 ms
TZAT-0012SLOWVT: 200 ms
TZAT-0012SLOWVT: 200 ms
TZAT-0013FASTVT: 1
TZAT-0013SLOWVT: 3
TZAT-0013SLOWVT: 3
TZAT-0018SLOWVT: NEGATIVE
TZAT-0018SLOWVT: NEGATIVE
TZAT-0019FASTVT: 8 V
TZAT-0019SLOWVT: 8 V
TZAT-0019SLOWVT: 8 V
TZAT-0020FASTVT: 1.6 ms
TZON-0003FASTVT: 240 ms
TZON-0004SLOWVT: 28
TZON-0005SLOWVT: 24
TZON-0008FASTVT: 0 ms
TZON-0008SLOWVT: 0 ms
TZST-0001FASTVT: 3
TZST-0001FASTVT: 4
TZST-0001FASTVT: 6
TZST-0001SLOWVT: 4
TZST-0001SLOWVT: 6
TZST-0002FASTVT: NEGATIVE
TZST-0002FASTVT: NEGATIVE
TZST-0002FASTVT: NEGATIVE
TZST-0002SLOWVT: NEGATIVE
TZST-0002SLOWVT: NEGATIVE
TZST-0003FASTVT: 35 J
TZST-0003FASTVT: 35 J
TZST-0003SLOWVT: 25 J
TZST-0003SLOWVT: 35 J
TZST-0003SLOWVT: 35 J
VENTRICULAR PACING ICD: 0 pct
VF: 0

## 2011-05-26 NOTE — Progress Notes (Signed)
ICD check 

## 2011-06-13 ENCOUNTER — Encounter: Payer: Self-pay | Admitting: Cardiology

## 2011-06-13 ENCOUNTER — Encounter: Payer: Self-pay | Admitting: *Deleted

## 2011-06-13 ENCOUNTER — Ambulatory Visit (INDEPENDENT_AMBULATORY_CARE_PROVIDER_SITE_OTHER): Payer: Medicare Other | Admitting: Cardiology

## 2011-06-13 VITALS — BP 132/75 | HR 56 | Resp 16 | Ht 71.0 in | Wt 200.0 lb

## 2011-06-13 DIAGNOSIS — I359 Nonrheumatic aortic valve disorder, unspecified: Secondary | ICD-10-CM | POA: Diagnosis not present

## 2011-06-13 DIAGNOSIS — R0602 Shortness of breath: Secondary | ICD-10-CM

## 2011-06-13 DIAGNOSIS — I350 Nonrheumatic aortic (valve) stenosis: Secondary | ICD-10-CM

## 2011-06-13 DIAGNOSIS — Z95810 Presence of automatic (implantable) cardiac defibrillator: Secondary | ICD-10-CM | POA: Diagnosis not present

## 2011-06-13 DIAGNOSIS — D492 Neoplasm of unspecified behavior of bone, soft tissue, and skin: Secondary | ICD-10-CM

## 2011-06-13 DIAGNOSIS — I10 Essential (primary) hypertension: Secondary | ICD-10-CM | POA: Diagnosis not present

## 2011-06-13 DIAGNOSIS — Z87891 Personal history of nicotine dependence: Secondary | ICD-10-CM

## 2011-06-13 DIAGNOSIS — F17201 Nicotine dependence, unspecified, in remission: Secondary | ICD-10-CM

## 2011-06-13 DIAGNOSIS — I2589 Other forms of chronic ischemic heart disease: Secondary | ICD-10-CM

## 2011-06-13 DIAGNOSIS — E785 Hyperlipidemia, unspecified: Secondary | ICD-10-CM

## 2011-06-13 MED ORDER — LISINOPRIL 2.5 MG PO TABS: 2.5000 mg | ORAL_TABLET | Freq: Every day | ORAL | Status: DC

## 2011-06-13 MED ORDER — LISINOPRIL 2.5 MG PO TABS: ORAL_TABLET | ORAL | Status: DC

## 2011-06-13 NOTE — Assessment & Plan Note (Signed)
Fairly prominent murmur on exam, but no other physical findings to indicate significant aortic stenosis.  Echocardiogram in 08/2009 found a normally functioning bioprosthetic valve except for the presence of trivial aortic insufficiency.

## 2011-06-13 NOTE — Assessment & Plan Note (Signed)
Patient notes that he is sometimes tempted to smoke a cigarette, but has not done so for a number of years.  He is congratulated on his successful effort to stop tobacco use.

## 2011-06-13 NOTE — Assessment & Plan Note (Signed)
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  The patient has done well with current medications, but ideally should be treated with an ACE inhibitor.  He already has mild orthostatic dizziness and a relatively low systolic blood pressure, but we will start at very low dose therapy and titrate gradually.

## 2011-06-13 NOTE — Progress Notes (Signed)
Patient ID: [name], male   DOB: [birth year], 66 y.o.   MRN: [id number]  HPI: Scheduled return visit for this very nice gentleman with ischemic cardiomyopathy, AICD and chronic obstructive pulmonary disease.  Since combined CABG/MVR surgery in 2010 he has had class 2-3 dyspnea on exertion that has not been progressive.  Otherwise, he is doing well with no new medical problems and no new symptoms.  Prior to Admission medications   Medication Sig Start Date End Date Taking? Authorizing Provider  acetaminophen (TYLENOL) 500 MG tablet Take 500 mg by mouth every 6 (six) hours as needed.     Yes Historical Provider, MD  aspirin 81 MG tablet Take 81 mg by mouth daily.     Yes Historical Provider, MD  carvedilol (COREG) 12.5 MG tablet Take 1 tablet (12.5 mg total) by mouth 2 (two) times daily with a meal. 05/25/11  Yes Kathlen Brunswick, MD  loratadine (CLARITIN) 10 MG tablet Take 10 mg by mouth daily as needed.   Yes Historical Provider, MD  nitroGLYCERIN (NITROLINGUAL) 0.4 MG/SPRAY spray Place 1 spray under the tongue every 5 (five) minutes as needed.   Yes Historical Provider, MD  simvastatin (ZOCOR) 40 MG tablet Take 1 tablet (40 mg total) by mouth at bedtime. 05/25/11  Yes Kathlen Brunswick, MD  lisinopril (PRINIVIL,ZESTRIL) 2.5 MG tablet Take 1 tablet by mouth for 2 weeks and increase to 2 tablets daily thereafter, provided your systolic blood pressure is at least 100 06/13/11   Kathlen Brunswick, MD   Allergies  Allergen Reactions  . Cefazolin     REACTION: rash      Past medical history, social history, and family history reviewed and updated.  ROS: Denies chest pain, orthopnea, PND, pedal edema, palpitations or syncope.  All other systems reviewed and are negative.  PHYSICAL EXAM: BP 132/75  Pulse 56  Resp 16  Ht 5\' 11"  (1.803 m)  Wt 90.719 kg (200 lb)  BMI 27.89 kg/m2  General-Well developed; no acute distress Body habitus-proportionate weight and height Neck-No JVD; no carotid  bruits Lungs-clear lung fields; resonant to percussion Cardiovascular-normal PMI; normal S1 and S2; grade 2-3/6 systolic murmur at the cardiac base with a preserved A2 Abdomen-normal bowel sounds; soft and non-tender without masses or organomegaly Musculoskeletal-No deformities, no cyanosis or clubbing Neurologic-Normal cranial nerves; symmetric strength and tone Skin-Warm, no significant lesions Extremities-1+ distal pulses; no edema  ASSESSMENT AND PLAN:  [name] Bing, MD 06/13/2011 4:19 PM

## 2011-06-13 NOTE — Progress Notes (Deleted)
Name: [name]    DOB: 06/20/1945  Age: 66 y.o.  MR#: [id number]       PCP:  Colette Ribas, MD, MD      Insurance: @PAYORNAME @   CC:    Chief Complaint  Patient presents with  . Appointment    no complaints +meds      needs refill on ntg spray    VS BP 132/75  Pulse 56  Resp 16  Ht 5\' 11"  (1.803 m)  Wt 200 lb (90.719 kg)  BMI 27.89 kg/m2  Weights Current Weight  06/13/11 200 lb (90.719 kg)  05/19/11 202 lb (91.627 kg)  06/23/10 202 lb (91.627 kg)    Blood Pressure  BP Readings from Last 3 Encounters:  06/13/11 132/75  05/19/11 143/78  06/23/10 116/74     Admit date:  (Not on file) Last encounter with RMR:  Visit date not found   Allergy Allergies  Allergen Reactions  . Cefazolin     REACTION: rash    Current Outpatient Prescriptions  Medication Sig Dispense Refill  . acetaminophen (TYLENOL) 500 MG tablet Take 500 mg by mouth every 6 (six) hours as needed.        Marland Kitchen aspirin 81 MG tablet Take 81 mg by mouth daily.        . carvedilol (COREG) 12.5 MG tablet Take 1 tablet (12.5 mg total) by mouth 2 (two) times daily with a meal.  60 tablet  6  . loratadine (CLARITIN) 10 MG tablet Take 10 mg by mouth daily as needed.      . nitroGLYCERIN (NITROLINGUAL) 0.4 MG/SPRAY spray Place 1 spray under the tongue every 5 (five) minutes as needed.      . simvastatin (ZOCOR) 40 MG tablet Take 1 tablet (40 mg total) by mouth at bedtime.  30 tablet  6    Discontinued Meds:    Medications Discontinued During This Encounter  Medication Reason  . nitroGLYCERIN (NITROSTAT) 0.4 MG SL tablet Error  . guaiFENesin (MUCINEX) 600 MG 12 hr tablet Error    Patient Active Problem List  Diagnoses  . NEOPLASM, SKIN  . CARDIOMYOPATHY, ISCHEMIC  . ATHEROSCLEROTIC CARDIOVASCULAR DISEASE  . CHRONIC OBSTRUCTIVE PULMONARY DISEASE  . Aortic stenosis  . Hypertension  . AICD (automatic cardioverter/defibrillator) present  . Carotid bruit  . Pancreatitis  . Hyperlipidemia  . Tobacco  abuse, in remission  . Rectal bleeding  . Colon cancer screening    LABS Clinical Support on 05/26/2011  Component Date Value  . DEVICE MODEL ICD 05/26/2011 [id number]   . DEV-0014ICD 05/26/2011 Lewayne Bunting   M.D.   . Nira Conn 05/26/2011 N   . [id number] 05/26/2011 Lewayne Bunting   M.D.   . PACEART TECH NOTES ICD 05/26/2011                     Value:ICD check in clinic. Normal device function. Thresholds and sensing consistent with previous device measurements. Impedance trends stable over time. No evidence of any ventricular arrhythmias. No mode switches. Histogram distribution appropriate for                          patient and level of activity. No changes made this session. Device programmed at appropriate safety margins. Device programmed to optimize intrinsic conduction.  Patient education completed including shock plan. Alert tones/vibration demonstrated for  patient.  ROV 3 months with Dr.Taylor in [place].  Marland Kitchen CHARGE TIME 05/26/2011 8.22   . RV LEAD IMPEDENCE ICD 05/26/2011 520   . HV IMPEDENCE 05/26/2011 52/68   . BATTERY VOLTAGE 05/26/2011 3.06   . VF 05/26/2011 0   . FVT 05/26/2011 0   . PACEART VT 05/26/2011 0   . VENTRICULAR PACING ICD 05/26/2011 0   . RV LEAD AMPLITUDE 05/26/2011 6.0   . RV LEAD THRESHOLD 05/26/2011 1   . BRDY-0001RV 05/26/2011 VVI   . BRDY-0002RV 05/26/2011 40   . BRDY-0005RV 05/26/2011 Off   . BRDY-0009RV 05/26/2011 No   . TACH-0002RV 05/26/2011 N/A   . TACH-0003RV 05/26/2011 Enabled   . TZON-0002FASTVT 05/26/2011 Disabled   . TZON-0003FASTVT 05/26/2011 240   . TZON-0008FASTVT 05/26/2011 0 min   . TZON-0010FASTVT 05/26/2011 Off   . TZAT-0001FASTVT 05/26/2011 1   . TZAT-0002FASTVT 05/26/2011 N   . TZAT-0003FASTVT 05/26/2011 Burst   . TZAT-0004FASTVT 05/26/2011 8   . TZAT-0005FASTVT 05/26/2011 88   . TZAT-0011FASTVT 05/26/2011 10   . TZAT-0012FASTVT 05/26/2011 200   . TZAT-0013FASTVT 05/26/2011 1   . TZAT-0018FASTVT  05/26/2011 N   . TZAT-0019FASTVT 05/26/2011 8   . TZAT-0020FASTVT 05/26/2011 1.6   . TZST-0001FASTVT 05/26/2011 2   . TZST-0002FASTVT 05/26/2011 N   . TZST-0003FASTVT 05/26/2011 25   . TZST-0004FASTVT 05/26/2011 Biphasic   . TZST-0007FASTVT 05/26/2011 B>AX   . TZST-0001FASTVT 05/26/2011 3   . TZST-0002FASTVT 05/26/2011 N   . TZST-0003FASTVT 05/26/2011 35   . TZST-0004FASTVT 05/26/2011 Biphasic   . TZST-0007FASTVT 05/26/2011 B>AX   . TZST-0001FASTVT 05/26/2011 4   . TZST-0002FASTVT 05/26/2011 N   . TZST-0003FASTVT 05/26/2011 35   . TZST-0004FASTVT 05/26/2011 Biphasic   . TZST-0007FASTVT 05/26/2011 B>AX   . TZST-0001FASTVT 05/26/2011 5   . TZST-0002FASTVT 05/26/2011 N   . TZST-0003FASTVT 05/26/2011 35   . TZST-0004FASTVT 05/26/2011 Biphasic   . TZST-0007FASTVT 05/26/2011 AX>B   . TZST-0001FASTVT 05/26/2011 6   . TZST-0002FASTVT 05/26/2011 N   . TZST-0003FASTVT 05/26/2011 35   . TZST-0004FASTVT 05/26/2011 Biphasic   . TZST-0007FASTVT 05/26/2011 B>AX   . TZON-0002SLOWVT 05/26/2011 Disabled   . TZON-0003SLOWVT 05/26/2011 350   . TZON-0004SLOWVT 05/26/2011 28   . TZON-0005SLOWVT 05/26/2011 24   . TZON-0008SLOWVT 05/26/2011 0 min   . TZON-0010SLOWVT 05/26/2011 Off   . TZAT-0001SLOWVT 05/26/2011 1   . TZAT-0002SLOWVT 05/26/2011 N   . TZAT-0003SLOWVT 05/26/2011 Burst   . TZAT-0004SLOWVT 05/26/2011 6   . TZAT-0005SLOWVT 05/26/2011 84   . TZAT-0011SLOWVT 05/26/2011 10   . TZAT-0012SLOWVT 05/26/2011 200   . TZAT-0013SLOWVT 05/26/2011 3   . TZAT-0018SLOWVT 05/26/2011 N   . TZAT-0019SLOWVT 05/26/2011 8   . TZAT-0020SLOWVT 05/26/2011 1.6   . TZAT-0001SLOWVT 05/26/2011 2   . TZAT-0002SLOWVT 05/26/2011 N   . TZAT-0003SLOWVT 05/26/2011 Ramp   . TZAT-0004SLOWVT 05/26/2011 8   . TZAT-0005SLOWVT 05/26/2011 91   . TZAT-0011SLOWVT 05/26/2011 10   . TZAT-0012SLOWVT 05/26/2011 200   . TZAT-0013SLOWVT 05/26/2011 3   . TZAT-0018SLOWVT 05/26/2011 N   . TZAT-0019SLOWVT 05/26/2011 8   .  TZAT-0020SLOWVT 05/26/2011 1.6   . TZST-0001SLOWVT 05/26/2011 3   . TZST-0002SLOWVT 05/26/2011 N   . TZST-0003SLOWVT 05/26/2011 15   . TZST-0004SLOWVT 05/26/2011 Biphasic   . TZST-0007SLOWVT 05/26/2011 B>AX   . TZST-0001SLOWVT 05/26/2011 4   . TZST-0002SLOWVT 05/26/2011 N   . TZST-0003SLOWVT 05/26/2011 25   . TZST-0004SLOWVT 05/26/2011 Biphasic   . TZST-0007SLOWVT 05/26/2011 B>AX   . TZST-0001SLOWVT 05/26/2011 5   . TZST-0002SLOWVT 05/26/2011  N   . TZST-0003SLOWVT 05/26/2011 35   . TZST-0004SLOWVT 05/26/2011 Biphasic   . TZST-0007SLOWVT 05/26/2011 AX>B   . TZST-0001SLOWVT 05/26/2011 6   . TZST-0002SLOWVT 05/26/2011 N   . TZST-0003SLOWVT 05/26/2011 35   . TZST-0004SLOWVT 05/26/2011 Biphasic   . TZST-0007SLOWVT 05/26/2011 B>AX   . TZON-0002VSLOWVT 05/26/2011 N/A   . TZON-0010VSLOWVT 05/26/2011 Off   . TZON-0002AFIB 05/26/2011 N/A   . TZON-0002AFLUTTER 05/26/2011 N/A   . TZON-0010AFLUTTER 05/26/2011 Off   . TZON-0011AFLUTTER 05/26/2011 70%   . [facility] 05/26/2011 N/A   Office Visit on 05/19/2011  Component Date Value  . Cholesterol 05/19/2011 97   . Triglycerides 05/19/2011 76   . HDL 05/19/2011 32*  . Total CHOL/HDL Ratio 05/19/2011 3.0   . VLDL 05/19/2011 15   . LDL Cholesterol 05/19/2011 50   . Total Bilirubin 05/19/2011 0.6   . Bilirubin, Direct 05/19/2011 0.2   . Indirect Bilirubin 05/19/2011 0.4   . Alkaline Phosphatase 05/19/2011 60   . AST 05/19/2011 24   . ALT 05/19/2011 22   . Total Protein 05/19/2011 6.9   . Albumin 05/19/2011 4.9   . WBC 05/19/2011 5.9   . RBC 05/19/2011 5.18   . Hemoglobin 05/19/2011 16.3   . HCT 05/19/2011 48.4   . MCV 05/19/2011 93.4   . [facility] 05/19/2011 31.5   . MCHC 05/19/2011 33.7   . RDW 05/19/2011 13.0   . Platelets 05/19/2011 170   . Sodium 05/19/2011 141   . Potassium 05/19/2011 4.9   . Chloride 05/19/2011 105   . CO2 05/19/2011 31   . Glucose, Bld 05/19/2011 116*  . BUN 05/19/2011 17   . Creat 05/19/2011 0.92   .  Calcium 05/19/2011 9.7   Clinical Support on 03/30/2011  Component Date Value  . DEVICE MODEL ICD 03/30/2011 [id number]   . DEV-0014ICD 03/30/2011 Lewayne Bunting   M.D.   . Nira Conn 03/30/2011 N   . [id number] 03/30/2011 Lewayne Bunting   M.D.   . EVAL-0005E8 03/30/2011 [employer] [facility]   . EVAL-0022E8 03/30/2011                     Value:ICD remote received. Sensing, impedances, auto capture thresholds consistent with previous device measurements. Histograms appropriate for patient and level of activity. No ventricular arrhythmias recorded. All other diagnostic data reviewed and is                          appropriate and stable for patient. Real time EGM demonstrates appropriate sensing and capture. ROV 06-26-11 @ 930 with GT.  Marland Kitchen CHARGE TIME 03/30/2011 8.22   . RV LEAD IMPEDENCE ICD 03/30/2011 472   . HV IMPEDENCE 03/30/2011 50/65   . BATTERY VOLTAGE 03/30/2011 3.06   . VENTRICULAR PACING ICD 03/30/2011 0   . TOT-0006 03/30/2011 [id number]   . RV LEAD AMPLITUDE 03/30/2011 4   . BRDY-0001RV 03/30/2011 VVI   . BRDY-0002RV 03/30/2011 40   . BRDY-0005RV 03/30/2011 Off/   . BRDY-0009RV 03/30/2011 No   . TACH-0002RV 03/30/2011 N/A   . TACH-0003RV 03/30/2011 Enabled   . TZON-0002FASTVT 03/30/2011 Disabled   . TZON-0003FASTVT 03/30/2011 240   . TZON-0008FASTVT 03/30/2011 0 min   . TZON-0010FASTVT 03/30/2011 Off   . TZAT-0001FASTVT 03/30/2011 1   . TZAT-0002FASTVT 03/30/2011 N   . TZAT-0003FASTVT 03/30/2011 Burst   . TZAT-0004FASTVT 03/30/2011 8   . TZAT-0005FASTVT 03/30/2011 88   . TZAT-0011FASTVT 03/30/2011 10   . TZAT-0012FASTVT 03/30/2011 200   .  TZAT-0013FASTVT 03/30/2011 1   . TZAT-0018FASTVT 03/30/2011 N   . TZAT-0019FASTVT 03/30/2011 8   . TZAT-0020FASTVT 03/30/2011 1.6   . TZST-0001FASTVT 03/30/2011 2   . TZST-0002FASTVT 03/30/2011 N   . TZST-0003FASTVT 03/30/2011 25   . TZST-0004FASTVT 03/30/2011 Biphasic   . TZST-0007FASTVT 03/30/2011 B>AX   .  TZST-0001FASTVT 03/30/2011 3   . TZST-0002FASTVT 03/30/2011 N   . TZST-0003FASTVT 03/30/2011 35   . TZST-0004FASTVT 03/30/2011 Biphasic   . TZST-0007FASTVT 03/30/2011 B>AX   . TZST-0001FASTVT 03/30/2011 4   . TZST-0002FASTVT 03/30/2011 N   . TZST-0003FASTVT 03/30/2011 35   . TZST-0004FASTVT 03/30/2011 Biphasic   . TZST-0007FASTVT 03/30/2011 B>AX   . TZST-0001FASTVT 03/30/2011 5   . TZST-0002FASTVT 03/30/2011 N   . TZST-0003FASTVT 03/30/2011 35   . TZST-0004FASTVT 03/30/2011 Biphasic   . TZST-0007FASTVT 03/30/2011 AX>B   . TZST-0001FASTVT 03/30/2011 6   . TZST-0002FASTVT 03/30/2011 N   . TZST-0003FASTVT 03/30/2011 35   . TZST-0004FASTVT 03/30/2011 Biphasic   . TZST-0007FASTVT 03/30/2011 B>AX   . TZON-0002SLOWVT 03/30/2011 Disabled   . TZON-0003SLOWVT 03/30/2011 350   . TZON-0004SLOWVT 03/30/2011 28   . TZON-0005SLOWVT 03/30/2011 24   . TZON-0008SLOWVT 03/30/2011 0 min   . TZON-0010SLOWVT 03/30/2011 Off   . TZAT-0001SLOWVT 03/30/2011 1   . TZAT-0002SLOWVT 03/30/2011 N   . TZAT-0003SLOWVT 03/30/2011 Burst   . TZAT-0004SLOWVT 03/30/2011 6   . TZAT-0005SLOWVT 03/30/2011 84   . TZAT-0011SLOWVT 03/30/2011 10   . TZAT-0012SLOWVT 03/30/2011 200   . TZAT-0013SLOWVT 03/30/2011 3   . TZAT-0018SLOWVT 03/30/2011 N   . TZAT-0019SLOWVT 03/30/2011 8   . TZAT-0020SLOWVT 03/30/2011 1.6   . TZAT-0001SLOWVT 03/30/2011 2   . TZAT-0002SLOWVT 03/30/2011 N   . TZAT-0003SLOWVT 03/30/2011 Ramp   . TZAT-0004SLOWVT 03/30/2011 8   . TZAT-0005SLOWVT 03/30/2011 91   . TZAT-0011SLOWVT 03/30/2011 10   . TZAT-0012SLOWVT 03/30/2011 200   . TZAT-0013SLOWVT 03/30/2011 3   . TZAT-0018SLOWVT 03/30/2011 N   . TZAT-0019SLOWVT 03/30/2011 8   . TZAT-0020SLOWVT 03/30/2011 1.6   . TZST-0001SLOWVT 03/30/2011 3   . TZST-0002SLOWVT 03/30/2011 N   . TZST-0003SLOWVT 03/30/2011 15   . TZST-0004SLOWVT 03/30/2011 Biphasic   . TZST-0007SLOWVT 03/30/2011 B>AX   . TZST-0001SLOWVT 03/30/2011 4   . TZST-0002SLOWVT  03/30/2011 N   . TZST-0003SLOWVT 03/30/2011 25   . TZST-0004SLOWVT 03/30/2011 Biphasic   . TZST-0007SLOWVT 03/30/2011 B>AX   . TZST-0001SLOWVT 03/30/2011 5   . TZST-0002SLOWVT 03/30/2011 N   . TZST-0003SLOWVT 03/30/2011 35   . TZST-0004SLOWVT 03/30/2011 Biphasic   . TZST-0007SLOWVT 03/30/2011 AX>B   . TZST-0001SLOWVT 03/30/2011 6   . TZST-0002SLOWVT 03/30/2011 N   . TZST-0003SLOWVT 03/30/2011 35   . TZST-0004SLOWVT 03/30/2011 Biphasic   . TZST-0007SLOWVT 03/30/2011 B>AX   . TZON-0002VSLOWVT 03/30/2011 N/A   . TZON-0002AFIB 03/30/2011 N/A   . TZON-0002AFLUTTER 03/30/2011 N/A   . TZON-0010AFLUTTER 03/30/2011 Off   . TZON-0011AFLUTTER 03/30/2011 70%   . [facility] 03/30/2011 N/A      Results for this Opt Visit:     Results for orders placed in visit on 05/26/11  ICD DEVICE OBSERVATION      Component Value Range   DEVICE MODEL ICD [id number]     DEV-0014ICD Lewayne Bunting   M.D.     DEV-0020ICD N     DEV-0014LDO Lewayne Bunting   M.D.     [facility] NOTES ICD       Value: ICD check in clinic. Normal device function. Thresholds and sensing consistent with previous device measurements. Impedance trends stable over time. No  evidence of any ventricular arrhythmias. No mode switches. Histogram distribution appropriate for      patient and level of activity. No changes made this session. Device programmed at appropriate safety margins. Device programmed to optimize intrinsic conduction.  Patient education completed including shock plan. Alert tones/vibration demonstrated for      patient.  ROV 3 months with Dr.Taylor in [place].   CHARGE TIME 8.22     RV LEAD IMPEDENCE ICD 520     HV IMPEDENCE 52/68     BATTERY VOLTAGE 3.06     VF 0     FVT 0     PACEART VT 0     VENTRICULAR PACING ICD 0     RV LEAD AMPLITUDE 6.0     RV LEAD THRESHOLD 1     BRDY-0001RV VVI     BRDY-0002RV 40     BRDY-0005RV Off     BRDY-0009RV No     TACH-0002RV N/A     TACH-0003RV Enabled     TZON-0002FASTVT  Disabled     TZON-0003FASTVT 240     TZON-0008FASTVT 0 min     TZON-0010FASTVT Off     TZAT-0001FASTVT 1     TZAT-0002FASTVT N     TZAT-0003FASTVT Burst     TZAT-0004FASTVT 8     TZAT-0005FASTVT 88     TZAT-0011FASTVT 10     TZAT-0012FASTVT 200     TZAT-0013FASTVT 1     TZAT-0018FASTVT N     TZAT-0019FASTVT 8     TZAT-0020FASTVT 1.6     TZST-0001FASTVT 2     TZST-0002FASTVT N     TZST-0003FASTVT 25     TZST-0004FASTVT Biphasic     TZST-0007FASTVT B>AX     TZST-0001FASTVT 3     TZST-0002FASTVT N     TZST-0003FASTVT 35     TZST-0004FASTVT Biphasic     TZST-0007FASTVT B>AX     TZST-0001FASTVT 4     TZST-0002FASTVT N     TZST-0003FASTVT 35     TZST-0004FASTVT Biphasic     TZST-0007FASTVT B>AX     TZST-0001FASTVT 5     TZST-0002FASTVT N     TZST-0003FASTVT 35     TZST-0004FASTVT Biphasic     TZST-0007FASTVT AX>B     TZST-0001FASTVT 6     TZST-0002FASTVT N     TZST-0003FASTVT 35     TZST-0004FASTVT Biphasic     TZST-0007FASTVT B>AX     TZON-0002SLOWVT Disabled     TZON-0003SLOWVT 350     TZON-0004SLOWVT 28     TZON-0005SLOWVT 24     TZON-0008SLOWVT 0 min     TZON-0010SLOWVT Off     TZAT-0001SLOWVT 1     TZAT-0002SLOWVT N     TZAT-0003SLOWVT Burst     TZAT-0004SLOWVT 6     TZAT-0005SLOWVT 84     TZAT-0011SLOWVT 10     TZAT-0012SLOWVT 200     TZAT-0013SLOWVT 3     TZAT-0018SLOWVT N     TZAT-0019SLOWVT 8     TZAT-0020SLOWVT 1.6     TZAT-0001SLOWVT 2     TZAT-0002SLOWVT N     TZAT-0003SLOWVT Ramp     TZAT-0004SLOWVT 8     TZAT-0005SLOWVT 91     TZAT-0011SLOWVT 10     TZAT-0012SLOWVT 200     TZAT-0013SLOWVT 3     TZAT-0018SLOWVT N     TZAT-0019SLOWVT 8     TZAT-0020SLOWVT 1.6     TZST-0001SLOWVT 3     TZST-0002SLOWVT N     TZST-0003SLOWVT 15  TZST-0004SLOWVT Biphasic     TZST-0007SLOWVT B>AX     TZST-0001SLOWVT 4     TZST-0002SLOWVT N     TZST-0003SLOWVT 25     TZST-0004SLOWVT Biphasic     TZST-0007SLOWVT B>AX     TZST-0001SLOWVT 5      TZST-0002SLOWVT N     TZST-0003SLOWVT 35     TZST-0004SLOWVT Biphasic     TZST-0007SLOWVT AX>B     TZST-0001SLOWVT 6     TZST-0002SLOWVT N     TZST-0003SLOWVT 35     TZST-0004SLOWVT Biphasic     TZST-0007SLOWVT B>AX     TZON-0002VSLOWVT N/A     TZON-0010VSLOWVT Off     TZON-0002AFIB N/A     TZON-0002AFLUTTER N/A     TZON-0010AFLUTTER Off     TZON-0011AFLUTTER 70%     TZON-0002ATACH N/A      EKG Orders placed in visit on 05/19/11  . EKG 12-LEAD     Prior Assessment and Plan Problem List as of 06/13/2011          Cardiology Problems   CARDIOMYOPATHY, ISCHEMIC   Last Assessment & Plan Note   05/19/2011 Office Visit Addendum 05/19/2011  3:17 PM by Jodelle Gross, NP    He seems stable from CV standpoint. I have reviewed his last cardiac cath, echo and  Stress test;.  He states that his breathing status is worsening. I had him walk in the office for 5 minutues for a O2 sat and he was not found to desaturate. Saturation 94% to 96%. He has had a history of COPD but recent PFT's in May of 2012 were negative for this. He has not had any complaints of palpitations or racing heart rate.  I will have his PMK interrogated to make sure his dizziness and DOE is not related to transient ventricular arrhythmias.  He has not had recent labs, CMET, lipids, and CBC will be completed to evaluate kidney fx, for anemia, and ongoing risk stratification. He may need to be placed on low dose diuretic with hx of systolic dysfunction with ongoing DOE.      ATHEROSCLEROTIC CARDIOVASCULAR DISEASE   Last Assessment & Plan Note   06/03/2010 Office Visit Signed 06/03/2010  4:42 PM by Kathlen Brunswick, MD    Mr. Oquinn has had a moderately severe ischemic cardiomyopathy for which an AICD was placed.  LV systolic function will be reevaluated.    Aortic stenosis   Last Assessment & Plan Note   06/03/2010 Office Visit Addendum 06/05/2010 11:33 AM by Kathlen Brunswick, MD    Exam suggests the presence of aortic  stenosis, but bioprosthetic device functioned normally immediately postoperative in 2010.  I would not expect any significant impairment of function to have developed within 2 years.  A repeat echocardiogram will be obtained to exclude this possibility.    Hypertension   Last Assessment & Plan Note   06/23/2010 Office Visit Signed 06/23/2010  9:50 AM by Marinus Maw, MD    His blood pressure is well controlled. He'll continue his current medications. I've asked that he maintain a low-sodium diet.    Hyperlipidemia   Last Assessment & Plan Note   06/03/2010 Office Visit Signed 06/03/2010  4:43 PM by Kathlen Brunswick, MD    Dyslipidemia has not been evaluated for some time; most recent results of fasting lipid profile approximately one year ago were excellent; a repeat study will be obtained.      Other   NEOPLASM, SKIN   CHRONIC  OBSTRUCTIVE PULMONARY DISEASE   Last Assessment & Plan Note   05/19/2011 Office Visit Signed 05/19/2011  3:15 PM by Jodelle Gross, NP    He may have progression of this even though his PFT's did not confirm COPD in May of 2012.  He is advised to take OTC mucinex and Claritin for symptomatic relief of his shortness of breath when he is outside. It may be environmental as the pollen has been especially thick in the air these last few weeks. He is advised to follow-up with a pulmonolgist-Dr.Henderson- in [place], where his wife goes for her lung issues.     AICD (automatic cardioverter/defibrillator) present   Last Assessment & Plan Note   06/23/2010 Office Visit Signed 06/23/2010  9:49 AM by Marinus Maw, MD    His device is working normally. Will recheck in several months.    Carotid bruit   Pancreatitis   Tobacco abuse, in remission   Last Assessment & Plan Note   06/03/2010 Office Visit Signed 06/03/2010  4:46 PM by Kathlen Brunswick, MD    Patient is congratulated on having refrain from smoking for more than 2 years and advised that this will go a long way  towards improving his functional status.    Rectal bleeding   Last Assessment & Plan Note   06/14/2010 Office Visit Signed 06/15/2010  9:25 AM by Tiffany Kocher, PA    Small volume toliet tissue hematochezia recently. No prior colonoscopy. Colonoscopy to be done in the near future. We have notified ENDO that the patient has a defibrillator. He had tissue heart valve replacement in 2009. No indication for SBE prophylaxis.  I have discussed the risks, alternatives, benefits with regards to but not limited to the risk of reaction to medication, bleeding, infection, perforation and the patient is agreeable to proceed. Written consent to be obtained.     Colon cancer screening       Imaging: No results found.   FRS Calculation: Score not calculated. Missing: Total Cholesterol

## 2011-06-13 NOTE — Assessment & Plan Note (Signed)
No recent defibrillator discharges.  Device is managed by Dr. Ladona Ridgel.

## 2011-06-13 NOTE — Patient Instructions (Signed)
Your physician recommends that you schedule a follow-up appointment in: 6 months  Your physician has recommended you make the following change in your medication:  1 - START Lisinopril 2.5 mg tablets =Take 1 tablet by mouth for 2 weeks and increase to 2 tablets daily thereafter, provided your systolic (upper number) blood pressure is at least 100  Your physician recommends that you return for lab work in: 1 month (you will receive a letter)

## 2011-06-13 NOTE — Assessment & Plan Note (Signed)
Lipid profile was quite good when last assessed one month ago.  Current therapy will be continued.

## 2011-06-16 DIAGNOSIS — H40019 Open angle with borderline findings, low risk, unspecified eye: Secondary | ICD-10-CM | POA: Diagnosis not present

## 2011-06-16 DIAGNOSIS — H40059 Ocular hypertension, unspecified eye: Secondary | ICD-10-CM | POA: Diagnosis not present

## 2011-06-16 DIAGNOSIS — H52229 Regular astigmatism, unspecified eye: Secondary | ICD-10-CM | POA: Diagnosis not present

## 2011-06-16 DIAGNOSIS — H5200 Hypermetropia, unspecified eye: Secondary | ICD-10-CM | POA: Diagnosis not present

## 2011-06-26 ENCOUNTER — Encounter: Payer: Medicare Other | Admitting: Internal Medicine

## 2011-06-26 DIAGNOSIS — I10 Essential (primary) hypertension: Secondary | ICD-10-CM | POA: Diagnosis not present

## 2011-06-26 DIAGNOSIS — J449 Chronic obstructive pulmonary disease, unspecified: Secondary | ICD-10-CM | POA: Diagnosis not present

## 2011-06-26 DIAGNOSIS — Z23 Encounter for immunization: Secondary | ICD-10-CM | POA: Diagnosis not present

## 2011-06-26 DIAGNOSIS — J438 Other emphysema: Secondary | ICD-10-CM | POA: Diagnosis not present

## 2011-06-26 DIAGNOSIS — T148XXA Other injury of unspecified body region, initial encounter: Secondary | ICD-10-CM | POA: Diagnosis not present

## 2011-06-26 DIAGNOSIS — Z6829 Body mass index (BMI) 29.0-29.9, adult: Secondary | ICD-10-CM | POA: Diagnosis not present

## 2011-07-11 ENCOUNTER — Other Ambulatory Visit: Payer: Self-pay | Admitting: Cardiology

## 2011-07-11 DIAGNOSIS — I10 Essential (primary) hypertension: Secondary | ICD-10-CM | POA: Diagnosis not present

## 2011-07-11 DIAGNOSIS — R0602 Shortness of breath: Secondary | ICD-10-CM | POA: Diagnosis not present

## 2011-07-12 LAB — BASIC METABOLIC PANEL
CO2: 28 mEq/L (ref 19–32)
Glucose, Bld: 108 mg/dL — ABNORMAL HIGH (ref 70–99)
Potassium: 4.9 mEq/L (ref 3.5–5.3)
Sodium: 140 mEq/L (ref 135–145)

## 2011-09-08 ENCOUNTER — Encounter: Payer: Medicare Other | Admitting: Internal Medicine

## 2011-09-19 ENCOUNTER — Encounter: Payer: Self-pay | Admitting: Internal Medicine

## 2011-09-19 ENCOUNTER — Ambulatory Visit (INDEPENDENT_AMBULATORY_CARE_PROVIDER_SITE_OTHER): Payer: Medicare Other | Admitting: Internal Medicine

## 2011-09-19 VITALS — BP 104/62 | HR 60 | Ht 71.0 in | Wt 203.1 lb

## 2011-09-19 DIAGNOSIS — Z95810 Presence of automatic (implantable) cardiac defibrillator: Secondary | ICD-10-CM | POA: Diagnosis not present

## 2011-09-19 DIAGNOSIS — I10 Essential (primary) hypertension: Secondary | ICD-10-CM | POA: Diagnosis not present

## 2011-09-19 DIAGNOSIS — I2589 Other forms of chronic ischemic heart disease: Secondary | ICD-10-CM

## 2011-09-19 MED ORDER — LISINOPRIL 2.5 MG PO TABS: 5.0000 mg | ORAL_TABLET | Freq: Every day | ORAL | Status: DC

## 2011-09-19 NOTE — Assessment & Plan Note (Signed)
He denies anginal symptoms and he remains active. He will continue his current medical therapy.

## 2011-09-19 NOTE — Assessment & Plan Note (Signed)
His blood pressure is well controlled. He will continue his current medical therapy which includes an ACE inhibitor and a beta blocker. I've encouraged the patient to maintain a low-sodium diet.

## 2011-09-19 NOTE — Assessment & Plan Note (Signed)
His ICD is working normally. We'll plan to recheck in several months. 

## 2011-09-19 NOTE — Patient Instructions (Addendum)
PLEASE SEND IN REMOTE 12/25/11 PER DR. Ladona Ridgel  NO CHANGES WERE MADE TODAY  PLEASE FOLLOW UP WITH DR. Ladona Ridgel IN 1 YR
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
## 2011-09-20 ENCOUNTER — Encounter: Payer: Self-pay | Admitting: Internal Medicine

## 2011-09-20 LAB — ICD DEVICE OBSERVATION
BRDY-0002RV: 40 {beats}/min
CHARGE TIME: 8.28 s
DEV-0020ICD: NEGATIVE
FVT: 0
RV LEAD AMPLITUDE: 6.7 mv
RV LEAD IMPEDENCE ICD: 520 Ohm
RV LEAD THRESHOLD: 1 V
TZAT-0001FASTVT: 1
TZAT-0001SLOWVT: 1
TZAT-0001SLOWVT: 2
TZAT-0002FASTVT: NEGATIVE
TZAT-0004SLOWVT: 6
TZAT-0005FASTVT: 88 pct
TZAT-0012SLOWVT: 200 ms
TZAT-0012SLOWVT: 200 ms
TZAT-0013FASTVT: 1
TZAT-0013SLOWVT: 3
TZAT-0018FASTVT: NEGATIVE
TZAT-0019SLOWVT: 8 V
TZAT-0019SLOWVT: 8 V
TZAT-0020FASTVT: 1.6 ms
TZAT-0020SLOWVT: 1.6 ms
TZAT-0020SLOWVT: 1.6 ms
TZON-0003FASTVT: 240 ms
TZON-0003SLOWVT: 350 ms
TZON-0008SLOWVT: 0 ms
TZST-0001FASTVT: 2
TZST-0001FASTVT: 4
TZST-0001FASTVT: 6
TZST-0001SLOWVT: 3
TZST-0001SLOWVT: 5
TZST-0002FASTVT: NEGATIVE
TZST-0002FASTVT: NEGATIVE
TZST-0002FASTVT: NEGATIVE
TZST-0002SLOWVT: NEGATIVE
TZST-0002SLOWVT: NEGATIVE
TZST-0003FASTVT: 35 J
TZST-0003FASTVT: 35 J
TZST-0003FASTVT: 35 J
TZST-0003SLOWVT: 15 J
TZST-0003SLOWVT: 25 J
TZST-0003SLOWVT: 35 J
VENTRICULAR PACING ICD: 0 pct
VF: 0

## 2011-10-04 DIAGNOSIS — H40059 Ocular hypertension, unspecified eye: Secondary | ICD-10-CM | POA: Diagnosis not present

## 2011-10-27 DIAGNOSIS — J343 Hypertrophy of nasal turbinates: Secondary | ICD-10-CM | POA: Diagnosis not present

## 2011-10-27 DIAGNOSIS — J069 Acute upper respiratory infection, unspecified: Secondary | ICD-10-CM | POA: Diagnosis not present

## 2011-11-08 DIAGNOSIS — L570 Actinic keratosis: Secondary | ICD-10-CM | POA: Diagnosis not present

## 2011-11-08 DIAGNOSIS — L821 Other seborrheic keratosis: Secondary | ICD-10-CM | POA: Diagnosis not present

## 2011-11-08 DIAGNOSIS — Z85828 Personal history of other malignant neoplasm of skin: Secondary | ICD-10-CM | POA: Diagnosis not present

## 2011-11-20 ENCOUNTER — Other Ambulatory Visit: Payer: Self-pay | Admitting: Cardiology

## 2011-11-20 MED ORDER — CARVEDILOL 12.5 MG PO TABS: 12.5000 mg | ORAL_TABLET | Freq: Two times a day (BID) | ORAL | Status: DC

## 2011-11-24 ENCOUNTER — Other Ambulatory Visit: Payer: Self-pay | Admitting: *Deleted

## 2011-11-24 MED ORDER — SIMVASTATIN 40 MG PO TABS: 40.0000 mg | ORAL_TABLET | Freq: Every day | ORAL | Status: DC

## 2011-11-24 NOTE — Telephone Encounter (Signed)
Fax Received. Refill Completed. [name] (R.M.A)   

## 2011-11-24 NOTE — Telephone Encounter (Signed)
Fax Received. Refill Completed. Herchel Hopkin Chowoe (R.M.A)   

## 2011-12-11 ENCOUNTER — Ambulatory Visit: Payer: Medicare Other | Admitting: Cardiology

## 2011-12-25 ENCOUNTER — Ambulatory Visit (INDEPENDENT_AMBULATORY_CARE_PROVIDER_SITE_OTHER): Payer: Medicare Other | Admitting: *Deleted

## 2011-12-25 ENCOUNTER — Encounter: Payer: Self-pay | Admitting: Internal Medicine

## 2011-12-25 DIAGNOSIS — I2589 Other forms of chronic ischemic heart disease: Secondary | ICD-10-CM

## 2011-12-25 DIAGNOSIS — Z95810 Presence of automatic (implantable) cardiac defibrillator: Secondary | ICD-10-CM | POA: Diagnosis not present

## 2011-12-26 ENCOUNTER — Ambulatory Visit (INDEPENDENT_AMBULATORY_CARE_PROVIDER_SITE_OTHER): Payer: Medicare Other | Admitting: Cardiology

## 2011-12-26 ENCOUNTER — Encounter: Payer: Self-pay | Admitting: Cardiology

## 2011-12-26 VITALS — BP 136/76 | HR 53 | Ht 71.0 in | Wt 203.0 lb

## 2011-12-26 DIAGNOSIS — I10 Essential (primary) hypertension: Secondary | ICD-10-CM

## 2011-12-26 DIAGNOSIS — R5383 Other fatigue: Secondary | ICD-10-CM

## 2011-12-26 DIAGNOSIS — R5381 Other malaise: Secondary | ICD-10-CM

## 2011-12-26 DIAGNOSIS — Z95810 Presence of automatic (implantable) cardiac defibrillator: Secondary | ICD-10-CM

## 2011-12-26 DIAGNOSIS — E782 Mixed hyperlipidemia: Secondary | ICD-10-CM | POA: Diagnosis not present

## 2011-12-26 NOTE — Progress Notes (Signed)
Patient ID: [name], male   DOB: 30-May-1945, 66 y.o.   MRN: [id number]  HPI: Scheduled return visit for this very nice gentleman with ischemic cardiomyopathy and prior aortic valve replacement surgery.  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  Prior to Admission medications   Medication Sig Start Date End Date Taking? Authorizing Provider  acetaminophen (TYLENOL) 500 MG tablet Take 500 mg by mouth every 6 (six) hours as needed.     Yes Historical Provider, MD  aspirin 81 MG tablet Take 81 mg by mouth daily.     Yes Historical Provider, MD  carvedilol (COREG) 12.5 MG tablet Take 1 tablet (12.5 mg total) by mouth 2 (two) times daily with a meal. 11/20/11  Yes Kathlen Brunswick, MD  lisinopril (PRINIVIL,ZESTRIL) 2.5 MG tablet Take 2 tablets (5 mg total) by mouth daily. TAKE 2 TABLETS DAILY PROVIDED YOUR SYSTOLIC BLOOD PRESSURE IS AT LEAST 100 09/19/11  Yes Marinus Maw, MD  nitroGLYCERIN (NITROLINGUAL) 0.4 MG/SPRAY spray Place 1 spray under the tongue every 5 (five) minutes as needed.   Yes Historical Provider, MD  simvastatin (ZOCOR) 40 MG tablet Take 1 tablet (40 mg total) by mouth at bedtime. 11/24/11  Yes Kathlen Brunswick, MD   Allergies  Allergen Reactions  . Cefazolin     REACTION: rash  Past medical history, social history, and family history reviewed and updated.  ROS: Denies orthopnea, PND, chest discomfort, or syncope.  He experiences occasional episodes of unsteadiness lasting a matter of seconds.  He has noted no pedal edema.  He rarely experiences dyspnea with exertion in cold weather.  He appreciates no wheezing.  All other systems reviewed and are negative.  PHYSICAL EXAM: BP 136/76  Pulse 53  Ht 5\' 11"  (1.803 m)  Wt 92.08 kg (203 lb)  BMI 28.31 kg/m2  SpO2 97%  General-Well developed; no acute distress Body habitus-Mildly  overweight Neck-No JVD; no carotid bruits; normal carotid upstrokes Lungs-clear lung fields; resonant to percussion Cardiovascular-normal PMI; normal S1 and S2; buzzing grade 2/6 systolic ejection murmur at the cardiac base; preserved aortic component of S2 Abdomen-normal bowel sounds; soft and non-tender without masses or organomegaly Musculoskeletal-No deformities, no cyanosis or clubbing Neurologic-Normal cranial nerves; symmetric strength and tone Skin-Warm, no significant lesions Extremities-distal pulses intact; no edema  ASSESSMENT AND PLAN:   Bing, MD 12/26/2011 12:27 PM

## 2011-12-26 NOTE — Patient Instructions (Addendum)
Your physician recommends that you schedule a follow-up appointment in: 1 year  Your physician recommends that you return for lab work in: Within the week   

## 2011-12-26 NOTE — Progress Notes (Deleted)
Name: [name]    DOB: August 25, 1945  Age: 66 y.o.  MR#: [id number]       PCP:  Colette Ribas, MD      Insurance: @PAYORNAME @   CC:   No chief complaint on file.  MEDICATION BOTTLES NO RECENT LAB WORK  VS BP 136/76  Pulse 53  Ht 5\' 11"  (1.803 m)  Wt 203 lb (92.08 kg)  BMI 28.31 kg/m2  SpO2 97%  Weights Current Weight  12/26/11 203 lb (92.08 kg)  09/19/11 203 lb 1.9 oz (92.135 kg)  06/13/11 200 lb (90.719 kg)    Blood Pressure  BP Readings from Last 3 Encounters:  12/26/11 136/76  09/19/11 104/62  06/13/11 132/75     Admit date:  (Not on file) Last encounter with RMR:  07/11/2011   Allergy Allergies  Allergen Reactions  . Cefazolin     REACTION: rash    Current Outpatient Prescriptions  Medication Sig Dispense Refill  . acetaminophen (TYLENOL) 500 MG tablet Take 500 mg by mouth every 6 (six) hours as needed.        Marland Kitchen aspirin 81 MG tablet Take 81 mg by mouth daily.        . carvedilol (COREG) 12.5 MG tablet Take 1 tablet (12.5 mg total) by mouth 2 (two) times daily with a meal.  60 tablet  6  . lisinopril (PRINIVIL,ZESTRIL) 2.5 MG tablet Take 2 tablets (5 mg total) by mouth daily. TAKE 2 TABLETS DAILY PROVIDED YOUR SYSTOLIC BLOOD PRESSURE IS AT LEAST 100  180 tablet  3  . nitroGLYCERIN (NITROLINGUAL) 0.4 MG/SPRAY spray Place 1 spray under the tongue every 5 (five) minutes as needed.      . simvastatin (ZOCOR) 40 MG tablet Take 1 tablet (40 mg total) by mouth at bedtime.  90 tablet  3    Discontinued Meds:    Medications Discontinued During This Encounter  Medication Reason  . loratadine (CLARITIN) 10 MG tablet Error    Patient Active Problem List  Diagnosis  . NEOPLASM, SKIN  . CARDIOMYOPATHY, ISCHEMIC  . ATHEROSCLEROTIC CARDIOVASCULAR DISEASE  . CHRONIC OBSTRUCTIVE PULMONARY DISEASE  . Aortic stenosis  . Hypertension  . AICD (automatic cardioverter/defibrillator) present  . Carotid bruit  . Pancreatitis  . Hyperlipidemia  . Tobacco abuse, in  remission  . Rectal bleeding    LABS No visits with results within 3 Month(s) from this visit. Latest known visit with results is:  Office Visit on 09/19/2011  Component Date Value  . DEVICE MODEL ICD 09/19/2011 [id number]   . DEV-0014ICD 09/19/2011 Lewayne Bunting   M.D.   . Nira Conn 09/19/2011 N   . Sherlon Handing 09/19/2011 Lewayne Bunting   M.D.   . PACEART [facility] NOTES ICD 09/19/2011                     Value:ICD check in clinic. Normal device function. Thresholds and sensing consistent with previous device measurements. Impedance trends stable over time. No evidence of any ventricular arrhythmias. No mode switches. Histogram distribution appropriate for                          patient and level of activity. No changes made this session. Device programmed at appropriate safety margins. Device programmed to optimize intrinsic conduction. Pt enrolled in remote follow-up. Plan to check device every 3 months remotely and in office  annually. Patient education completed including shock plan. Alert tones/vibration demonstrated for patient.  Carelink 12/25/11.  Marland Kitchen CHARGE TIME 09/19/2011 8.28   . RV LEAD IMPEDENCE ICD 09/19/2011 520   . HV IMPEDENCE 09/19/2011 49/64   . BATTERY VOLTAGE 09/19/2011 3.02   . VF 09/19/2011 0   . FVT 09/19/2011 0   . PACEART VT 09/19/2011 0   . VENTRICULAR PACING ICD 09/19/2011 0   . RV LEAD AMPLITUDE 09/19/2011 6.7   . RV LEAD THRESHOLD 09/19/2011 1   . BRDY-0001RV 09/19/2011 VVI   . BRDY-0002RV 09/19/2011 40   . BRDY-0005RV 09/19/2011 Off   . TACH-0002RV 09/19/2011 N/A   . TACH-0003RV 09/19/2011 Enabled   . TZON-0002FASTVT 09/19/2011 Disabled   . TZON-0003FASTVT 09/19/2011 240   . TZON-0008FASTVT 09/19/2011 0 min   . TZON-0010FASTVT 09/19/2011 Off   . TZAT-0001FASTVT 09/19/2011 1   . TZAT-0002FASTVT 09/19/2011 N   . TZAT-0003FASTVT 09/19/2011 Burst   . TZAT-0004FASTVT 09/19/2011 8   . TZAT-0005FASTVT 09/19/2011 88   .  TZAT-0011FASTVT 09/19/2011 10   . TZAT-0012FASTVT 09/19/2011 200   . TZAT-0013FASTVT 09/19/2011 1   . TZAT-0018FASTVT 09/19/2011 N   . TZAT-0019FASTVT 09/19/2011 8   . TZAT-0020FASTVT 09/19/2011 1.6   . TZST-0001FASTVT 09/19/2011 2   . TZST-0002FASTVT 09/19/2011 N   . TZST-0003FASTVT 09/19/2011 25   . TZST-0004FASTVT 09/19/2011 Biphasic   . TZST-0007FASTVT 09/19/2011 B>AX   . TZST-0001FASTVT 09/19/2011 3   . TZST-0002FASTVT 09/19/2011 N   . TZST-0003FASTVT 09/19/2011 35   . TZST-0004FASTVT 09/19/2011 Biphasic   . TZST-0007FASTVT 09/19/2011 B>AX   . TZST-0001FASTVT 09/19/2011 4   . TZST-0002FASTVT 09/19/2011 N   . TZST-0003FASTVT 09/19/2011 35   . TZST-0004FASTVT 09/19/2011 Biphasic   . TZST-0007FASTVT 09/19/2011 B>AX   . TZST-0001FASTVT 09/19/2011 5   . TZST-0002FASTVT 09/19/2011 N   . TZST-0003FASTVT 09/19/2011 35   . TZST-0004FASTVT 09/19/2011 Biphasic   . TZST-0007FASTVT 09/19/2011 AX>B   . TZST-0001FASTVT 09/19/2011 6   . TZST-0002FASTVT 09/19/2011 N   . TZST-0003FASTVT 09/19/2011 35   . TZST-0004FASTVT 09/19/2011 Biphasic   . TZST-0007FASTVT 09/19/2011 B>AX   . TZON-0002SLOWVT 09/19/2011 Disabled   . TZON-0003SLOWVT 09/19/2011 350   . TZON-0004SLOWVT 09/19/2011 28   . TZON-0005SLOWVT 09/19/2011 24   . TZON-0008SLOWVT 09/19/2011 0 min   . TZON-0010SLOWVT 09/19/2011 Off   . TZAT-0001SLOWVT 09/19/2011 1   . TZAT-0002SLOWVT 09/19/2011 N   . TZAT-0003SLOWVT 09/19/2011 Burst   . TZAT-0004SLOWVT 09/19/2011 6   . TZAT-0005SLOWVT 09/19/2011 84   . TZAT-0011SLOWVT 09/19/2011 10   . TZAT-0012SLOWVT 09/19/2011 200   . TZAT-0013SLOWVT 09/19/2011 3   . TZAT-0018SLOWVT 09/19/2011 N   . TZAT-0019SLOWVT 09/19/2011 8   . TZAT-0020SLOWVT 09/19/2011 1.6   . TZAT-0001SLOWVT 09/19/2011 2   . TZAT-0002SLOWVT 09/19/2011 N   . TZAT-0003SLOWVT 09/19/2011 Ramp   . TZAT-0004SLOWVT 09/19/2011 8   . TZAT-0005SLOWVT 09/19/2011 91   . TZAT-0011SLOWVT 09/19/2011 10   . TZAT-0012SLOWVT  09/19/2011 200   . TZAT-0013SLOWVT 09/19/2011 3   . TZAT-0018SLOWVT 09/19/2011 N   . TZAT-0019SLOWVT 09/19/2011 8   . TZAT-0020SLOWVT 09/19/2011 1.6   . TZST-0001SLOWVT 09/19/2011 3   . TZST-0002SLOWVT 09/19/2011 N   . TZST-0003SLOWVT 09/19/2011 15   . TZST-0004SLOWVT 09/19/2011 Biphasic   . TZST-0007SLOWVT 09/19/2011 B>AX   . TZST-0001SLOWVT 09/19/2011 4   . TZST-0002SLOWVT 09/19/2011 N   . TZST-0003SLOWVT 09/19/2011 25   . TZST-0004SLOWVT 09/19/2011 Biphasic   . TZST-0007SLOWVT 09/19/2011 B>AX   . TZST-0001SLOWVT 09/19/2011 5   . TZST-0002SLOWVT 09/19/2011  N   . TZST-0003SLOWVT 09/19/2011 35   . TZST-0004SLOWVT 09/19/2011 Biphasic   . TZST-0007SLOWVT 09/19/2011 AX>B   . TZST-0001SLOWVT 09/19/2011 6   . TZST-0002SLOWVT 09/19/2011 N   . TZST-0003SLOWVT 09/19/2011 35   . TZST-0004SLOWVT 09/19/2011 Biphasic   . TZST-0007SLOWVT 09/19/2011 B>AX   . TZON-0002VSLOWVT 09/19/2011 N/A   . TZON-0010VSLOWVT 09/19/2011 Off   . TZON-0002AFIB 09/19/2011 N/A   . TZON-0002AFLUTTER 09/19/2011 N/A   . TZON-0010AFLUTTER 09/19/2011 Off   . TZON-0011AFLUTTER 09/19/2011 70%   . [facility] 09/19/2011 N/A      Results for this Opt Visit:     Results for orders placed in visit on 09/19/11  ICD DEVICE OBSERVATION      Component Value Range   DEVICE MODEL ICD [id number] H     DEV-0014ICD Lewayne Bunting   M.D.     DEV-0020ICD N     DEV-0014LDO Lewayne Bunting   M.D.     [facility] NOTES ICD       Value: ICD check in clinic. Normal device function. Thresholds and sensing consistent with previous device measurements. Impedance trends stable over time. No evidence of any ventricular arrhythmias. No mode switches. Histogram distribution appropriate for      patient and level of activity. No changes made this session. Device programmed at appropriate safety margins. Device programmed to optimize intrinsic conduction. Pt enrolled in remote follow-up. Plan to check device every 3 months remotely and in  office      annually. Patient education completed including shock plan. Alert tones/vibration demonstrated for patient.  Carelink 12/25/11.   CHARGE TIME 8.28     RV LEAD IMPEDENCE ICD 520     HV IMPEDENCE 49/64     BATTERY VOLTAGE 3.02     VF 0     FVT 0     PACEART VT 0     VENTRICULAR PACING ICD 0     RV LEAD AMPLITUDE 6.7     RV LEAD THRESHOLD 1     BRDY-0001RV VVI     BRDY-0002RV 40     BRDY-0005RV Off     TACH-0002RV N/A     TACH-0003RV Enabled     TZON-0002FASTVT Disabled     TZON-0003FASTVT 240     TZON-0008FASTVT 0 min     TZON-0010FASTVT Off     TZAT-0001FASTVT 1     TZAT-0002FASTVT N     TZAT-0003FASTVT Burst     TZAT-0004FASTVT 8     TZAT-0005FASTVT 88     TZAT-0011FASTVT 10     TZAT-0012FASTVT 200     TZAT-0013FASTVT 1     TZAT-0018FASTVT N     TZAT-0019FASTVT 8     TZAT-0020FASTVT 1.6     TZST-0001FASTVT 2     TZST-0002FASTVT N     TZST-0003FASTVT 25     TZST-0004FASTVT Biphasic     TZST-0007FASTVT B>AX     TZST-0001FASTVT 3     TZST-0002FASTVT N     TZST-0003FASTVT 35     TZST-0004FASTVT Biphasic     TZST-0007FASTVT B>AX     TZST-0001FASTVT 4     TZST-0002FASTVT N     TZST-0003FASTVT 35     TZST-0004FASTVT Biphasic     TZST-0007FASTVT B>AX     TZST-0001FASTVT 5     TZST-0002FASTVT N     TZST-0003FASTVT 35     TZST-0004FASTVT Biphasic     TZST-0007FASTVT AX>B     TZST-0001FASTVT 6     TZST-0002FASTVT N     TZST-0003FASTVT 35  TZST-0004FASTVT Biphasic     TZST-0007FASTVT B>AX     TZON-0002SLOWVT Disabled     TZON-0003SLOWVT 350     TZON-0004SLOWVT 28     TZON-0005SLOWVT 24     TZON-0008SLOWVT 0 min     TZON-0010SLOWVT Off     TZAT-0001SLOWVT 1     TZAT-0002SLOWVT N     TZAT-0003SLOWVT Burst     TZAT-0004SLOWVT 6     TZAT-0005SLOWVT 84     TZAT-0011SLOWVT 10     TZAT-0012SLOWVT 200     TZAT-0013SLOWVT 3     TZAT-0018SLOWVT N     TZAT-0019SLOWVT 8     TZAT-0020SLOWVT 1.6     TZAT-0001SLOWVT 2     TZAT-0002SLOWVT N      TZAT-0003SLOWVT Ramp     TZAT-0004SLOWVT 8     TZAT-0005SLOWVT 91     TZAT-0011SLOWVT 10     TZAT-0012SLOWVT 200     TZAT-0013SLOWVT 3     TZAT-0018SLOWVT N     TZAT-0019SLOWVT 8     TZAT-0020SLOWVT 1.6     TZST-0001SLOWVT 3     TZST-0002SLOWVT N     TZST-0003SLOWVT 15     TZST-0004SLOWVT Biphasic     TZST-0007SLOWVT B>AX     TZST-0001SLOWVT 4     TZST-0002SLOWVT N     TZST-0003SLOWVT 25     TZST-0004SLOWVT Biphasic     TZST-0007SLOWVT B>AX     TZST-0001SLOWVT 5     TZST-0002SLOWVT N     TZST-0003SLOWVT 35     TZST-0004SLOWVT Biphasic     TZST-0007SLOWVT AX>B     TZST-0001SLOWVT 6     TZST-0002SLOWVT N     TZST-0003SLOWVT 35     TZST-0004SLOWVT Biphasic     TZST-0007SLOWVT B>AX     TZON-0002VSLOWVT N/A     TZON-0010VSLOWVT Off     TZON-0002AFIB N/A     TZON-0002AFLUTTER N/A     TZON-0010AFLUTTER Off     TZON-0011AFLUTTER 70%     TZON-0002ATACH N/A      EKG Orders placed in visit on 09/20/11  . EKG 12-LEAD     Prior Assessment and Plan Problem List as of 12/26/2011            Cardiology Problems   CARDIOMYOPATHY, ISCHEMIC   Last Assessment & Plan Note   09/19/2011 Office Visit Signed 09/19/2011  3:17 PM by Marinus Maw, MD    He denies anginal symptoms and he remains active. He will continue his current medical therapy.    ATHEROSCLEROTIC CARDIOVASCULAR DISEASE   Last Assessment & Plan Note   06/03/2010 Office Visit Signed 06/03/2010  4:42 PM by Kathlen Brunswick, MD    Mr. Argabright has had a moderately severe ischemic cardiomyopathy for which an AICD was placed.  LV systolic function will be reevaluated.    Aortic stenosis   Last Assessment & Plan Note   06/13/2011 Office Visit Signed 06/13/2011  4:22 PM by Kathlen Brunswick, MD    Fairly prominent murmur on exam, but no other physical findings to indicate significant aortic stenosis.  Echocardiogram in 08/2009 found a normally functioning bioprosthetic valve except for the presence of trivial aortic  insufficiency.    Hypertension   Last Assessment & Plan Note   09/19/2011 Office Visit Signed 09/19/2011  3:18 PM by Marinus Maw, MD    His blood pressure is well controlled. He will continue his current medical therapy which includes an ACE inhibitor and a beta blocker. I've encouraged the patient to maintain  a low-sodium diet.    Hyperlipidemia   Last Assessment & Plan Note   06/13/2011 Office Visit Signed 06/13/2011  4:25 PM by Kathlen Brunswick, MD    Lipid profile was quite good when last assessed one month ago.  Current therapy will be continued.      Other   NEOPLASM, SKIN   CHRONIC OBSTRUCTIVE PULMONARY DISEASE   Last Assessment & Plan Note   05/19/2011 Office Visit Signed 05/19/2011  3:15 PM by Jodelle Gross, NP    He may have progression of this even though his PFT's did not confirm COPD in May of 2012.  He is advised to take OTC mucinex and Claritin for symptomatic relief of his shortness of breath when he is outside. It may be environmental as the pollen has been especially thick in the air these last few weeks. He is advised to follow-up with a pulmonolgist-Dr.Henderson- in [place], where his wife goes for her lung issues.     AICD (automatic cardioverter/defibrillator) present   Last Assessment & Plan Note   09/19/2011 Office Visit Signed 09/19/2011  3:17 PM by Marinus Maw, MD    His ICD is working normally. We'll plan to recheck in several months.    Carotid bruit   Pancreatitis   Tobacco abuse, in remission   Last Assessment & Plan Note   06/13/2011 Office Visit Signed 06/13/2011  4:26 PM by Kathlen Brunswick, MD    Patient notes that he is sometimes tempted to smoke a cigarette, but has not done so for a number of years.  He is congratulated on his successful effort to stop tobacco use.    Rectal bleeding   Last Assessment & Plan Note   06/14/2010 Office Visit Signed 06/15/2010  9:25 AM by Tiffany Kocher, PA    Small volume toliet tissue hematochezia recently. No  prior colonoscopy. Colonoscopy to be done in the near future. We have notified ENDO that the patient has a defibrillator. He had tissue heart valve replacement in 2009. No indication for SBE prophylaxis.  I have discussed the risks, alternatives, benefits with regards to but not limited to the risk of reaction to medication, bleeding, infection, perforation and the patient is agreeable to proceed. Written consent to be obtained.         Imaging: No results found.   FRS Calculation: Score not calculated. Missing: Total Cholesterol

## 2011-12-26 NOTE — Assessment & Plan Note (Signed)
Annual in office evaluation performed 08/2011; home monitoring 3 times per year.  No battery depletion in August despite implantation having been performed 5 years ago.

## 2011-12-28 LAB — COMPREHENSIVE METABOLIC PANEL
Alkaline Phosphatase: 44 U/L (ref 39–117)
BUN: 18 mg/dL (ref 6–23)
CO2: 30 mEq/L (ref 19–32)
Creat: 0.93 mg/dL (ref 0.50–1.35)
Glucose, Bld: 106 mg/dL — ABNORMAL HIGH (ref 70–99)
Sodium: 142 mEq/L (ref 135–145)
Total Bilirubin: 0.5 mg/dL (ref 0.3–1.2)

## 2011-12-28 LAB — CBC
MCH: 31.8 pg (ref 26.0–34.0)
MCHC: 35 g/dL (ref 30.0–36.0)
MCV: 90.9 fL (ref 78.0–100.0)
Platelets: 171 10*3/uL (ref 150–400)

## 2011-12-28 LAB — LIPID PANEL
Cholesterol: 89 mg/dL (ref 0–200)
HDL: 36 mg/dL — ABNORMAL LOW (ref >39)
LDL Cholesterol: 39 mg/dL (ref 0–99)
Total CHOL/HDL Ratio: 2.5 Ratio
Triglycerides: 68 mg/dL (ref <150)
VLDL: 14 mg/dL (ref 0–40)

## 2011-12-29 ENCOUNTER — Encounter: Payer: Self-pay | Admitting: Cardiology

## 2012-01-01 LAB — REMOTE ICD DEVICE
CHARGE TIME: 8.28 s
RV LEAD AMPLITUDE: 5.7 mv
RV LEAD IMPEDENCE ICD: 552 Ohm
TOT-0006: 20130827000000
TZAT-0001FASTVT: 1
TZAT-0004FASTVT: 8
TZAT-0005FASTVT: 88 pct
TZAT-0011SLOWVT: 10 ms
TZAT-0011SLOWVT: 10 ms
TZAT-0012FASTVT: 200 ms
TZAT-0012SLOWVT: 200 ms
TZAT-0012SLOWVT: 200 ms
TZAT-0013FASTVT: 1
TZAT-0013SLOWVT: 3
TZAT-0013SLOWVT: 3
TZAT-0018FASTVT: NEGATIVE
TZAT-0019SLOWVT: 8 V
TZAT-0019SLOWVT: 8 V
TZON-0003FASTVT: 240 ms
TZON-0003SLOWVT: 350 ms
TZON-0004SLOWVT: 28
TZON-0005SLOWVT: 24
TZON-0008SLOWVT: 0 ms
TZON-0011AFLUTTER: 70
TZST-0001FASTVT: 2
TZST-0001FASTVT: 5
TZST-0001SLOWVT: 4
TZST-0002FASTVT: NEGATIVE
TZST-0002FASTVT: NEGATIVE
TZST-0002SLOWVT: NEGATIVE
TZST-0002SLOWVT: NEGATIVE
TZST-0003FASTVT: 35 J
TZST-0003FASTVT: 35 J
TZST-0003FASTVT: 35 J
TZST-0003SLOWVT: 15 J
TZST-0003SLOWVT: 25 J
TZST-0003SLOWVT: 35 J

## 2012-01-03 ENCOUNTER — Encounter: Payer: Self-pay | Admitting: *Deleted

## 2012-01-25 DIAGNOSIS — J019 Acute sinusitis, unspecified: Secondary | ICD-10-CM | POA: Diagnosis not present

## 2012-01-25 DIAGNOSIS — R05 Cough: Secondary | ICD-10-CM | POA: Diagnosis not present

## 2012-01-25 DIAGNOSIS — Z6828 Body mass index (BMI) 28.0-28.9, adult: Secondary | ICD-10-CM | POA: Diagnosis not present

## 2012-04-01 ENCOUNTER — Ambulatory Visit (INDEPENDENT_AMBULATORY_CARE_PROVIDER_SITE_OTHER): Payer: Medicare Other | Admitting: *Deleted

## 2012-04-01 DIAGNOSIS — Z95810 Presence of automatic (implantable) cardiac defibrillator: Secondary | ICD-10-CM

## 2012-04-01 DIAGNOSIS — I2589 Other forms of chronic ischemic heart disease: Secondary | ICD-10-CM | POA: Diagnosis not present

## 2012-04-03 ENCOUNTER — Telehealth: Payer: Self-pay | Admitting: Internal Medicine

## 2012-04-03 ENCOUNTER — Encounter: Payer: Self-pay | Admitting: Internal Medicine

## 2012-04-03 ENCOUNTER — Other Ambulatory Visit: Payer: Self-pay | Admitting: Internal Medicine

## 2012-04-03 NOTE — Telephone Encounter (Signed)
Transmission was received. Spoke w/pt's wife.

## 2012-04-03 NOTE — Telephone Encounter (Signed)
New Problem:    Patient called in wanting to let you know that he sent his transmission today.

## 2012-04-14 LAB — REMOTE ICD DEVICE
BRDY-0002RV: 40 {beats}/min
DEV-0020ICD: NEGATIVE
RV LEAD AMPLITUDE: 4.7 mv
TOT-0006: 20131202000000
TZAT-0001FASTVT: 1
TZAT-0001SLOWVT: 1
TZAT-0001SLOWVT: 2
TZAT-0002SLOWVT: NEGATIVE
TZAT-0002SLOWVT: NEGATIVE
TZAT-0004FASTVT: 8
TZAT-0011SLOWVT: 10 ms
TZAT-0011SLOWVT: 10 ms
TZAT-0012FASTVT: 200 ms
TZAT-0013FASTVT: 1
TZAT-0018SLOWVT: NEGATIVE
TZAT-0018SLOWVT: NEGATIVE
TZAT-0019FASTVT: 8 V
TZAT-0019SLOWVT: 8 V
TZAT-0019SLOWVT: 8 V
TZAT-0020FASTVT: 1.6 ms
TZAT-0020SLOWVT: 1.6 ms
TZAT-0020SLOWVT: 1.6 ms
TZON-0005SLOWVT: 24
TZON-0008FASTVT: 0 ms
TZON-0008SLOWVT: 0 ms
TZST-0001FASTVT: 2
TZST-0001FASTVT: 4
TZST-0001FASTVT: 6
TZST-0001SLOWVT: 4
TZST-0001SLOWVT: 5
TZST-0002FASTVT: NEGATIVE
TZST-0002FASTVT: NEGATIVE
TZST-0002FASTVT: NEGATIVE
TZST-0002SLOWVT: NEGATIVE
TZST-0002SLOWVT: NEGATIVE
TZST-0003FASTVT: 25 J
TZST-0003FASTVT: 35 J
TZST-0003FASTVT: 35 J
TZST-0003FASTVT: 35 J
TZST-0003SLOWVT: 35 J
TZST-0003SLOWVT: 35 J
VENTRICULAR PACING ICD: 0 pct

## 2012-04-19 ENCOUNTER — Encounter: Payer: Self-pay | Admitting: *Deleted

## 2012-06-04 ENCOUNTER — Ambulatory Visit (INDEPENDENT_AMBULATORY_CARE_PROVIDER_SITE_OTHER): Payer: Medicare Other | Admitting: Adult Health

## 2012-06-04 ENCOUNTER — Encounter: Payer: Self-pay | Admitting: Adult Health

## 2012-06-04 ENCOUNTER — Encounter: Payer: Self-pay | Admitting: *Deleted

## 2012-06-04 VITALS — BP 125/77 | HR 59 | Ht 71.0 in | Wt 203.1 lb

## 2012-06-04 DIAGNOSIS — I359 Nonrheumatic aortic valve disorder, unspecified: Secondary | ICD-10-CM

## 2012-06-04 DIAGNOSIS — J449 Chronic obstructive pulmonary disease, unspecified: Secondary | ICD-10-CM

## 2012-06-04 DIAGNOSIS — I10 Essential (primary) hypertension: Secondary | ICD-10-CM

## 2012-06-04 DIAGNOSIS — I350 Nonrheumatic aortic (valve) stenosis: Secondary | ICD-10-CM

## 2012-06-04 NOTE — Assessment & Plan Note (Signed)
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 I have given him a letter asking that he not be required to do this as he is unable to complete the task without profound fatigue and shortness of breath.
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
## 2012-06-04 NOTE — Progress Notes (Deleted)
Name: [name]    DOB: 1945/02/21  Age: 67 y.o.  MR#: [id number]       PCP:  Colette Ribas, MD      Insurance: Payor: MEDICARE  Plan: MEDICARE PART A AND B  Product Type: *No Product type*    CC:    Chief Complaint  Patient presents with  . Hypertension  . Cardiomyopathy    Ischemic     VS Filed Vitals:   06/04/12 1330 06/04/12 1332 06/04/12 1335  BP:  122/77 125/77  Pulse: 51 54 59  Height: 5\' 11"  (1.803 m)    Weight: 203 lb 1.9 oz (92.135 kg)    SpO2:  100%     Weights Current Weight  06/04/12 203 lb 1.9 oz (92.135 kg)  12/26/11 203 lb (92.08 kg)  09/19/11 203 lb 1.9 oz (92.135 kg)    Blood Pressure  BP Readings from Last 3 Encounters:  06/04/12 125/77  12/26/11 136/76  09/19/11 104/62     Admit date:  (Not on file) Last encounter with RMR:  Visit date not found   Allergy Cefazolin  Current Outpatient Prescriptions  Medication Sig Dispense Refill  . acetaminophen (TYLENOL) 500 MG tablet Take 500 mg by mouth every 6 (six) hours as needed.        Marland Kitchen aspirin 81 MG tablet Take 81 mg by mouth daily.        . carvedilol (COREG) 12.5 MG tablet Take 1 tablet (12.5 mg total) by mouth 2 (two) times daily with a meal.  60 tablet  6  . lisinopril (PRINIVIL,ZESTRIL) 2.5 MG tablet Take 2 tablets (5 mg total) by mouth daily. TAKE 2 TABLETS DAILY PROVIDED YOUR SYSTOLIC BLOOD PRESSURE IS AT LEAST 100  180 tablet  3  . loratadine (CLARITIN) 10 MG tablet Take 10 mg by mouth as needed for allergies.      . nitroGLYCERIN (NITROLINGUAL) 0.4 MG/SPRAY spray Place 1 spray under the tongue every 5 (five) minutes as needed.      . simvastatin (ZOCOR) 40 MG tablet Take 1 tablet (40 mg total) by mouth at bedtime.  90 tablet  3   No current facility-administered medications for this visit.    Discontinued Meds:   There are no discontinued medications.  Patient Active Problem List   Diagnosis Date Noted  . Rectal bleeding 06/14/2010  . Aortic stenosis   . Hypertension   .  AICD (automatic cardioverter/defibrillator) present   . Carotid bruit   . Pancreatitis   . Hyperlipidemia   . Tobacco abuse, in remission   . NEOPLASM, SKIN 08/27/2009  . ATHEROSCLEROTIC CARDIOVASCULAR DISEASE 02/26/2009  . CHRONIC OBSTRUCTIVE PULMONARY DISEASE 10/28/2008  . CARDIOMYOPATHY, ISCHEMIC 07/13/2008    LABS    Component Value Date/Time   NA 142 12/26/2011 1157   NA 140 07/11/2011 1050   NA 141 05/19/2011 1522   K 4.8 12/26/2011 1157   K 4.9 07/11/2011 1050   K 4.9 05/19/2011 1522   CL 105 12/26/2011 1157   CL 106 07/11/2011 1050   CL 105 05/19/2011 1522   CO2 30 12/26/2011 1157   CO2 28 07/11/2011 1050   CO2 31 05/19/2011 1522   GLUCOSE 106* 12/26/2011 1157   GLUCOSE 108* 07/11/2011 1050   GLUCOSE 116* 05/19/2011 1522   BUN 18 12/26/2011 1157   BUN 15 07/11/2011 1050   BUN 17 05/19/2011 1522   CREATININE 0.93 12/26/2011 1157   CREATININE 0.88 07/11/2011 1050   CREATININE 0.92 05/19/2011  1522   CREATININE 0.98 03/01/2009 2222   CREATININE 0.98 03/01/2009   CREATININE 1.05 10/09/2008 0315   CALCIUM 9.5 12/26/2011 1157   CALCIUM 8.9 07/11/2011 1050   CALCIUM 9.7 05/19/2011 1522   GFRNONAA >60 10/09/2008 0315   GFRNONAA >60 10/09/2008   GFRNONAA >60 10/06/2008 0515   GFRAA  Value: >60        The eGFR has been calculated using the MDRD equation. This calculation has not been validated in all clinical situations. eGFR's persistently <60 mL/min signify possible Chronic Kidney Disease. 10/09/2008 0315   GFRAA  Value: >60        The eGFR has been calculated using the MDRD equation. This calculation has not been validated in all clinical situations. eGFR's persistently <60 mL/min signify possible Chronic Kidney Disease. 10/06/2008 0515   GFRAA  Value: >60        The eGFR has been calculated using the MDRD equation. This calculation has not been validated in all clinical situations. eGFR's persistently <60 mL/min signify possible Chronic Kidney Disease. 10/05/2008 0410   CMP     Component Value  Date/Time   NA 142 12/26/2011 1157   K 4.8 12/26/2011 1157   CL 105 12/26/2011 1157   CO2 30 12/26/2011 1157   GLUCOSE 106* 12/26/2011 1157   BUN 18 12/26/2011 1157   CREATININE 0.93 12/26/2011 1157   CREATININE 0.98 03/01/2009 2222   CALCIUM 9.5 12/26/2011 1157   PROT 6.4 12/26/2011 1157   ALBUMIN 4.1 12/26/2011 1157   AST 22 12/26/2011 1157   ALT 32 12/26/2011 1157   ALKPHOS 44 12/26/2011 1157   BILITOT 0.5 12/26/2011 1157   GFRNONAA >60 10/09/2008 0315   GFRAA  Value: >60        The eGFR has been calculated using the MDRD equation. This calculation has not been validated in all clinical situations. eGFR's persistently <60 mL/min signify possible Chronic Kidney Disease. 10/09/2008 0315       Component Value Date/Time   WBC 6.8 12/26/2011 1157   WBC 5.9 05/19/2011 1522   WBC 6.0 06/06/2010 0955   HGB 15.4 12/26/2011 1157   HGB 16.3 05/19/2011 1522   HGB 15.0 06/06/2010 0955   HCT 44.0 12/26/2011 1157   HCT 48.4 05/19/2011 1522   HCT 45.0 06/06/2010 0955   MCV 90.9 12/26/2011 1157   MCV 93.4 05/19/2011 1522   MCV 93.9 06/06/2010 0955    Lipid Panel     Component Value Date/Time   CHOL 89 12/26/2011 1157   TRIG 68 12/26/2011 1157   HDL 36* 12/26/2011 1157   CHOLHDL 2.5 12/26/2011 1157   VLDL 14 12/26/2011 1157   LDLCALC 39 12/26/2011 1157    ABG    Component Value Date/Time   PHART 7.376 10/02/2008 1834   PCO2ART 42.1 10/02/2008 1834   PO2ART 89.0 10/02/2008 1834   HCO3 24.7* 10/02/2008 1834   TCO2 25 10/03/2008 1659   ACIDBASEDEF 1.0 10/02/2008 1834   O2SAT 97.0 10/02/2008 1834     Lab Results  Component Value Date   TSH 1.373 12/26/2011   BNP (last 3 results) No results found for this basename: PROBNP,  in the last 8760 hours Cardiac Panel (last 3 results) No results found for this basename: CKTOTAL, CKMB, TROPONINI, RELINDX,  in the last 72 hours  Iron/TIBC/Ferritin No results found for this basename: iron, tibc, ferritin     EKG Orders placed in visit on 09/20/11  . EKG 12-LEAD      Prior  Assessment and Plan Problem List as of 06/04/2012     ICD-9-CM     Cardiology Problems   CARDIOMYOPATHY, ISCHEMIC   Last Assessment & Plan   09/19/2011 Office Visit Written 09/19/2011  3:17 PM by Marinus Maw, MD     He denies anginal symptoms and he remains active. He will continue his current medical therapy.    ATHEROSCLEROTIC CARDIOVASCULAR DISEASE   Last Assessment & Plan   06/03/2010 Office Visit Written 06/03/2010  4:42 PM by Kathlen Brunswick, MD     Mr. Reisig has had a moderately severe ischemic cardiomyopathy for which an AICD was placed.  LV systolic function will be reevaluated.    Aortic stenosis   Last Assessment & Plan   06/13/2011 Office Visit Written 06/13/2011  4:22 PM by Kathlen Brunswick, MD     Fairly prominent murmur on exam, but no other physical findings to indicate significant aortic stenosis.  Echocardiogram in 08/2009 found a normally functioning bioprosthetic valve except for the presence of trivial aortic insufficiency.    Hypertension   Last Assessment & Plan   09/19/2011 Office Visit Written 09/19/2011  3:18 PM by Marinus Maw, MD     His blood pressure is well controlled. He will continue his current medical therapy which includes an ACE inhibitor and a beta blocker. I've encouraged the patient to maintain a low-sodium diet.    Hyperlipidemia   Last Assessment & Plan   06/13/2011 Office Visit Written 06/13/2011  4:25 PM by Kathlen Brunswick, MD     Lipid profile was quite good when last assessed one month ago.  Current therapy will be continued.      Other   NEOPLASM, SKIN   CHRONIC OBSTRUCTIVE PULMONARY DISEASE   Last Assessment & Plan   05/19/2011 Office Visit Written 05/19/2011  3:15 PM by Jodelle Gross, NP     He may have progression of this even though his PFT's did not confirm COPD in May of 2012.  He is advised to take OTC mucinex and Claritin for symptomatic relief of his shortness of breath when he is outside. It may be environmental  as the pollen has been especially thick in the air these last few weeks. He is advised to follow-up with a pulmonolgist-Dr.Henderson- in [place], where his wife goes for her lung issues.     AICD (automatic cardioverter/defibrillator) present   Last Assessment & Plan   12/26/2011 Office Visit Written 12/26/2011 12:33 PM by Kathlen Brunswick, MD     Annual in office evaluation performed 08/2011; home monitoring 3 times per year.  No battery depletion in August despite implantation having been performed 5 years ago.    Carotid bruit   Pancreatitis   Tobacco abuse, in remission   Last Assessment & Plan   06/13/2011 Office Visit Written 06/13/2011  4:26 PM by Kathlen Brunswick, MD     Patient notes that he is sometimes tempted to smoke a cigarette, but has not done so for a number of years.  He is congratulated on his successful effort to stop tobacco use.    Rectal bleeding   Last Assessment & Plan   06/14/2010 Office Visit Written 06/15/2010  9:25 AM by Tiffany Kocher, PA     Small volume toliet tissue hematochezia recently. No prior colonoscopy. Colonoscopy to be done in the near future. We have notified ENDO that the patient has a defibrillator. He had tissue heart valve replacement in 2009. No indication  for SBE prophylaxis.  I have discussed the risks, alternatives, benefits with regards to but not limited to the risk of reaction to medication, bleeding, infection, perforation and the patient is agreeable to proceed. Written consent to be obtained.         Imaging: No results found.

## 2012-06-04 NOTE — Patient Instructions (Addendum)
LABS TODAY:  BMET & TSH  Your physician recommends that you schedule a follow-up nurse visit apptointment to check your home b/p monitor.  Your physician recommends that you schedule a follow-up appointment in: 3 months with Joni Reining, NP.

## 2012-06-04 NOTE — Assessment & Plan Note (Signed)
Uncertain of symptoms of dizziness were related to his aortic valve. It appears that he may have just had some overall fatigue, had not eaten well while caring for his wife post pacemaker implantation nor had he slept while well. He has not had any repeat of his symptoms. It is noted that he is bradycardic on Coreg, heart rates in the 50s. This is not new. However if symptoms persist may need to place a cardiac monitor on the patient for evaluation of heart rate especially during times of dizziness. Orthostatics were completed and were found to be negative. Heart rate ranged between 53 and 59 beats per minute.

## 2012-06-04 NOTE — Assessment & Plan Note (Signed)
Blood pressure is currently well-controlled. Can consider decreasing coreg dose if he remains symptomatic from dizziness as well. However with ischemic cardiomyopathy suppression of ventricular arrhythmias is the priority. Despite having AICD pacemaker, it is imperative that his heart rate remained controlled. Lisinopril has been started and stopped on one occasion. This may also be an option should he have symptoms. I will check a BMET for evaluation of dehydration and kidney function

## 2012-06-05 ENCOUNTER — Encounter: Payer: Self-pay | Admitting: *Deleted

## 2012-06-05 ENCOUNTER — Ambulatory Visit (INDEPENDENT_AMBULATORY_CARE_PROVIDER_SITE_OTHER): Payer: Medicare Other | Admitting: *Deleted

## 2012-06-05 VITALS — BP 154/90 | HR 53 | Ht 71.0 in | Wt 203.5 lb

## 2012-06-05 DIAGNOSIS — I10 Essential (primary) hypertension: Secondary | ICD-10-CM | POA: Diagnosis not present

## 2012-06-05 LAB — BASIC METABOLIC PANEL
BUN: 19 mg/dL (ref 6–23)
Chloride: 107 mEq/L (ref 96–112)
Creat: 0.92 mg/dL (ref 0.50–1.35)
Glucose, Bld: 108 mg/dL — ABNORMAL HIGH (ref 70–99)
Potassium: 4.2 mEq/L (ref 3.5–5.3)

## 2012-06-05 NOTE — Progress Notes (Signed)
Pt presented to office for a NV to compare his home BP machine with our BP readings in office  Noted Blood Pressures as accurate in both home monitor and office monitor readings close in range as 154/90 with HR of 53 office monitor and home monitor reading as 158/91 HR 54, pt notes his BP was 110/66 this am at home but his BP always elevates at the doctors office Pt had no complaints today  Cala Bradford A. Janith Nielson L.P.N.  Last OV D/C Summary and VS  125/77 59 5\' 11"  (1.803 m) 203 lb 1.9 oz (92.135 kg) 28.34 kg/m2 100   LABS TODAY: BMET & TSH  Your physician recommends that you schedule a follow-up nurse visit apptointment to check your home b/p monitor.  Your physician recommends that you schedule a follow-up appointment in: 3 months with Joni Reining, NP.

## 2012-06-05 NOTE — Patient Instructions (Addendum)
Your physician recommends that you schedule a follow-up appointment in: to be determined  

## 2012-06-07 ENCOUNTER — Telehealth: Payer: Self-pay | Admitting: Cardiology

## 2012-06-07 NOTE — Telephone Encounter (Signed)
.  Spoke to pt to advise results/instructions. Pt understood. Per discussed results with wife yesterday but pt wanted to confirm instructions,Labs reviewed. Ok except for low blood glucose. May need to have increase his PO intake to avoid hypoglycemia.Decrease lisinopril to 2.5 mg ( he is taking two 2.5 mg tablets).pt understood all

## 2012-06-07 NOTE — Telephone Encounter (Signed)
Patient returning call.

## 2012-06-18 ENCOUNTER — Other Ambulatory Visit: Payer: Self-pay | Admitting: Cardiology

## 2012-06-18 NOTE — Telephone Encounter (Signed)
PT NEEDS THIS TODAY

## 2012-06-18 NOTE — Telephone Encounter (Signed)
rx sent to pharmacy by e-script  

## 2012-07-08 ENCOUNTER — Encounter: Payer: Self-pay | Admitting: Internal Medicine

## 2012-07-08 ENCOUNTER — Ambulatory Visit (INDEPENDENT_AMBULATORY_CARE_PROVIDER_SITE_OTHER): Payer: Medicare Other | Admitting: *Deleted

## 2012-07-08 DIAGNOSIS — Z95810 Presence of automatic (implantable) cardiac defibrillator: Secondary | ICD-10-CM

## 2012-07-08 DIAGNOSIS — I2589 Other forms of chronic ischemic heart disease: Secondary | ICD-10-CM | POA: Diagnosis not present

## 2012-07-14 LAB — REMOTE ICD DEVICE
BRDY-0002RV: 40 {beats}/min
CHARGE TIME: 8.44 s
DEV-0020ICD: NEGATIVE
RV LEAD IMPEDENCE ICD: 496 Ohm
TZAT-0001SLOWVT: 1
TZAT-0001SLOWVT: 2
TZAT-0004FASTVT: 8
TZAT-0005FASTVT: 88 pct
TZAT-0011FASTVT: 10 ms
TZAT-0012FASTVT: 200 ms
TZAT-0012SLOWVT: 200 ms
TZAT-0012SLOWVT: 200 ms
TZAT-0013SLOWVT: 3
TZAT-0013SLOWVT: 3
TZAT-0018SLOWVT: NEGATIVE
TZAT-0018SLOWVT: NEGATIVE
TZAT-0019SLOWVT: 8 V
TZAT-0019SLOWVT: 8 V
TZAT-0020FASTVT: 1.6 ms
TZAT-0020SLOWVT: 1.6 ms
TZAT-0020SLOWVT: 1.6 ms
TZON-0003SLOWVT: 350 ms
TZON-0004SLOWVT: 28
TZON-0008SLOWVT: 0 ms
TZST-0001FASTVT: 5
TZST-0001FASTVT: 6
TZST-0001SLOWVT: 3
TZST-0001SLOWVT: 5
TZST-0002FASTVT: NEGATIVE
TZST-0002FASTVT: NEGATIVE
TZST-0002FASTVT: NEGATIVE
TZST-0002SLOWVT: NEGATIVE
TZST-0003FASTVT: 25 J
TZST-0003FASTVT: 35 J
TZST-0003FASTVT: 35 J
TZST-0003SLOWVT: 25 J
TZST-0003SLOWVT: 35 J

## 2012-08-02 ENCOUNTER — Encounter: Payer: Self-pay | Admitting: *Deleted

## 2012-09-03 ENCOUNTER — Encounter: Payer: Self-pay | Admitting: Internal Medicine

## 2012-09-03 ENCOUNTER — Ambulatory Visit (INDEPENDENT_AMBULATORY_CARE_PROVIDER_SITE_OTHER): Payer: Medicare Other | Admitting: Internal Medicine

## 2012-09-03 VITALS — BP 158/84 | HR 47 | Ht 70.0 in | Wt 200.0 lb

## 2012-09-03 DIAGNOSIS — Z95810 Presence of automatic (implantable) cardiac defibrillator: Secondary | ICD-10-CM | POA: Diagnosis not present

## 2012-09-03 DIAGNOSIS — I2589 Other forms of chronic ischemic heart disease: Secondary | ICD-10-CM | POA: Diagnosis not present

## 2012-09-03 DIAGNOSIS — I10 Essential (primary) hypertension: Secondary | ICD-10-CM

## 2012-09-03 LAB — ICD DEVICE OBSERVATION
DEV-0020ICD: NEGATIVE
FVT: 0
PACEART VT: 0
RV LEAD AMPLITUDE: 5 mv
RV LEAD IMPEDENCE ICD: 456 Ohm
RV LEAD THRESHOLD: 1 V
TZAT-0001FASTVT: 1
TZAT-0012SLOWVT: 200 ms
TZAT-0012SLOWVT: 200 ms
TZAT-0013FASTVT: 1
TZAT-0013SLOWVT: 3
TZAT-0013SLOWVT: 3
TZAT-0018FASTVT: NEGATIVE
TZAT-0018SLOWVT: NEGATIVE
TZAT-0019FASTVT: 8 V
TZAT-0019SLOWVT: 8 V
TZAT-0019SLOWVT: 8 V
TZAT-0020FASTVT: 1.6 ms
TZAT-0020SLOWVT: 1.6 ms
TZAT-0020SLOWVT: 1.6 ms
TZON-0003FASTVT: 240 ms
TZON-0003SLOWVT: 350 ms
TZON-0008SLOWVT: 0 ms
TZST-0001FASTVT: 2
TZST-0001FASTVT: 4
TZST-0001FASTVT: 5
TZST-0001SLOWVT: 5
TZST-0002FASTVT: NEGATIVE
TZST-0002FASTVT: NEGATIVE
TZST-0002SLOWVT: NEGATIVE
TZST-0003FASTVT: 35 J
TZST-0003FASTVT: 35 J
TZST-0003SLOWVT: 25 J
TZST-0003SLOWVT: 35 J

## 2012-09-03 NOTE — Assessment & Plan Note (Signed)
He denies anginal symptoms. No change in medical therapy. 

## 2012-09-03 NOTE — Assessment & Plan Note (Signed)
His medtronic Maximo ICD is working normally and still has battery life present 6 years after implant. Will check in several months.
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
## 2012-09-03 NOTE — Patient Instructions (Addendum)
Your physician wants you to follow-up in: 1  Year with Dr. Ladona Ridgel.  You will receive a reminder letter in the mail two months in advance. If you don't receive a letter, please call our office to schedule the follow-up appointment.  Please send a [facility] transmission on November 17,2014

## 2012-09-03 NOTE — Assessment & Plan Note (Signed)
His blood pressure is elevated today but has been controlled at home. He is encouraged to maintain a low sodium diet.

## 2012-09-06 ENCOUNTER — Encounter: Payer: Self-pay | Admitting: Internal Medicine

## 2012-10-03 DIAGNOSIS — H5200 Hypermetropia, unspecified eye: Secondary | ICD-10-CM | POA: Diagnosis not present

## 2012-10-03 DIAGNOSIS — H40009 Preglaucoma, unspecified, unspecified eye: Secondary | ICD-10-CM | POA: Diagnosis not present

## 2012-10-03 DIAGNOSIS — H2510 Age-related nuclear cataract, unspecified eye: Secondary | ICD-10-CM | POA: Diagnosis not present

## 2012-10-03 DIAGNOSIS — H40019 Open angle with borderline findings, low risk, unspecified eye: Secondary | ICD-10-CM | POA: Diagnosis not present

## 2012-10-04 ENCOUNTER — Ambulatory Visit (INDEPENDENT_AMBULATORY_CARE_PROVIDER_SITE_OTHER): Payer: Medicare Other | Admitting: Adult Health

## 2012-10-04 ENCOUNTER — Encounter: Payer: Self-pay | Admitting: Adult Health

## 2012-10-04 VITALS — BP 140/77 | HR 48 | Ht 71.0 in | Wt 196.0 lb

## 2012-10-04 DIAGNOSIS — I10 Essential (primary) hypertension: Secondary | ICD-10-CM

## 2012-10-04 DIAGNOSIS — I359 Nonrheumatic aortic valve disorder, unspecified: Secondary | ICD-10-CM

## 2012-10-04 DIAGNOSIS — Z95810 Presence of automatic (implantable) cardiac defibrillator: Secondary | ICD-10-CM

## 2012-10-04 DIAGNOSIS — I2589 Other forms of chronic ischemic heart disease: Secondary | ICD-10-CM

## 2012-10-04 DIAGNOSIS — I350 Nonrheumatic aortic (valve) stenosis: Secondary | ICD-10-CM

## 2012-10-04 MED ORDER — NITROGLYCERIN 0.4 MG/SPRAY TL SOLN: 1.0000 | Status: DC | PRN
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
## 2012-10-04 NOTE — Assessment & Plan Note (Signed)
He is without complains of DOE, or chest discomfort. He is to have echocardiogram completed to evaluate LV fx and AoV stenosis. I have filled out DMV forms in the exam room. He will continue current medication regimen.

## 2012-10-04 NOTE — Patient Instructions (Addendum)
Your physician recommends that you schedule a follow-up appointment in: 6 months You will receive a reminder letter two months in advance reminding you to call and schedule your appointment. If you don't receive this letter, please contact our office.  Your physician has requested that you have an echocardiogram. Echocardiography is a painless test that uses sound waves to create images of your heart. It provides your doctor with information about the size and shape of your heart and how well your heart's chambers and valves are working. This procedure takes approximately one hour. There are no restrictions for this procedure.

## 2012-10-04 NOTE — Progress Notes (Deleted)
Name: [name]    DOB: 1945/04/16  Age: 67 y.o.  MR#: [id number]       PCP:  Colette Ribas, MD      Insurance: Payor: MEDICARE / Plan: MEDICARE PART A AND B / Product Type: *No Product type* /   CC:    Chief Complaint  Patient presents with  . Hypertension  . Aortic Stenosis  PT NOTED HE TOOK HIS BP AT HOME BEFORE COMING TO THIS OFFICE AND IT WAS LESS AT HOME 117/70 AND WANTED YOU TO KNOW   VS Filed Vitals:   10/04/12 1327  BP: 140/77  Pulse: 48  Height: 5\' 11"  (1.803 m)  Weight: 196 lb (88.905 kg)    Weights Current Weight  10/04/12 196 lb (88.905 kg)  09/03/12 200 lb (90.719 kg)  06/05/12 203 lb 8 oz (92.307 kg)    Blood Pressure  BP Readings from Last 3 Encounters:  10/04/12 140/77  09/03/12 158/84  06/05/12 154/90     Admit date:  (Not on file) Last encounter with RMR:  06/04/2012   Allergy Cefazolin  Current Outpatient Prescriptions  Medication Sig Dispense Refill  . acetaminophen (TYLENOL) 500 MG tablet Take 500 mg by mouth every 6 (six) hours as needed.        Marland Kitchen aspirin 81 MG tablet Take 81 mg by mouth daily.        . carvedilol (COREG) 12.5 MG tablet TAKE ONE TABLET BY MOUTH TWICE A DAY WITH MEALS  180 tablet  5  . lisinopril (PRINIVIL,ZESTRIL) 2.5 MG tablet Take 2.5 mg by mouth daily. TAKE 1 TABLETS DAILY PROVIDED YOUR SYSTOLIC BLOOD PRESSURE IS AT LEAST 100      . loratadine (CLARITIN) 10 MG tablet Take 10 mg by mouth as needed for allergies.      . nitroGLYCERIN (NITROLINGUAL) 0.4 MG/SPRAY spray Place 1 spray under the tongue every 5 (five) minutes as needed.      . simvastatin (ZOCOR) 40 MG tablet Take 1 tablet (40 mg total) by mouth at bedtime.  90 tablet  3  . amoxicillin (AMOXIL) 500 MG capsule Take 500 mg by mouth 2 (two) times daily. PT TAKES PRN FOR DENTAL WORK       No current facility-administered medications for this visit.    Discontinued Meds:   There are no discontinued medications.  Patient Active Problem List   Diagnosis Date  Noted  . Rectal bleeding 06/14/2010  . Aortic stenosis   . Hypertension   . AICD (automatic cardioverter/defibrillator) present   . Carotid bruit   . Pancreatitis   . Hyperlipidemia   . Tobacco abuse, in remission   . NEOPLASM, SKIN 08/27/2009  . ATHEROSCLEROTIC CARDIOVASCULAR DISEASE 02/26/2009  . CHRONIC OBSTRUCTIVE PULMONARY DISEASE 10/28/2008  . CARDIOMYOPATHY, ISCHEMIC 07/13/2008    LABS    Component Value Date/Time   NA 141 06/04/2012 1426   NA 142 12/26/2011 1157   NA 140 07/11/2011 1050   K 4.2 06/04/2012 1426   K 4.8 12/26/2011 1157   K 4.9 07/11/2011 1050   CL 107 06/04/2012 1426   CL 105 12/26/2011 1157   CL 106 07/11/2011 1050   CO2 27 06/04/2012 1426   CO2 30 12/26/2011 1157   CO2 28 07/11/2011 1050   GLUCOSE 108* 06/04/2012 1426   GLUCOSE 106* 12/26/2011 1157   GLUCOSE 108* 07/11/2011 1050   BUN 19 06/04/2012 1426   BUN 18 12/26/2011 1157   BUN 15 07/11/2011 1050   CREATININE  0.92 06/04/2012 1426   CREATININE 0.93 12/26/2011 1157   CREATININE 0.88 07/11/2011 1050   CREATININE 0.98 03/01/2009 2222   CREATININE 0.98 03/01/2009   CREATININE 1.05 10/09/2008 0315   CALCIUM 9.0 06/04/2012 1426   CALCIUM 9.5 12/26/2011 1157   CALCIUM 8.9 07/11/2011 1050   GFRNONAA >60 10/09/2008 0315   GFRNONAA >60 10/09/2008   GFRNONAA >60 10/06/2008 0515   GFRAA  Value: >60        The eGFR has been calculated using the MDRD equation. This calculation has not been validated in all clinical situations. eGFR's persistently <60 mL/min signify possible Chronic Kidney Disease. 10/09/2008 0315   GFRAA  Value: >60        The eGFR has been calculated using the MDRD equation. This calculation has not been validated in all clinical situations. eGFR's persistently <60 mL/min signify possible Chronic Kidney Disease. 10/06/2008 0515   GFRAA  Value: >60        The eGFR has been calculated using the MDRD equation. This calculation has not been validated in all clinical situations. eGFR's persistently <60 mL/min signify  possible Chronic Kidney Disease. 10/05/2008 0410   CMP     Component Value Date/Time   NA 141 06/04/2012 1426   K 4.2 06/04/2012 1426   CL 107 06/04/2012 1426   CO2 27 06/04/2012 1426   GLUCOSE 108* 06/04/2012 1426   BUN 19 06/04/2012 1426   CREATININE 0.92 06/04/2012 1426   CREATININE 0.98 03/01/2009 2222   CALCIUM 9.0 06/04/2012 1426   PROT 6.4 12/26/2011 1157   ALBUMIN 4.1 12/26/2011 1157   AST 22 12/26/2011 1157   ALT 32 12/26/2011 1157   ALKPHOS 44 12/26/2011 1157   BILITOT 0.5 12/26/2011 1157   GFRNONAA >60 10/09/2008 0315   GFRAA  Value: >60        The eGFR has been calculated using the MDRD equation. This calculation has not been validated in all clinical situations. eGFR's persistently <60 mL/min signify possible Chronic Kidney Disease. 10/09/2008 0315       Component Value Date/Time   WBC 6.8 12/26/2011 1157   WBC 5.9 05/19/2011 1522   WBC 6.0 06/06/2010 0955   HGB 15.4 12/26/2011 1157   HGB 16.3 05/19/2011 1522   HGB 15.0 06/06/2010 0955   HCT 44.0 12/26/2011 1157   HCT 48.4 05/19/2011 1522   HCT 45.0 06/06/2010 0955   MCV 90.9 12/26/2011 1157   MCV 93.4 05/19/2011 1522   MCV 93.9 06/06/2010 0955    Lipid Panel     Component Value Date/Time   CHOL 89 12/26/2011 1157   TRIG 68 12/26/2011 1157   HDL 36* 12/26/2011 1157   CHOLHDL 2.5 12/26/2011 1157   VLDL 14 12/26/2011 1157   LDLCALC 39 12/26/2011 1157    ABG    Component Value Date/Time   PHART 7.376 10/02/2008 1834   PCO2ART 42.1 10/02/2008 1834   PO2ART 89.0 10/02/2008 1834   HCO3 24.7* 10/02/2008 1834   TCO2 25 10/03/2008 1659   ACIDBASEDEF 1.0 10/02/2008 1834   O2SAT 97.0 10/02/2008 1834     Lab Results  Component Value Date   TSH 1.836 06/04/2012   BNP (last 3 results) No results found for this basename: PROBNP,  in the last 8760 hours Cardiac Panel (last 3 results) No results found for this basename: CKTOTAL, CKMB, TROPONINI, RELINDX,  in the last 72 hours  Iron/TIBC/Ferritin No results found for this basename: iron, tibc,  ferritin     EKG Orders  placed in visit on 09/06/12  . EKG 12-LEAD     Prior Assessment and Plan Problem List as of 10/04/2012   NEOPLASM, SKIN   CARDIOMYOPATHY, ISCHEMIC   Last Assessment & Plan   09/03/2012 Office Visit Written 09/03/2012  9:40 AM by Marinus Maw, MD     He denies anginal symptoms. No change in medical therapy.    ATHEROSCLEROTIC CARDIOVASCULAR DISEASE   Last Assessment & Plan   06/03/2010 Office Visit Written 06/03/2010  4:42 PM by Kathlen Brunswick, MD     Mr. Ellers has had a moderately severe ischemic cardiomyopathy for which an AICD was placed.  LV systolic function will be reevaluated.    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 I have given him a letter asking that he not be required to do this as he is unable to complete the task without profound fatigue and shortness of breath.    Aortic stenosis   Last Assessment & Plan   06/04/2012 Office Visit Written 06/04/2012  2:44 PM by Jodelle Gross, NP     Uncertain of symptoms of dizziness were related to his aortic valve. It appears that he may have just had some overall fatigue, had not eaten well while caring for his wife post pacemaker implantation nor had he slept while well. He has not had any repeat of his symptoms. It is noted that he is bradycardic on Coreg, heart rates in the 50s. This is not new. However if symptoms persist may need to place a cardiac monitor on the patient for evaluation of heart rate especially during times of dizziness. Orthostatics were completed and were found to be negative. Heart rate ranged between 53 and 59 beats per minute.    Hypertension   Last Assessment & Plan   09/03/2012 Office Visit Written 09/03/2012  9:40 AM by Marinus Maw, MD     His blood pressure is  elevated today but has been controlled at home. He is encouraged to maintain a low sodium diet.    AICD (automatic cardioverter/defibrillator) present   Last Assessment & Plan   09/03/2012 Office Visit Written 09/03/2012  9:39 AM by Marinus Maw, MD     His medtronic Maximo ICD is working normally and still has battery life present 6 years after implant. Will check in several months.    Carotid bruit   Pancreatitis   Hyperlipidemia   Last Assessment & Plan   06/13/2011 Office Visit Written 06/13/2011  4:25 PM by Kathlen Brunswick, MD     Lipid profile was quite good when last assessed one month ago.  Current therapy will be continued.    Tobacco abuse, in remission   Last Assessment & Plan   06/13/2011 Office Visit Written 06/13/2011  4:26 PM by Kathlen Brunswick, MD     Patient notes that he is sometimes tempted to smoke a cigarette, but has not done so for a number of years.  He is congratulated on his successful effort to stop tobacco use.    Rectal bleeding   Last Assessment & Plan   06/14/2010 Office Visit Written 06/15/2010  9:25 AM by Tiffany Kocher, PA     Small volume toliet tissue hematochezia recently. No prior colonoscopy. Colonoscopy to  be done in the near future. We have notified ENDO that the patient has a defibrillator. He had tissue heart valve replacement in 2009. No indication for SBE prophylaxis.  I have discussed the risks, alternatives, benefits with regards to but not limited to the risk of reaction to medication, bleeding, infection, perforation and the patient is agreeable to proceed. Written consent to be obtained.         Imaging: No results found.

## 2012-10-04 NOTE — Progress Notes (Deleted)
Name: [name]    DOB: 06-Feb-1945  Age: 67 y.o.  MR#: [id number]       PCP:  Colette Ribas, MD      Insurance: Payor: MEDICARE / Plan: MEDICARE PART A AND B / Product Type: *No Product type* /   CC:    Chief Complaint  Patient presents with  . Hypertension  . Aortic Stenosis    VS Filed Vitals:   10/04/12 1327  BP: 140/77  Pulse: 48  Height: 5\' 11"  (1.803 m)  Weight: 196 lb (88.905 kg)    Weights Current Weight  10/04/12 196 lb (88.905 kg)  09/03/12 200 lb (90.719 kg)  06/05/12 203 lb 8 oz (92.307 kg)    Blood Pressure  BP Readings from Last 3 Encounters:  10/04/12 140/77  09/03/12 158/84  06/05/12 154/90     Admit date:  (Not on file) Last encounter with RMR:  06/04/2012   Allergy Cefazolin  Current Outpatient Prescriptions  Medication Sig Dispense Refill  . acetaminophen (TYLENOL) 500 MG tablet Take 500 mg by mouth every 6 (six) hours as needed.        Marland Kitchen aspirin 81 MG tablet Take 81 mg by mouth daily.        . carvedilol (COREG) 12.5 MG tablet TAKE ONE TABLET BY MOUTH TWICE A DAY WITH MEALS  180 tablet  5  . lisinopril (PRINIVIL,ZESTRIL) 2.5 MG tablet Take 2.5 mg by mouth daily. TAKE 1 TABLETS DAILY PROVIDED YOUR SYSTOLIC BLOOD PRESSURE IS AT LEAST 100      . loratadine (CLARITIN) 10 MG tablet Take 10 mg by mouth as needed for allergies.      . nitroGLYCERIN (NITROLINGUAL) 0.4 MG/SPRAY spray Place 1 spray under the tongue every 5 (five) minutes as needed.      . simvastatin (ZOCOR) 40 MG tablet Take 1 tablet (40 mg total) by mouth at bedtime.  90 tablet  3  . amoxicillin (AMOXIL) 500 MG capsule Take 500 mg by mouth 2 (two) times daily. PT TAKES PRN FOR DENTAL WORK       No current facility-administered medications for this visit.    Discontinued Meds:   There are no discontinued medications.  Patient Active Problem List   Diagnosis Date Noted  . Rectal bleeding 06/14/2010  . Aortic stenosis   . Hypertension   . AICD (automatic  cardioverter/defibrillator) present   . Carotid bruit   . Pancreatitis   . Hyperlipidemia   . Tobacco abuse, in remission   . NEOPLASM, SKIN 08/27/2009  . ATHEROSCLEROTIC CARDIOVASCULAR DISEASE 02/26/2009  . CHRONIC OBSTRUCTIVE PULMONARY DISEASE 10/28/2008  . CARDIOMYOPATHY, ISCHEMIC 07/13/2008    LABS    Component Value Date/Time   NA 141 06/04/2012 1426   NA 142 12/26/2011 1157   NA 140 07/11/2011 1050   K 4.2 06/04/2012 1426   K 4.8 12/26/2011 1157   K 4.9 07/11/2011 1050   CL 107 06/04/2012 1426   CL 105 12/26/2011 1157   CL 106 07/11/2011 1050   CO2 27 06/04/2012 1426   CO2 30 12/26/2011 1157   CO2 28 07/11/2011 1050   GLUCOSE 108* 06/04/2012 1426   GLUCOSE 106* 12/26/2011 1157   GLUCOSE 108* 07/11/2011 1050   BUN 19 06/04/2012 1426   BUN 18 12/26/2011 1157   BUN 15 07/11/2011 1050   CREATININE 0.92 06/04/2012 1426   CREATININE 0.93 12/26/2011 1157   CREATININE 0.88 07/11/2011 1050   CREATININE 0.98 03/01/2009 2222   CREATININE 0.98  03/01/2009   CREATININE 1.05 10/09/2008 0315   CALCIUM 9.0 06/04/2012 1426   CALCIUM 9.5 12/26/2011 1157   CALCIUM 8.9 07/11/2011 1050   GFRNONAA >60 10/09/2008 0315   GFRNONAA >60 10/09/2008   GFRNONAA >60 10/06/2008 0515   GFRAA  Value: >60        The eGFR has been calculated using the MDRD equation. This calculation has not been validated in all clinical situations. eGFR's persistently <60 mL/min signify possible Chronic Kidney Disease. 10/09/2008 0315   GFRAA  Value: >60        The eGFR has been calculated using the MDRD equation. This calculation has not been validated in all clinical situations. eGFR's persistently <60 mL/min signify possible Chronic Kidney Disease. 10/06/2008 0515   GFRAA  Value: >60        The eGFR has been calculated using the MDRD equation. This calculation has not been validated in all clinical situations. eGFR's persistently <60 mL/min signify possible Chronic Kidney Disease. 10/05/2008 0410   CMP     Component Value Date/Time   NA 141  06/04/2012 1426   K 4.2 06/04/2012 1426   CL 107 06/04/2012 1426   CO2 27 06/04/2012 1426   GLUCOSE 108* 06/04/2012 1426   BUN 19 06/04/2012 1426   CREATININE 0.92 06/04/2012 1426   CREATININE 0.98 03/01/2009 2222   CALCIUM 9.0 06/04/2012 1426   PROT 6.4 12/26/2011 1157   ALBUMIN 4.1 12/26/2011 1157   AST 22 12/26/2011 1157   ALT 32 12/26/2011 1157   ALKPHOS 44 12/26/2011 1157   BILITOT 0.5 12/26/2011 1157   GFRNONAA >60 10/09/2008 0315   GFRAA  Value: >60        The eGFR has been calculated using the MDRD equation. This calculation has not been validated in all clinical situations. eGFR's persistently <60 mL/min signify possible Chronic Kidney Disease. 10/09/2008 0315       Component Value Date/Time   WBC 6.8 12/26/2011 1157   WBC 5.9 05/19/2011 1522   WBC 6.0 06/06/2010 0955   HGB 15.4 12/26/2011 1157   HGB 16.3 05/19/2011 1522   HGB 15.0 06/06/2010 0955   HCT 44.0 12/26/2011 1157   HCT 48.4 05/19/2011 1522   HCT 45.0 06/06/2010 0955   MCV 90.9 12/26/2011 1157   MCV 93.4 05/19/2011 1522   MCV 93.9 06/06/2010 0955    Lipid Panel     Component Value Date/Time   CHOL 89 12/26/2011 1157   TRIG 68 12/26/2011 1157   HDL 36* 12/26/2011 1157   CHOLHDL 2.5 12/26/2011 1157   VLDL 14 12/26/2011 1157   LDLCALC 39 12/26/2011 1157    ABG    Component Value Date/Time   PHART 7.376 10/02/2008 1834   PCO2ART 42.1 10/02/2008 1834   PO2ART 89.0 10/02/2008 1834   HCO3 24.7* 10/02/2008 1834   TCO2 25 10/03/2008 1659   ACIDBASEDEF 1.0 10/02/2008 1834   O2SAT 97.0 10/02/2008 1834     Lab Results  Component Value Date   TSH 1.836 06/04/2012   BNP (last 3 results) No results found for this basename: PROBNP,  in the last 8760 hours Cardiac Panel (last 3 results) No results found for this basename: CKTOTAL, CKMB, TROPONINI, RELINDX,  in the last 72 hours  Iron/TIBC/Ferritin No results found for this basename: iron, tibc, ferritin     EKG Orders placed in visit on 09/06/12  . EKG 12-LEAD     Prior Assessment and  Plan Problem List as of 10/04/2012  Cardiovascular and Mediastinum   CARDIOMYOPATHY, ISCHEMIC   Last Assessment & Plan   09/03/2012 Office Visit Written 09/03/2012  9:40 AM by Marinus Maw, MD     He denies anginal symptoms. No change in medical therapy.    ATHEROSCLEROTIC CARDIOVASCULAR DISEASE   Last Assessment & Plan   06/03/2010 Office Visit Written 06/03/2010  4:42 PM by Kathlen Brunswick, MD     Mr. Fauth has had a moderately severe ischemic cardiomyopathy for which an AICD was placed.  LV systolic function will be reevaluated.    Aortic stenosis   Last Assessment & Plan   06/04/2012 Office Visit Written 06/04/2012  2:44 PM by Jodelle Gross, NP     Uncertain of symptoms of dizziness were related to his aortic valve. It appears that he may have just had some overall fatigue, had not eaten well while caring for his wife post pacemaker implantation nor had he slept while well. He has not had any repeat of his symptoms. It is noted that he is bradycardic on Coreg, heart rates in the 50s. This is not new. However if symptoms persist may need to place a cardiac monitor on the patient for evaluation of heart rate especially during times of dizziness. Orthostatics were completed and were found to be negative. Heart rate ranged between 53 and 59 beats per minute.    Hypertension   Last Assessment & Plan   09/03/2012 Office Visit Written 09/03/2012  9:40 AM by Marinus Maw, MD     His blood pressure is elevated today but has been controlled at home. He is encouraged to maintain a low sodium diet.      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 I have given him a letter asking that he not be required to do this as he is unable to complete the task without profound  fatigue and shortness of breath.      Digestive   Rectal bleeding   Last Assessment & Plan   06/14/2010 Office Visit Written 06/15/2010  9:25 AM by Tiffany Kocher, PA     Small volume toliet tissue hematochezia recently. No prior colonoscopy. Colonoscopy to be done in the near future. We have notified ENDO that the patient has a defibrillator. He had tissue heart valve replacement in 2009. No indication for SBE prophylaxis.  I have discussed the risks, alternatives, benefits with regards to but not limited to the risk of reaction to medication, bleeding, infection, perforation and the patient is agreeable to proceed. Written consent to be obtained.       Other   NEOPLASM, SKIN   AICD (automatic cardioverter/defibrillator) present   Last Assessment & Plan   09/03/2012 Office Visit Written 09/03/2012  9:39 AM by Marinus Maw, MD     His medtronic Maximo ICD is working normally and still has battery life present 6 years after implant. Will check in several months.    Carotid bruit   Pancreatitis   Hyperlipidemia   Last Assessment & Plan   06/13/2011 Office Visit Written 06/13/2011  4:25 PM by Kathlen Brunswick, MD     Lipid profile was quite good when last assessed one month ago.  Current therapy will be continued.  Tobacco abuse, in remission   Last Assessment & Plan   06/13/2011 Office Visit Written 06/13/2011  4:26 PM by Kathlen Brunswick, MD     Patient notes that he is sometimes tempted to smoke a cigarette, but has not done so for a number of years.  He is congratulated on his successful effort to stop tobacco use.        Imaging: No results found.

## 2012-10-04 NOTE — Assessment & Plan Note (Signed)
Continue to follow Dr.Taylor for ongoing interrogations. No discharges are reported.

## 2012-10-04 NOTE — Assessment & Plan Note (Signed)
Follow up echo is planned for continued evaluation of AoV stenosis. Significant systolic murmur, high-pitched is auscultated on exam.

## 2012-10-04 NOTE — Assessment & Plan Note (Signed)
Good control of BP. No changes in medications.

## 2012-10-09 DIAGNOSIS — Z23 Encounter for immunization: Secondary | ICD-10-CM | POA: Diagnosis not present

## 2012-10-09 DIAGNOSIS — I10 Essential (primary) hypertension: Secondary | ICD-10-CM | POA: Diagnosis not present

## 2012-10-09 DIAGNOSIS — I2510 Atherosclerotic heart disease of native coronary artery without angina pectoris: Secondary | ICD-10-CM | POA: Diagnosis not present

## 2012-10-09 DIAGNOSIS — Z6827 Body mass index (BMI) 27.0-27.9, adult: Secondary | ICD-10-CM | POA: Diagnosis not present

## 2012-10-11 ENCOUNTER — Ambulatory Visit (HOSPITAL_COMMUNITY): Payer: Medicare Other | Attending: Adult Health

## 2012-10-17 ENCOUNTER — Telehealth: Payer: Self-pay | Admitting: *Deleted

## 2012-10-17 MED ORDER — NITROGLYCERIN 0.4 MG SL SUBL: 0.4000 mg | SUBLINGUAL_TABLET | SUBLINGUAL | Status: DC | PRN

## 2012-10-17 NOTE — Telephone Encounter (Signed)
PT states that the nitro spray is going to cost him $95 after insurance coverage and he can not afford that. He states that it is a bigger bottle then he normally gets and doesn't know if that was the reason. Pt also states that if he has to (doesnt want to) he will switch back to the pills.

## 2012-10-17 NOTE — Telephone Encounter (Signed)
This nurse call [facility] pharmacy to see if there was any nitro spray cheaper. Had to change pt back to nitro pill because it's cheaper for pt. Called pt to let him know. Pt understands.

## 2012-10-22 ENCOUNTER — Ambulatory Visit (HOSPITAL_COMMUNITY)
Admission: RE | Admit: 2012-10-22 | Discharge: 2012-10-22 | Disposition: A | Discharge status: Home / Self Care | Payer: Medicare Other | Source: Ambulatory Visit | Attending: Cardiology | Admitting: Cardiology

## 2012-10-22 DIAGNOSIS — I359 Nonrheumatic aortic valve disorder, unspecified: Secondary | ICD-10-CM | POA: Diagnosis not present

## 2012-10-22 DIAGNOSIS — J4489 Other specified chronic obstructive pulmonary disease: Secondary | ICD-10-CM | POA: Insufficient documentation

## 2012-10-22 DIAGNOSIS — I2510 Atherosclerotic heart disease of native coronary artery without angina pectoris: Secondary | ICD-10-CM | POA: Insufficient documentation

## 2012-10-22 DIAGNOSIS — I350 Nonrheumatic aortic (valve) stenosis: Secondary | ICD-10-CM

## 2012-10-22 DIAGNOSIS — J449 Chronic obstructive pulmonary disease, unspecified: Secondary | ICD-10-CM | POA: Diagnosis not present

## 2012-10-22 DIAGNOSIS — I517 Cardiomegaly: Secondary | ICD-10-CM | POA: Diagnosis not present

## 2012-10-22 NOTE — Progress Notes (Signed)
*  PRELIMINARY RESULTS* Echocardiogram 2D Echocardiogram has been performed.  Leona Pressly 10/22/2012, 1:26 PM

## 2012-11-01 DIAGNOSIS — H40009 Preglaucoma, unspecified, unspecified eye: Secondary | ICD-10-CM | POA: Diagnosis not present

## 2012-11-11 ENCOUNTER — Telehealth: Payer: Self-pay

## 2012-11-11 DIAGNOSIS — L821 Other seborrheic keratosis: Secondary | ICD-10-CM | POA: Diagnosis not present

## 2012-11-11 DIAGNOSIS — L570 Actinic keratosis: Secondary | ICD-10-CM | POA: Diagnosis not present

## 2012-11-11 DIAGNOSIS — I10 Essential (primary) hypertension: Secondary | ICD-10-CM

## 2012-11-11 DIAGNOSIS — Z85828 Personal history of other malignant neoplasm of skin: Secondary | ICD-10-CM | POA: Diagnosis not present

## 2012-11-11 MED ORDER — LISINOPRIL 2.5 MG PO TABS: 2.5000 mg | ORAL_TABLET | Freq: Every day | ORAL | Status: DC

## 2012-11-11 MED ORDER — CARVEDILOL 12.5 MG PO TABS: ORAL_TABLET | ORAL | Status: DC

## 2012-11-11 MED ORDER — SIMVASTATIN 40 MG PO TABS: 40.0000 mg | ORAL_TABLET | Freq: Every day | ORAL | Status: DC

## 2012-11-11 NOTE — Telephone Encounter (Signed)
Received fax refill request  Rx # transferred from [facility] Medication:  Carvedilol 12.5 mg tab Qty 180 Sig:  Take one tab twice daily with meals Physician:  Dietrich Pates

## 2012-11-11 NOTE — Telephone Encounter (Signed)
Received fax refill request  Rx # [id number] transferred from [facility] Medication:  Simvastatin 40 mg Qty 90 Sig:  Take one tablet daily at bedtime Physician:  Dietrich Pates

## 2012-11-11 NOTE — Telephone Encounter (Signed)
Medication sent via escribe.  

## 2012-11-11 NOTE — Telephone Encounter (Signed)
Received fax refill request  Rx # [id number] Medication:  Lisinopril 2.5 mg tab #180 Qty 180 Sig:  Take two tab once daily provided your systolic blood pressure is at least 100 Physician:  Ladona Ridgel   NOTE:  Transferred from [facility] pharm.

## 2012-12-09 ENCOUNTER — Ambulatory Visit (INDEPENDENT_AMBULATORY_CARE_PROVIDER_SITE_OTHER): Payer: Medicare Other | Admitting: *Deleted

## 2012-12-09 ENCOUNTER — Encounter: Payer: Self-pay | Admitting: Internal Medicine

## 2012-12-09 DIAGNOSIS — Z95810 Presence of automatic (implantable) cardiac defibrillator: Secondary | ICD-10-CM | POA: Diagnosis not present

## 2012-12-09 DIAGNOSIS — I2589 Other forms of chronic ischemic heart disease: Secondary | ICD-10-CM

## 2012-12-13 LAB — MDC_IDC_ENUM_SESS_TYPE_REMOTE
Battery Voltage: 2.93 V
Date Time Interrogation Session: 20141117140500
Lead Channel Sensing Intrinsic Amplitude: 5 mV
Lead Channel Setting Pacing Amplitude: 2 V
Zone Setting Detection Interval: 240 ms

## 2012-12-24 ENCOUNTER — Encounter: Payer: Self-pay | Admitting: *Deleted

## 2013-03-14 ENCOUNTER — Ambulatory Visit (INDEPENDENT_AMBULATORY_CARE_PROVIDER_SITE_OTHER): Payer: Medicare Other | Admitting: *Deleted

## 2013-03-14 DIAGNOSIS — I428 Other cardiomyopathies: Secondary | ICD-10-CM | POA: Diagnosis not present

## 2013-03-15 LAB — MDC_IDC_ENUM_SESS_TYPE_REMOTE
Battery Voltage: 2.88 V
Brady Statistic RV Percent Paced: 0 %
Date Time Interrogation Session: 20150221051900
HIGH POWER IMPEDANCE MEASURED VALUE: 53 Ohm
Lead Channel Sensing Intrinsic Amplitude: 3.7 mV
Lead Channel Setting Sensing Sensitivity: 0.9 mV
MDC IDC MSMT LEADCHNL RV IMPEDANCE VALUE: 496 Ohm
MDC IDC SET LEADCHNL RV PACING AMPLITUDE: 2 V
MDC IDC SET LEADCHNL RV PACING PULSEWIDTH: 0.4 ms
MDC IDC SET ZONE DETECTION INTERVAL: 300 ms
Zone Setting Detection Interval: 240 ms
Zone Setting Detection Interval: 350 ms

## 2013-03-19 ENCOUNTER — Encounter: Payer: Self-pay | Admitting: *Deleted

## 2013-04-07 ENCOUNTER — Encounter: Payer: Self-pay | Admitting: Internal Medicine

## 2013-05-08 DIAGNOSIS — S40269A Insect bite (nonvenomous) of unspecified shoulder, initial encounter: Secondary | ICD-10-CM | POA: Diagnosis not present

## 2013-05-08 DIAGNOSIS — W57XXXA Bitten or stung by nonvenomous insect and other nonvenomous arthropods, initial encounter: Secondary | ICD-10-CM | POA: Diagnosis not present

## 2013-05-30 ENCOUNTER — Ambulatory Visit: Payer: Medicare Other | Admitting: Cardiology
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
## 2013-06-09 ENCOUNTER — Other Ambulatory Visit: Payer: Self-pay | Admitting: Cardiology

## 2013-06-09 ENCOUNTER — Ambulatory Visit (INDEPENDENT_AMBULATORY_CARE_PROVIDER_SITE_OTHER): Payer: Medicare Other | Admitting: Cardiology

## 2013-06-09 ENCOUNTER — Encounter: Payer: Self-pay | Admitting: Cardiology

## 2013-06-09 VITALS — BP 129/75 | HR 53 | Ht 71.0 in | Wt 197.0 lb

## 2013-06-09 DIAGNOSIS — I2510 Atherosclerotic heart disease of native coronary artery without angina pectoris: Secondary | ICD-10-CM

## 2013-06-09 DIAGNOSIS — I498 Other specified cardiac arrhythmias: Secondary | ICD-10-CM

## 2013-06-09 DIAGNOSIS — I7090 Unspecified atherosclerosis: Secondary | ICD-10-CM | POA: Diagnosis not present

## 2013-06-09 DIAGNOSIS — R0989 Other specified symptoms and signs involving the circulatory and respiratory systems: Secondary | ICD-10-CM | POA: Diagnosis not present

## 2013-06-09 DIAGNOSIS — Z136 Encounter for screening for cardiovascular disorders: Secondary | ICD-10-CM | POA: Diagnosis not present

## 2013-06-09 DIAGNOSIS — E169 Disorder of pancreatic internal secretion, unspecified: Secondary | ICD-10-CM | POA: Diagnosis not present

## 2013-06-09 DIAGNOSIS — Z139 Encounter for screening, unspecified: Secondary | ICD-10-CM

## 2013-06-09 DIAGNOSIS — R001 Bradycardia, unspecified: Secondary | ICD-10-CM

## 2013-06-09 DIAGNOSIS — E669 Obesity, unspecified: Secondary | ICD-10-CM

## 2013-06-09 DIAGNOSIS — E782 Mixed hyperlipidemia: Secondary | ICD-10-CM | POA: Diagnosis not present

## 2013-06-09 DIAGNOSIS — Z131 Encounter for screening for diabetes mellitus: Secondary | ICD-10-CM

## 2013-06-09 DIAGNOSIS — I1310 Hypertensive heart and chronic kidney disease without heart failure, with stage 1 through stage 4 chronic kidney disease, or unspecified chronic kidney disease: Secondary | ICD-10-CM | POA: Diagnosis not present

## 2013-06-09 NOTE — Patient Instructions (Signed)
Your physician wants you to follow-up in: 6 months You will receive a reminder letter in the mail two months in advance. If you don't receive a letter, please call our office to schedule the follow-up appointment.   Your physician recommends that you continue on your current medications as directed. Please refer to the Current Medication list given to you today.   Please get fasting blood work   Your physician has requested that you have an abdominal aorta duplex. During this test, an ultrasound is used to evaluate the aorta. Allow 30 minutes for this exam. Do not eat after midnight the day before and avoid carbonated beverages  Your physician has requested that you have a carotid duplex. This test is an ultrasound of the carotid arteries in your neck. It looks at blood flow through these arteries that supply the brain with blood. Allow one hour for this exam. There are no restrictions or special instructions.  Thank you for choosing [facility] !

## 2013-06-10 DIAGNOSIS — I1310 Hypertensive heart and chronic kidney disease without heart failure, with stage 1 through stage 4 chronic kidney disease, or unspecified chronic kidney disease: Secondary | ICD-10-CM | POA: Diagnosis not present

## 2013-06-10 DIAGNOSIS — E169 Disorder of pancreatic internal secretion, unspecified: Secondary | ICD-10-CM | POA: Diagnosis not present

## 2013-06-10 DIAGNOSIS — I2510 Atherosclerotic heart disease of native coronary artery without angina pectoris: Secondary | ICD-10-CM | POA: Diagnosis not present

## 2013-06-10 DIAGNOSIS — I7090 Unspecified atherosclerosis: Secondary | ICD-10-CM | POA: Diagnosis not present

## 2013-06-10 LAB — COMPREHENSIVE METABOLIC PANEL
ALT: 23 U/L (ref 0–53)
AST: 24 U/L (ref 0–37)
Albumin: 4 g/dL (ref 3.5–5.2)
Alkaline Phosphatase: 50 U/L (ref 39–117)
BUN: 18 mg/dL (ref 6–23)
CO2: 30 meq/L (ref 19–32)
Calcium: 8.9 mg/dL (ref 8.4–10.5)
Chloride: 107 mEq/L (ref 96–112)
Creat: 0.9 mg/dL (ref 0.50–1.35)
Glucose, Bld: 108 mg/dL — ABNORMAL HIGH (ref 70–99)
Potassium: 4.4 mEq/L (ref 3.5–5.3)
Sodium: 141 mEq/L (ref 135–145)
Total Bilirubin: 0.6 mg/dL (ref 0.2–1.2)
Total Protein: 6.3 g/dL (ref 6.0–8.3)

## 2013-06-10 LAB — CBC
HCT: 44.5 % (ref 39.0–52.0)
Hemoglobin: 15.1 g/dL (ref 13.0–17.0)
MCH: 30.8 pg (ref 26.0–34.0)
MCHC: 33.9 g/dL (ref 30.0–36.0)
MCV: 90.8 fL (ref 78.0–100.0)
Platelets: 177 10*3/uL (ref 150–400)
RBC: 4.9 MIL/uL (ref 4.22–5.81)
RDW: 13.6 % (ref 11.5–15.5)
WBC: 5.4 10*3/uL (ref 4.0–10.5)

## 2013-06-10 LAB — HEMOGLOBIN A1C
Hgb A1c MFr Bld: 6.1 % — ABNORMAL HIGH (ref <5.7)
MEAN PLASMA GLUCOSE: 128 mg/dL — AB (ref <117)

## 2013-06-10 LAB — TSH: TSH: 2.109 u[IU]/mL (ref 0.350–4.500)

## 2013-06-10 LAB — CHOLESTEROL, TOTAL: CHOLESTEROL: 105 mg/dL (ref 0–200)

## 2013-06-11 DIAGNOSIS — Z6827 Body mass index (BMI) 27.0-27.9, adult: Secondary | ICD-10-CM | POA: Diagnosis not present

## 2013-06-11 DIAGNOSIS — J309 Allergic rhinitis, unspecified: Secondary | ICD-10-CM | POA: Diagnosis not present

## 2013-06-11 DIAGNOSIS — J438 Other emphysema: Secondary | ICD-10-CM | POA: Diagnosis not present

## 2013-06-12 ENCOUNTER — Ambulatory Visit (HOSPITAL_COMMUNITY)
Admission: RE | Admit: 2013-06-12 | Discharge: 2013-06-12 | Disposition: A | Discharge status: Home / Self Care | Payer: Medicare Other | Source: Ambulatory Visit | Attending: Cardiology | Admitting: Cardiology

## 2013-06-12 ENCOUNTER — Ambulatory Visit (HOSPITAL_COMMUNITY): Payer: Medicare Other

## 2013-06-12 ENCOUNTER — Encounter: Payer: Self-pay | Admitting: *Deleted

## 2013-06-12 DIAGNOSIS — I6529 Occlusion and stenosis of unspecified carotid artery: Secondary | ICD-10-CM | POA: Diagnosis not present

## 2013-06-12 DIAGNOSIS — I714 Abdominal aortic aneurysm, without rupture, unspecified: Secondary | ICD-10-CM | POA: Insufficient documentation

## 2013-06-12 DIAGNOSIS — Z139 Encounter for screening, unspecified: Secondary | ICD-10-CM

## 2013-06-12 DIAGNOSIS — I658 Occlusion and stenosis of other precerebral arteries: Secondary | ICD-10-CM | POA: Diagnosis not present

## 2013-06-12 DIAGNOSIS — R0989 Other specified symptoms and signs involving the circulatory and respiratory systems: Secondary | ICD-10-CM

## 2013-06-12 LAB — LIPID PANEL
CHOL/HDL RATIO: 2.9 ratio
Cholesterol: 101 mg/dL (ref 0–200)
HDL: 35 mg/dL — ABNORMAL LOW (ref >39)
LDL Cholesterol: 51 mg/dL (ref 0–99)
Triglycerides: 73 mg/dL (ref <150)
VLDL: 15 mg/dL (ref 0–40)

## 2013-06-18 ENCOUNTER — Telehealth: Payer: Self-pay | Admitting: Cardiology

## 2013-06-18 ENCOUNTER — Ambulatory Visit (INDEPENDENT_AMBULATORY_CARE_PROVIDER_SITE_OTHER): Payer: Medicare Other | Admitting: *Deleted

## 2013-06-18 ENCOUNTER — Encounter: Payer: Self-pay | Admitting: Internal Medicine

## 2013-06-18 DIAGNOSIS — I2589 Other forms of chronic ischemic heart disease: Secondary | ICD-10-CM

## 2013-06-18 LAB — MDC_IDC_ENUM_SESS_TYPE_REMOTE
Date Time Interrogation Session: 20150527185100
HIGH POWER IMPEDANCE MEASURED VALUE: 53 Ohm
Lead Channel Impedance Value: 488 Ohm
Lead Channel Sensing Intrinsic Amplitude: 3.5 mV
Lead Channel Setting Pacing Amplitude: 2 V
Lead Channel Setting Pacing Pulse Width: 0.4 ms
Lead Channel Setting Sensing Sensitivity: 0.9 mV
MDC IDC MSMT BATTERY VOLTAGE: 2.83 V
MDC IDC SET ZONE DETECTION INTERVAL: 240 ms
MDC IDC STAT BRADY RV PERCENT PACED: 1 %
Zone Setting Detection Interval: 300 ms
Zone Setting Detection Interval: 350 ms

## 2013-06-18 NOTE — Telephone Encounter (Signed)
Spoke with pt and reminded pt of remote transmission that is due today. Pt verbalized understanding.   

## 2013-06-19 NOTE — Progress Notes (Signed)
Remote ICD transmission.   

## 2013-07-18 ENCOUNTER — Encounter: Payer: Medicare Other | Admitting: Internal Medicine

## 2013-07-28 ENCOUNTER — Ambulatory Visit (INDEPENDENT_AMBULATORY_CARE_PROVIDER_SITE_OTHER): Payer: Medicare Other | Admitting: Internal Medicine

## 2013-07-28 ENCOUNTER — Encounter: Payer: Self-pay | Admitting: Internal Medicine

## 2013-07-28 VITALS — BP 139/74 | HR 48 | Ht 71.0 in | Wt 197.1 lb

## 2013-07-28 DIAGNOSIS — Z95810 Presence of automatic (implantable) cardiac defibrillator: Secondary | ICD-10-CM

## 2013-07-28 DIAGNOSIS — I5022 Chronic systolic (congestive) heart failure: Secondary | ICD-10-CM | POA: Diagnosis not present

## 2013-07-28 DIAGNOSIS — I2589 Other forms of chronic ischemic heart disease: Secondary | ICD-10-CM | POA: Diagnosis not present

## 2013-07-28 LAB — MDC_IDC_ENUM_SESS_TYPE_INCLINIC
Date Time Interrogation Session: 20150706135737
HIGH POWER IMPEDANCE MEASURED VALUE: 47 Ohm
HIGH POWER IMPEDANCE MEASURED VALUE: 47 Ohm
HIGH POWER IMPEDANCE MEASURED VALUE: 48 Ohm
HIGH POWER IMPEDANCE MEASURED VALUE: 48 Ohm
HIGH POWER IMPEDANCE MEASURED VALUE: 48 Ohm
HIGH POWER IMPEDANCE MEASURED VALUE: 49 Ohm
HIGH POWER IMPEDANCE MEASURED VALUE: 50 Ohm
HIGH POWER IMPEDANCE MEASURED VALUE: 61 Ohm
HIGH POWER IMPEDANCE MEASURED VALUE: 63 Ohm
HIGH POWER IMPEDANCE MEASURED VALUE: 63 Ohm
HIGH POWER IMPEDANCE MEASURED VALUE: 66 Ohm
HIGH POWER IMPEDANCE MEASURED VALUE: 68 Ohm
HighPow Impedance: 45 Ohm
HighPow Impedance: 46 Ohm
HighPow Impedance: 46 Ohm
HighPow Impedance: 46 Ohm
HighPow Impedance: 47 Ohm
HighPow Impedance: 48 Ohm
HighPow Impedance: 50 Ohm
HighPow Impedance: 51 Ohm
HighPow Impedance: 53 Ohm
HighPow Impedance: 60 Ohm
HighPow Impedance: 62 Ohm
HighPow Impedance: 63 Ohm
HighPow Impedance: 63 Ohm
HighPow Impedance: 63 Ohm
HighPow Impedance: 64 Ohm
HighPow Impedance: 65 Ohm
HighPow Impedance: 66 Ohm
HighPow Impedance: 66 Ohm
HighPow Impedance: 67 Ohm
Lead Channel Impedance Value: 472 Ohm
Lead Channel Impedance Value: 480 Ohm
Lead Channel Impedance Value: 480 Ohm
Lead Channel Impedance Value: 496 Ohm
Lead Channel Impedance Value: 504 Ohm
Lead Channel Impedance Value: 512 Ohm
Lead Channel Impedance Value: 528 Ohm
Lead Channel Impedance Value: 528 Ohm
Lead Channel Pacing Threshold Amplitude: 1 V
Lead Channel Pacing Threshold Pulse Width: 0.2 ms
Lead Channel Sensing Intrinsic Amplitude: 3.5 mV
Lead Channel Sensing Intrinsic Amplitude: 3.5 mV
Lead Channel Sensing Intrinsic Amplitude: 3.5 mV
Lead Channel Sensing Intrinsic Amplitude: 3.5 mV
Lead Channel Sensing Intrinsic Amplitude: 3.7 mV
Lead Channel Sensing Intrinsic Amplitude: 3.7 mV
Lead Channel Sensing Intrinsic Amplitude: 4.5 mV
Lead Channel Sensing Intrinsic Amplitude: 5.7 mV
Lead Channel Sensing Intrinsic Amplitude: 6 mV
MDC IDC MSMT BATTERY VOLTAGE: 2.79 V
MDC IDC MSMT LEADCHNL RV IMPEDANCE VALUE: 472 Ohm
MDC IDC MSMT LEADCHNL RV IMPEDANCE VALUE: 496 Ohm
MDC IDC MSMT LEADCHNL RV IMPEDANCE VALUE: 496 Ohm
MDC IDC MSMT LEADCHNL RV IMPEDANCE VALUE: 512 Ohm
MDC IDC MSMT LEADCHNL RV IMPEDANCE VALUE: 512 Ohm
MDC IDC MSMT LEADCHNL RV IMPEDANCE VALUE: 528 Ohm
MDC IDC MSMT LEADCHNL RV IMPEDANCE VALUE: 536 Ohm
MDC IDC MSMT LEADCHNL RV SENSING INTR AMPL: 3.5 mV
MDC IDC MSMT LEADCHNL RV SENSING INTR AMPL: 3.7 mV
MDC IDC MSMT LEADCHNL RV SENSING INTR AMPL: 4 mV
MDC IDC MSMT LEADCHNL RV SENSING INTR AMPL: 4.2 mV
MDC IDC MSMT LEADCHNL RV SENSING INTR AMPL: 4.5 mV
MDC IDC MSMT LEADCHNL RV SENSING INTR AMPL: 6.2 mV
MDC IDC SET LEADCHNL RV PACING AMPLITUDE: 2 V
MDC IDC SET LEADCHNL RV PACING PULSEWIDTH: 0.4 ms
MDC IDC SET LEADCHNL RV SENSING SENSITIVITY: 0.9 mV
MDC IDC SET ZONE DETECTION INTERVAL: 300 ms
MDC IDC STAT BRADY RV PERCENT PACED: 1 %
Zone Setting Detection Interval: 240 ms
Zone Setting Detection Interval: 350 ms

## 2013-07-28 NOTE — Assessment & Plan Note (Signed)
His [employer] Maximo ICD is working normally. Will recheck in several months.

## 2013-07-28 NOTE — Assessment & Plan Note (Signed)
His symptoms remain class 2. Will follow. He will continue his current meds. He is encouraged to reduce his sodium intake.
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
## 2013-07-28 NOTE — Patient Instructions (Addendum)
Remote monitoring is used to monitor your Pacemaker of ICD from home. This monitoring reduces the number of office visits required to check your device to one time per year. It allows us to keep an eye on the functioning of your device to ensure it is working properly. You are scheduled for a device check from home on October 7 th. You may send your transmission at any time that day. If you have a wireless device, the transmission will be sent automatically. After your physician reviews your transmission, you will receive a postcard with your next transmission date.  Your physician wants you to follow-up in: 1 year with Dr. Taylor You will receive a reminder letter in the mail two months in advance. If you don't receive a letter, please call our office to schedule the follow-up appointment.  Your physician recommends that you continue on your current medications as directed. Please refer to the Current Medication list given to you today.     

## 2013-07-29 ENCOUNTER — Encounter: Payer: Self-pay | Admitting: Cardiology

## 2013-08-04 ENCOUNTER — Encounter: Payer: Self-pay | Admitting: Internal Medicine

## 2013-08-05 ENCOUNTER — Encounter: Payer: Self-pay | Admitting: Internal Medicine

## 2013-09-25 ENCOUNTER — Other Ambulatory Visit: Payer: Self-pay | Admitting: Cardiovascular Disease

## 2013-10-29 ENCOUNTER — Ambulatory Visit (INDEPENDENT_AMBULATORY_CARE_PROVIDER_SITE_OTHER): Payer: Medicare Other | Admitting: *Deleted

## 2013-10-29 DIAGNOSIS — I420 Dilated cardiomyopathy: Secondary | ICD-10-CM | POA: Diagnosis not present

## 2013-10-29 DIAGNOSIS — I429 Cardiomyopathy, unspecified: Secondary | ICD-10-CM

## 2013-10-29 LAB — MDC_IDC_ENUM_SESS_TYPE_REMOTE
Date Time Interrogation Session: 20151007132100
HighPow Impedance: 53 Ohm
Lead Channel Impedance Value: 480 Ohm
Lead Channel Sensing Intrinsic Amplitude: 3.2 mV
Lead Channel Setting Pacing Amplitude: 2 V
Lead Channel Setting Pacing Pulse Width: 0.4 ms
MDC IDC MSMT BATTERY VOLTAGE: 2.75 V
MDC IDC SET LEADCHNL RV SENSING SENSITIVITY: 0.9 mV
MDC IDC SET ZONE DETECTION INTERVAL: 240 ms
MDC IDC SET ZONE DETECTION INTERVAL: 300 ms
MDC IDC SET ZONE DETECTION INTERVAL: 350 ms

## 2013-10-29 NOTE — Progress Notes (Signed)
Remote ICD transmission.   

## 2013-11-10 DIAGNOSIS — L570 Actinic keratosis: Secondary | ICD-10-CM | POA: Diagnosis not present

## 2013-11-10 DIAGNOSIS — Z85828 Personal history of other malignant neoplasm of skin: Secondary | ICD-10-CM | POA: Diagnosis not present

## 2013-11-14 ENCOUNTER — Encounter: Payer: Self-pay | Admitting: *Deleted

## 2013-11-25 DIAGNOSIS — Z6828 Body mass index (BMI) 28.0-28.9, adult: Secondary | ICD-10-CM | POA: Diagnosis not present

## 2013-11-25 DIAGNOSIS — J069 Acute upper respiratory infection, unspecified: Secondary | ICD-10-CM | POA: Diagnosis not present

## 2013-11-28 ENCOUNTER — Encounter: Payer: Self-pay | Admitting: Internal Medicine

## 2013-12-03 ENCOUNTER — Encounter: Payer: Self-pay | Admitting: Cardiology

## 2013-12-03 ENCOUNTER — Ambulatory Visit (INDEPENDENT_AMBULATORY_CARE_PROVIDER_SITE_OTHER): Payer: Medicare Other | Admitting: Cardiology

## 2013-12-03 VITALS — BP 110/78 | HR 56 | Ht 71.0 in | Wt 195.0 lb

## 2013-12-03 DIAGNOSIS — I350 Nonrheumatic aortic (valve) stenosis: Secondary | ICD-10-CM

## 2013-12-03 DIAGNOSIS — I2589 Other forms of chronic ischemic heart disease: Secondary | ICD-10-CM | POA: Diagnosis not present

## 2013-12-03 DIAGNOSIS — I2510 Atherosclerotic heart disease of native coronary artery without angina pectoris: Secondary | ICD-10-CM | POA: Diagnosis not present

## 2013-12-03 DIAGNOSIS — I10 Essential (primary) hypertension: Secondary | ICD-10-CM | POA: Diagnosis not present

## 2013-12-03 DIAGNOSIS — E782 Mixed hyperlipidemia: Secondary | ICD-10-CM

## 2013-12-03 DIAGNOSIS — I5022 Chronic systolic (congestive) heart failure: Secondary | ICD-10-CM

## 2013-12-03 MED ORDER — ATORVASTATIN CALCIUM 80 MG PO TABS: 80.0000 mg | ORAL_TABLET | Freq: Every day | ORAL | Status: DC

## 2013-12-03 NOTE — Patient Instructions (Signed)
Your physician wants you to follow-up in: 1 year with Dr. Bryna Colander will receive a reminder letter in the mail two months in advance. If you don't receive a letter, please call our office to schedule the follow-up appointment.  Your physician has recommended you make the following change in your medication:   STOP SIMVASTATIN   START ATORVASTATIN 80 MG DAILY  Thank you for choosing [facility]!!
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
## 2013-12-15 ENCOUNTER — Other Ambulatory Visit: Payer: Self-pay | Admitting: Cardiovascular Disease

## 2014-01-29 ENCOUNTER — Ambulatory Visit (INDEPENDENT_AMBULATORY_CARE_PROVIDER_SITE_OTHER): Payer: Medicare Other | Admitting: *Deleted

## 2014-01-29 DIAGNOSIS — I429 Cardiomyopathy, unspecified: Secondary | ICD-10-CM

## 2014-01-29 NOTE — Progress Notes (Signed)
Remote ICD transmission.   

## 2014-02-02 LAB — MDC_IDC_ENUM_SESS_TYPE_REMOTE
Battery Voltage: 2.68 V
Date Time Interrogation Session: 20160107153800
HighPow Impedance: 53 Ohm
Lead Channel Sensing Intrinsic Amplitude: 4.5 mV
Lead Channel Setting Pacing Pulse Width: 0.4 ms
Lead Channel Setting Sensing Sensitivity: 0.9 mV
MDC IDC MSMT LEADCHNL RV IMPEDANCE VALUE: 512 Ohm
MDC IDC SET LEADCHNL RV PACING AMPLITUDE: 2 V
MDC IDC SET ZONE DETECTION INTERVAL: 240 ms
MDC IDC SET ZONE DETECTION INTERVAL: 300 ms
MDC IDC SET ZONE DETECTION INTERVAL: 350 ms
MDC IDC STAT BRADY RV PERCENT PACED: 0 %

## 2014-02-10 ENCOUNTER — Telehealth: Payer: Self-pay | Admitting: *Deleted

## 2014-02-10 NOTE — Telephone Encounter (Signed)
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

## 2014-02-10 NOTE — Telephone Encounter (Signed)
Informed pt to show airport his ICD ID card and to have security to use wand for security reasons. Pt verbalized understanding.

## 2014-02-19 ENCOUNTER — Encounter: Payer: Self-pay | Admitting: Cardiology

## 2014-02-25 ENCOUNTER — Encounter: Payer: Self-pay | Admitting: Internal Medicine

## 2014-03-09 ENCOUNTER — Telehealth: Payer: Self-pay

## 2014-03-09 NOTE — Telephone Encounter (Signed)
Faxed to [facility]

## 2014-05-04 ENCOUNTER — Ambulatory Visit (INDEPENDENT_AMBULATORY_CARE_PROVIDER_SITE_OTHER): Payer: Medicare Other | Admitting: *Deleted

## 2014-05-04 DIAGNOSIS — I429 Cardiomyopathy, unspecified: Secondary | ICD-10-CM | POA: Diagnosis not present

## 2014-05-04 LAB — MDC_IDC_ENUM_SESS_TYPE_REMOTE
Brady Statistic RV Percent Paced: 0 %
HighPow Impedance: 53 Ohm
Lead Channel Sensing Intrinsic Amplitude: 3.7 mV
Lead Channel Setting Pacing Amplitude: 2 V
Lead Channel Setting Pacing Pulse Width: 0.4 ms
Lead Channel Setting Sensing Sensitivity: 0.9 mV
MDC IDC MSMT BATTERY VOLTAGE: 2.66 V
MDC IDC MSMT LEADCHNL RV IMPEDANCE VALUE: 464 Ohm
MDC IDC SESS DTM: 20160411170600
MDC IDC SET ZONE DETECTION INTERVAL: 350 ms
Zone Setting Detection Interval: 240 ms
Zone Setting Detection Interval: 300 ms

## 2014-05-04 NOTE — Progress Notes (Signed)
Remote ICD transmission.   

## 2014-05-18 ENCOUNTER — Encounter: Payer: Self-pay | Admitting: Cardiology

## 2014-05-21 ENCOUNTER — Encounter: Payer: Self-pay | Admitting: Internal Medicine

## 2014-07-07 DIAGNOSIS — E663 Overweight: Secondary | ICD-10-CM | POA: Diagnosis not present

## 2014-07-07 DIAGNOSIS — M129 Arthropathy, unspecified: Secondary | ICD-10-CM | POA: Diagnosis not present

## 2014-07-07 DIAGNOSIS — Z6828 Body mass index (BMI) 28.0-28.9, adult: Secondary | ICD-10-CM | POA: Diagnosis not present

## 2014-07-07 DIAGNOSIS — M7730 Calcaneal spur, unspecified foot: Secondary | ICD-10-CM | POA: Diagnosis not present

## 2014-07-16 DIAGNOSIS — H524 Presbyopia: Secondary | ICD-10-CM | POA: Diagnosis not present

## 2014-07-16 DIAGNOSIS — H40053 Ocular hypertension, bilateral: Secondary | ICD-10-CM | POA: Diagnosis not present

## 2014-07-16 DIAGNOSIS — H5203 Hypermetropia, bilateral: Secondary | ICD-10-CM | POA: Diagnosis not present

## 2014-07-16 DIAGNOSIS — H52223 Regular astigmatism, bilateral: Secondary | ICD-10-CM | POA: Diagnosis not present

## 2014-08-24 ENCOUNTER — Encounter: Payer: Self-pay | Admitting: Internal Medicine

## 2014-08-24 ENCOUNTER — Ambulatory Visit (INDEPENDENT_AMBULATORY_CARE_PROVIDER_SITE_OTHER): Payer: Medicare Other | Admitting: Internal Medicine

## 2014-08-24 VITALS — BP 118/72 | HR 51 | Ht 71.0 in | Wt 200.8 lb

## 2014-08-24 DIAGNOSIS — I429 Cardiomyopathy, unspecified: Secondary | ICD-10-CM

## 2014-08-24 DIAGNOSIS — I350 Nonrheumatic aortic (valve) stenosis: Secondary | ICD-10-CM

## 2014-08-24 DIAGNOSIS — R0602 Shortness of breath: Secondary | ICD-10-CM

## 2014-08-24 DIAGNOSIS — I10 Essential (primary) hypertension: Secondary | ICD-10-CM | POA: Diagnosis not present

## 2014-08-24 DIAGNOSIS — Z95810 Presence of automatic (implantable) cardiac defibrillator: Secondary | ICD-10-CM

## 2014-08-24 DIAGNOSIS — I5022 Chronic systolic (congestive) heart failure: Secondary | ICD-10-CM

## 2014-08-24 LAB — CUP PACEART INCLINIC DEVICE CHECK
Battery Voltage: 2.65 V
HIGH POWER IMPEDANCE MEASURED VALUE: 47 Ohm
HIGH POWER IMPEDANCE MEASURED VALUE: 47 Ohm
HIGH POWER IMPEDANCE MEASURED VALUE: 49 Ohm
HIGH POWER IMPEDANCE MEASURED VALUE: 49 Ohm
HIGH POWER IMPEDANCE MEASURED VALUE: 50 Ohm
HIGH POWER IMPEDANCE MEASURED VALUE: 52 Ohm
HIGH POWER IMPEDANCE MEASURED VALUE: 53 Ohm
HIGH POWER IMPEDANCE MEASURED VALUE: 58 Ohm
HIGH POWER IMPEDANCE MEASURED VALUE: 62 Ohm
HIGH POWER IMPEDANCE MEASURED VALUE: 63 Ohm
HIGH POWER IMPEDANCE MEASURED VALUE: 64 Ohm
HIGH POWER IMPEDANCE MEASURED VALUE: 65 Ohm
HIGH POWER IMPEDANCE MEASURED VALUE: 67 Ohm
HIGH POWER IMPEDANCE MEASURED VALUE: 70 Ohm
HighPow Impedance: 45 Ohm
HighPow Impedance: 46 Ohm
HighPow Impedance: 47 Ohm
HighPow Impedance: 47 Ohm
HighPow Impedance: 47 Ohm
HighPow Impedance: 49 Ohm
HighPow Impedance: 49 Ohm
HighPow Impedance: 52 Ohm
HighPow Impedance: 52 Ohm
HighPow Impedance: 59 Ohm
HighPow Impedance: 60 Ohm
HighPow Impedance: 60 Ohm
HighPow Impedance: 65 Ohm
HighPow Impedance: 66 Ohm
HighPow Impedance: 67 Ohm
HighPow Impedance: 69 Ohm
HighPow Impedance: 69 Ohm
Lead Channel Impedance Value: 488 Ohm
Lead Channel Impedance Value: 496 Ohm
Lead Channel Impedance Value: 504 Ohm
Lead Channel Impedance Value: 512 Ohm
Lead Channel Impedance Value: 512 Ohm
Lead Channel Impedance Value: 528 Ohm
Lead Channel Impedance Value: 536 Ohm
Lead Channel Impedance Value: 536 Ohm
Lead Channel Impedance Value: 536 Ohm
Lead Channel Impedance Value: 544 Ohm
Lead Channel Impedance Value: 544 Ohm
Lead Channel Impedance Value: 568 Ohm
Lead Channel Pacing Threshold Pulse Width: 0.06 ms
Lead Channel Sensing Intrinsic Amplitude: 4.7 mV
Lead Channel Sensing Intrinsic Amplitude: 4.7 mV
Lead Channel Sensing Intrinsic Amplitude: 4.7 mV
Lead Channel Sensing Intrinsic Amplitude: 4.7 mV
Lead Channel Sensing Intrinsic Amplitude: 6 mV
Lead Channel Sensing Intrinsic Amplitude: 6.5 mV
Lead Channel Sensing Intrinsic Amplitude: 6.5 mV
Lead Channel Setting Sensing Sensitivity: 0.9 mV
MDC IDC MSMT LEADCHNL RV IMPEDANCE VALUE: 496 Ohm
MDC IDC MSMT LEADCHNL RV IMPEDANCE VALUE: 512 Ohm
MDC IDC MSMT LEADCHNL RV IMPEDANCE VALUE: 552 Ohm
MDC IDC MSMT LEADCHNL RV PACING THRESHOLD AMPLITUDE: 2.5 V
MDC IDC MSMT LEADCHNL RV SENSING INTR AMPL: 4.5 mV
MDC IDC MSMT LEADCHNL RV SENSING INTR AMPL: 5 mV
MDC IDC MSMT LEADCHNL RV SENSING INTR AMPL: 5.5 mV
MDC IDC MSMT LEADCHNL RV SENSING INTR AMPL: 5.7 mV
MDC IDC MSMT LEADCHNL RV SENSING INTR AMPL: 5.7 mV
MDC IDC MSMT LEADCHNL RV SENSING INTR AMPL: 6 mV
MDC IDC MSMT LEADCHNL RV SENSING INTR AMPL: 6 mV
MDC IDC MSMT LEADCHNL RV SENSING INTR AMPL: 7.7 mV
MDC IDC SESS DTM: 20160801125025
MDC IDC SET LEADCHNL RV PACING AMPLITUDE: 2 V
MDC IDC SET LEADCHNL RV PACING PULSEWIDTH: 0.4 ms
MDC IDC SET ZONE DETECTION INTERVAL: 350 ms
MDC IDC STAT BRADY RV PERCENT PACED: 0 %
Zone Setting Detection Interval: 240 ms
Zone Setting Detection Interval: 300 ms

## 2014-08-24 MED ORDER — FUROSEMIDE 40 MG PO TABS: ORAL_TABLET | ORAL | Status: DC

## 2014-08-24 NOTE — Patient Instructions (Addendum)
Your physician wants you to follow-up in: 12 months with Dr. Lovena Le. You will receive a reminder letter in the mail two months in advance. If you don't receive a letter, please call our office to schedule the follow-up appointment.  Remote monitoring is used to monitor your Pacemaker of ICD from home. This monitoring reduces the number of office visits required to check your device to one time per year. It allows [place] to keep an eye on the functioning of your device to ensure it is working properly. You are scheduled for a device check from home on 11/23/14. You may send your transmission at any time that day. If you have a wireless device, the transmission will be sent automatically. After your physician reviews your transmission, you will receive a postcard with your next transmission date.  START LASIX 40 MG TAKE 1 TABLET DAILY AS NEEDED   Your physician has requested that you have an echocardiogram. Echocardiography is a painless test that uses sound waves to create images of your heart. It provides your doctor with information about the size and shape of your heart and how well your heart's chambers and valves are working. This procedure takes approximately one hour. There are no restrictions for this procedure.   Thank you for choosing [facility]!

## 2014-08-24 NOTE — Assessment & Plan Note (Signed)
His heart failure is a bit worse. He admits to some dietary indiscretion. I have asked the patient to take prn lasix. Also, he has not had a 2D echo in 2 years and he had mild AS by echo then. I have asked him to undergo repeat 2D echo.
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
## 2014-08-24 NOTE — Assessment & Plan Note (Signed)
His exam is a bit more prominent. Will check a 2D echo to see how he is doing with regard to worsening AS.

## 2014-08-24 NOTE — Assessment & Plan Note (Signed)
He denies anginal symptoms. He will continue his current meds.  

## 2014-08-24 NOTE — Assessment & Plan Note (Signed)
His medtronic ICD is working normally. Will recheck in several months. 

## 2014-08-28 ENCOUNTER — Ambulatory Visit (HOSPITAL_COMMUNITY)
Admission: RE | Admit: 2014-08-28 | Discharge: 2014-08-28 | Disposition: A | Discharge status: Home / Self Care | Payer: Medicare Other | Source: Ambulatory Visit | Attending: Internal Medicine | Admitting: Internal Medicine

## 2014-08-28 DIAGNOSIS — I340 Nonrheumatic mitral (valve) insufficiency: Secondary | ICD-10-CM | POA: Insufficient documentation

## 2014-08-28 DIAGNOSIS — I10 Essential (primary) hypertension: Secondary | ICD-10-CM | POA: Diagnosis not present

## 2014-08-28 DIAGNOSIS — Z951 Presence of aortocoronary bypass graft: Secondary | ICD-10-CM | POA: Diagnosis not present

## 2014-08-28 DIAGNOSIS — R0602 Shortness of breath: Secondary | ICD-10-CM

## 2014-08-28 DIAGNOSIS — I350 Nonrheumatic aortic (valve) stenosis: Secondary | ICD-10-CM

## 2014-08-28 DIAGNOSIS — I371 Nonrheumatic pulmonary valve insufficiency: Secondary | ICD-10-CM | POA: Insufficient documentation

## 2014-08-28 DIAGNOSIS — Z952 Presence of prosthetic heart valve: Secondary | ICD-10-CM | POA: Diagnosis not present

## 2014-09-02 DIAGNOSIS — M71571 Other bursitis, not elsewhere classified, right ankle and foot: Secondary | ICD-10-CM | POA: Diagnosis not present

## 2014-09-02 DIAGNOSIS — M7731 Calcaneal spur, right foot: Secondary | ICD-10-CM | POA: Diagnosis not present

## 2014-09-02 DIAGNOSIS — M79609 Pain in unspecified limb: Secondary | ICD-10-CM | POA: Diagnosis not present

## 2014-09-02 DIAGNOSIS — M722 Plantar fascial fibromatosis: Secondary | ICD-10-CM | POA: Diagnosis not present

## 2014-09-09 DIAGNOSIS — M65871 Other synovitis and tenosynovitis, right ankle and foot: Secondary | ICD-10-CM | POA: Diagnosis not present

## 2014-09-09 DIAGNOSIS — M722 Plantar fascial fibromatosis: Secondary | ICD-10-CM | POA: Diagnosis not present

## 2014-09-17 ENCOUNTER — Encounter: Payer: Self-pay | Admitting: Internal Medicine

## 2014-09-23 DIAGNOSIS — S86011A Strain of right Achilles tendon, initial encounter: Secondary | ICD-10-CM | POA: Diagnosis not present

## 2014-09-23 DIAGNOSIS — M7751 Other enthesopathy of right foot: Secondary | ICD-10-CM | POA: Diagnosis not present

## 2014-11-09 DIAGNOSIS — L570 Actinic keratosis: Secondary | ICD-10-CM | POA: Diagnosis not present

## 2014-11-09 DIAGNOSIS — Z85828 Personal history of other malignant neoplasm of skin: Secondary | ICD-10-CM | POA: Diagnosis not present

## 2014-11-23 ENCOUNTER — Ambulatory Visit (INDEPENDENT_AMBULATORY_CARE_PROVIDER_SITE_OTHER): Payer: Medicare Other | Admitting: *Deleted

## 2014-11-23 DIAGNOSIS — I429 Cardiomyopathy, unspecified: Secondary | ICD-10-CM

## 2014-11-24 NOTE — Progress Notes (Signed)
Remote ICD transmission.   

## 2014-11-27 ENCOUNTER — Encounter: Payer: Self-pay | Admitting: Cardiology

## 2014-11-27 LAB — CUP PACEART REMOTE DEVICE CHECK
Brady Statistic RV Percent Paced: 0 %
Date Time Interrogation Session: 20161031170500
HighPow Impedance: 53 Ohm
Implantable Lead Implant Date: 20080624
Implantable Lead Model: 6947
Lead Channel Setting Pacing Amplitude: 2 V
MDC IDC LEAD LOCATION: 753860
MDC IDC MSMT BATTERY VOLTAGE: 2.64 V
MDC IDC MSMT LEADCHNL RV IMPEDANCE VALUE: 496 Ohm
MDC IDC MSMT LEADCHNL RV SENSING INTR AMPL: 4.5 mV
MDC IDC SET LEADCHNL RV PACING PULSEWIDTH: 0.4 ms
MDC IDC SET LEADCHNL RV SENSING SENSITIVITY: 0.9 mV

## 2014-12-03 ENCOUNTER — Other Ambulatory Visit: Payer: Self-pay | Admitting: Cardiology

## 2014-12-03 ENCOUNTER — Encounter: Payer: Self-pay | Admitting: Cardiology

## 2014-12-03 ENCOUNTER — Ambulatory Visit (INDEPENDENT_AMBULATORY_CARE_PROVIDER_SITE_OTHER): Payer: Medicare Other | Admitting: Cardiology

## 2014-12-03 VITALS — BP 124/72 | HR 50 | Ht 71.0 in | Wt 199.0 lb

## 2014-12-03 DIAGNOSIS — E782 Mixed hyperlipidemia: Secondary | ICD-10-CM

## 2014-12-03 DIAGNOSIS — I719 Aortic aneurysm of unspecified site, without rupture: Secondary | ICD-10-CM

## 2014-12-03 DIAGNOSIS — Z79899 Other long term (current) drug therapy: Secondary | ICD-10-CM

## 2014-12-03 DIAGNOSIS — Z136 Encounter for screening for cardiovascular disorders: Secondary | ICD-10-CM | POA: Diagnosis not present

## 2014-12-03 DIAGNOSIS — I10 Essential (primary) hypertension: Secondary | ICD-10-CM

## 2014-12-03 DIAGNOSIS — I5022 Chronic systolic (congestive) heart failure: Secondary | ICD-10-CM
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
## 2014-12-03 NOTE — Patient Instructions (Signed)
Medication Instructions:  Your physician recommends that you continue on your current medications as directed. Please refer to the Current Medication list given to you today.   Labwork: Your physician recommends that you return for lab work in: SOON  CBC CMET TSH A1C MAGNESIUM LIPID    Testing/Procedures: Your physician has requested that you have an abdominal aorta duplex. During this test, an ultrasound is used to evaluate the aorta. Allow 30 minutes for this exam. Do not eat after midnight the day before and avoid carbonated beverages    Follow-Up: Your physician wants you to follow-up in: [place] DR. BRANCH. You will receive a reminder letter in the mail two months in advance. If you don't receive a letter, please call our office to schedule the follow-up appointment.   Any Other Special Instructions Will Be Listed Below (If Applicable).     If you need a refill on your cardiac medications before your next appointment, please call your pharmacy.  Thanks for choosing [facility]!!!

## 2014-12-09 ENCOUNTER — Ambulatory Visit (HOSPITAL_COMMUNITY)
Admission: RE | Admit: 2014-12-09 | Discharge: 2014-12-09 | Disposition: A | Discharge status: Home / Self Care | Payer: Medicare Other | Source: Ambulatory Visit | Attending: Cardiology | Admitting: Cardiology

## 2014-12-09 ENCOUNTER — Other Ambulatory Visit: Payer: Self-pay | Admitting: Cardiology

## 2014-12-09 DIAGNOSIS — I714 Abdominal aortic aneurysm, without rupture, unspecified: Secondary | ICD-10-CM

## 2014-12-09 DIAGNOSIS — Z136 Encounter for screening for cardiovascular disorders: Secondary | ICD-10-CM

## 2014-12-09 DIAGNOSIS — Z79899 Other long term (current) drug therapy: Secondary | ICD-10-CM | POA: Diagnosis not present

## 2014-12-10 LAB — COMPREHENSIVE METABOLIC PANEL
ALBUMIN: 3.8 g/dL (ref 3.6–5.1)
ALK PHOS: 53 U/L (ref 40–115)
ALT: 26 U/L (ref 9–46)
AST: 23 U/L (ref 10–35)
BUN: 18 mg/dL (ref 7–25)
CO2: 28 mmol/L (ref 20–31)
CREATININE: 0.75 mg/dL (ref 0.70–1.18)
Calcium: 9.1 mg/dL (ref 8.6–10.3)
Chloride: 107 mmol/L (ref 98–110)
Glucose, Bld: 102 mg/dL — ABNORMAL HIGH (ref 65–99)
Potassium: 4.5 mmol/L (ref 3.5–5.3)
Sodium: 142 mmol/L (ref 135–146)
TOTAL PROTEIN: 6 g/dL — AB (ref 6.1–8.1)
Total Bilirubin: 0.8 mg/dL (ref 0.2–1.2)

## 2014-12-10 LAB — CBC
HCT: 44.3 % (ref 39.0–52.0)
HEMOGLOBIN: 14.7 g/dL (ref 13.0–17.0)
MCH: 31.5 pg (ref 26.0–34.0)
MCHC: 33.2 g/dL (ref 30.0–36.0)
MCV: 94.9 fL (ref 78.0–100.0)
MPV: 10.8 fL (ref 8.6–12.4)
PLATELETS: 173 10*3/uL (ref 150–400)
RBC: 4.67 MIL/uL (ref 4.22–5.81)
RDW: 13.3 % (ref 11.5–15.5)
WBC: 5.9 10*3/uL (ref 4.0–10.5)

## 2014-12-10 LAB — LIPID PANEL
CHOLESTEROL: 79 mg/dL — AB (ref 125–200)
HDL: 33 mg/dL — ABNORMAL LOW (ref >=40)
LDL Cholesterol: 32 mg/dL (ref <130)
TRIGLYCERIDES: 69 mg/dL (ref <150)
Total CHOL/HDL Ratio: 2.4 Ratio (ref <=5.0)
VLDL: 14 mg/dL (ref <30)

## 2014-12-10 LAB — HEMOGLOBIN A1C
Hgb A1c MFr Bld: 6.4 % — ABNORMAL HIGH (ref <5.7)
MEAN PLASMA GLUCOSE: 137 mg/dL — AB (ref <117)

## 2014-12-10 LAB — TSH: TSH: 1.659 u[IU]/mL (ref 0.350–4.500)

## 2014-12-10 LAB — MAGNESIUM: Magnesium: 1.9 mg/dL (ref 1.5–2.5)

## 2014-12-24 ENCOUNTER — Ambulatory Visit (INDEPENDENT_AMBULATORY_CARE_PROVIDER_SITE_OTHER): Payer: Medicare Other | Admitting: *Deleted

## 2014-12-24 ENCOUNTER — Telehealth: Payer: Self-pay | Admitting: Cardiology

## 2014-12-24 DIAGNOSIS — Z95810 Presence of automatic (implantable) cardiac defibrillator: Secondary | ICD-10-CM

## 2014-12-24 NOTE — Telephone Encounter (Signed)
LMOVM reminding pt to send remote transmission.   

## 2014-12-25 NOTE — Progress Notes (Signed)
Remote ICD transmission.   

## 2014-12-28 DIAGNOSIS — Z6828 Body mass index (BMI) 28.0-28.9, adult: Secondary | ICD-10-CM | POA: Diagnosis not present

## 2014-12-28 DIAGNOSIS — M5431 Sciatica, right side: Secondary | ICD-10-CM | POA: Diagnosis not present

## 2014-12-28 DIAGNOSIS — E663 Overweight: Secondary | ICD-10-CM | POA: Diagnosis not present

## 2014-12-28 DIAGNOSIS — Z1389 Encounter for screening for other disorder: Secondary | ICD-10-CM | POA: Diagnosis not present

## 2015-01-06 LAB — CUP PACEART REMOTE DEVICE CHECK
Battery Voltage: 2.64 V
HighPow Impedance: 53 Ohm
Implantable Lead Implant Date: 20080624
Implantable Lead Location: 753860
Implantable Lead Model: 6947
Lead Channel Impedance Value: 504 Ohm
Lead Channel Sensing Intrinsic Amplitude: 4.5 mV
Lead Channel Setting Pacing Pulse Width: 0.4 ms
MDC IDC SESS DTM: 20161201221400
MDC IDC SET LEADCHNL RV PACING AMPLITUDE: 2 V
MDC IDC SET LEADCHNL RV SENSING SENSITIVITY: 0.9 mV
MDC IDC STAT BRADY RV PERCENT PACED: 0 %

## 2015-01-08 ENCOUNTER — Encounter: Payer: Self-pay | Admitting: Cardiology

## 2015-01-11 DIAGNOSIS — M5410 Radiculopathy, site unspecified: Secondary | ICD-10-CM | POA: Diagnosis not present

## 2015-01-11 DIAGNOSIS — Z6828 Body mass index (BMI) 28.0-28.9, adult: Secondary | ICD-10-CM | POA: Diagnosis not present

## 2015-01-11 DIAGNOSIS — Z1389 Encounter for screening for other disorder: Secondary | ICD-10-CM | POA: Diagnosis not present

## 2015-01-11 DIAGNOSIS — M545 Low back pain: Secondary | ICD-10-CM | POA: Diagnosis not present

## 2015-01-11 DIAGNOSIS — E663 Overweight: Secondary | ICD-10-CM | POA: Diagnosis not present

## 2015-01-21 DIAGNOSIS — M5126 Other intervertebral disc displacement, lumbar region: Secondary | ICD-10-CM | POA: Diagnosis not present

## 2015-01-21 DIAGNOSIS — M7138 Other bursal cyst, other site: Secondary | ICD-10-CM | POA: Diagnosis not present

## 2015-01-21 DIAGNOSIS — M47817 Spondylosis without myelopathy or radiculopathy, lumbosacral region: Secondary | ICD-10-CM | POA: Diagnosis not present

## 2015-01-26 ENCOUNTER — Ambulatory Visit (INDEPENDENT_AMBULATORY_CARE_PROVIDER_SITE_OTHER): Payer: Medicare Other | Admitting: *Deleted

## 2015-01-26 DIAGNOSIS — Z95810 Presence of automatic (implantable) cardiac defibrillator: Secondary | ICD-10-CM

## 2015-01-27 NOTE — Progress Notes (Signed)
Remote ICD transmission.   

## 2015-02-02 LAB — CUP PACEART REMOTE DEVICE CHECK
HighPow Impedance: 53 Ohm
Implantable Lead Model: 6947
Lead Channel Impedance Value: 520 Ohm
Lead Channel Sensing Intrinsic Amplitude: 7.2 mV
Lead Channel Setting Pacing Amplitude: 2 V
MDC IDC LEAD IMPLANT DT: 20080624
MDC IDC LEAD LOCATION: 753860
MDC IDC MSMT BATTERY VOLTAGE: 2.64 V
MDC IDC SESS DTM: 20170103175200
MDC IDC SET LEADCHNL RV PACING PULSEWIDTH: 0.4 ms
MDC IDC SET LEADCHNL RV SENSING SENSITIVITY: 0.9 mV
MDC IDC STAT BRADY RV PERCENT PACED: 0 %

## 2015-02-03 ENCOUNTER — Encounter: Payer: Self-pay | Admitting: Cardiology

## 2015-02-15 DIAGNOSIS — M5126 Other intervertebral disc displacement, lumbar region: Secondary | ICD-10-CM | POA: Diagnosis not present

## 2015-02-16 DIAGNOSIS — Z1389 Encounter for screening for other disorder: Secondary | ICD-10-CM | POA: Diagnosis not present

## 2015-02-16 DIAGNOSIS — M5126 Other intervertebral disc displacement, lumbar region: Secondary | ICD-10-CM | POA: Diagnosis not present

## 2015-02-16 DIAGNOSIS — Z23 Encounter for immunization: Secondary | ICD-10-CM | POA: Diagnosis not present

## 2015-02-16 DIAGNOSIS — Z95810 Presence of automatic (implantable) cardiac defibrillator: Secondary | ICD-10-CM | POA: Diagnosis not present

## 2015-02-16 DIAGNOSIS — Z6828 Body mass index (BMI) 28.0-28.9, adult: Secondary | ICD-10-CM | POA: Diagnosis not present

## 2015-02-16 DIAGNOSIS — E663 Overweight: Secondary | ICD-10-CM | POA: Diagnosis not present

## 2015-02-16 DIAGNOSIS — J449 Chronic obstructive pulmonary disease, unspecified: Secondary | ICD-10-CM | POA: Diagnosis not present

## 2015-02-16 DIAGNOSIS — I2510 Atherosclerotic heart disease of native coronary artery without angina pectoris: Secondary | ICD-10-CM | POA: Diagnosis not present

## 2015-02-23 DIAGNOSIS — M47817 Spondylosis without myelopathy or radiculopathy, lumbosacral region: Secondary | ICD-10-CM | POA: Diagnosis not present

## 2015-02-23 DIAGNOSIS — M5136 Other intervertebral disc degeneration, lumbar region: Secondary | ICD-10-CM | POA: Diagnosis not present

## 2015-02-26 ENCOUNTER — Ambulatory Visit (INDEPENDENT_AMBULATORY_CARE_PROVIDER_SITE_OTHER): Payer: Medicare Other | Admitting: *Deleted

## 2015-02-26 DIAGNOSIS — I429 Cardiomyopathy, unspecified: Secondary | ICD-10-CM

## 2015-03-01 DIAGNOSIS — Z7982 Long term (current) use of aspirin: Secondary | ICD-10-CM | POA: Diagnosis not present

## 2015-03-01 DIAGNOSIS — Z95810 Presence of automatic (implantable) cardiac defibrillator: Secondary | ICD-10-CM | POA: Diagnosis not present

## 2015-03-01 DIAGNOSIS — Z79899 Other long term (current) drug therapy: Secondary | ICD-10-CM | POA: Diagnosis not present

## 2015-03-01 DIAGNOSIS — I2510 Atherosclerotic heart disease of native coronary artery without angina pectoris: Secondary | ICD-10-CM | POA: Diagnosis not present

## 2015-03-01 DIAGNOSIS — M47817 Spondylosis without myelopathy or radiculopathy, lumbosacral region: Secondary | ICD-10-CM | POA: Diagnosis not present

## 2015-03-01 DIAGNOSIS — Z881 Allergy status to other antibiotic agents status: Secondary | ICD-10-CM | POA: Diagnosis not present

## 2015-03-01 DIAGNOSIS — I252 Old myocardial infarction: Secondary | ICD-10-CM | POA: Diagnosis not present

## 2015-03-01 DIAGNOSIS — Z91048 Other nonmedicinal substance allergy status: Secondary | ICD-10-CM | POA: Diagnosis not present

## 2015-03-01 DIAGNOSIS — M5136 Other intervertebral disc degeneration, lumbar region: Secondary | ICD-10-CM | POA: Diagnosis not present

## 2015-03-01 NOTE — Progress Notes (Signed)
Remote ICD transmission.   

## 2015-03-09 DIAGNOSIS — M5126 Other intervertebral disc displacement, lumbar region: Secondary | ICD-10-CM | POA: Diagnosis not present

## 2015-03-24 DIAGNOSIS — Z6828 Body mass index (BMI) 28.0-28.9, adult: Secondary | ICD-10-CM | POA: Diagnosis not present

## 2015-03-24 DIAGNOSIS — E663 Overweight: Secondary | ICD-10-CM | POA: Diagnosis not present

## 2015-03-24 DIAGNOSIS — Z1389 Encounter for screening for other disorder: Secondary | ICD-10-CM | POA: Diagnosis not present

## 2015-03-24 DIAGNOSIS — Z125 Encounter for screening for malignant neoplasm of prostate: Secondary | ICD-10-CM | POA: Diagnosis not present

## 2015-03-24 DIAGNOSIS — Z0000 Encounter for general adult medical examination without abnormal findings: Secondary | ICD-10-CM | POA: Diagnosis not present

## 2015-03-24 DIAGNOSIS — E782 Mixed hyperlipidemia: Secondary | ICD-10-CM | POA: Diagnosis not present

## 2015-03-31 DIAGNOSIS — M47817 Spondylosis without myelopathy or radiculopathy, lumbosacral region: Secondary | ICD-10-CM | POA: Diagnosis not present

## 2015-03-31 DIAGNOSIS — M5136 Other intervertebral disc degeneration, lumbar region: Secondary | ICD-10-CM | POA: Diagnosis not present

## 2015-03-31 DIAGNOSIS — M47814 Spondylosis without myelopathy or radiculopathy, thoracic region: Secondary | ICD-10-CM | POA: Diagnosis not present

## 2015-04-01 ENCOUNTER — Encounter: Payer: Medicare Other | Admitting: *Deleted

## 2015-04-25 LAB — CUP PACEART REMOTE DEVICE CHECK
Brady Statistic RV Percent Paced: 0 %
Date Time Interrogation Session: 20170203173900
HighPow Impedance: 53 Ohm
Implantable Lead Implant Date: 20080624
Implantable Lead Location: 753860
Lead Channel Setting Pacing Amplitude: 2 V
Lead Channel Setting Pacing Pulse Width: 0.4 ms
Lead Channel Setting Sensing Sensitivity: 0.9 mV
MDC IDC MSMT BATTERY VOLTAGE: 2.64 V
MDC IDC MSMT LEADCHNL RV IMPEDANCE VALUE: 568 Ohm
MDC IDC MSMT LEADCHNL RV SENSING INTR AMPL: 5.5 mV

## 2015-04-25 NOTE — Progress Notes (Signed)
ICD battery check

## 2015-04-29 DIAGNOSIS — M47812 Spondylosis without myelopathy or radiculopathy, cervical region: Secondary | ICD-10-CM | POA: Diagnosis not present

## 2015-04-29 DIAGNOSIS — M47814 Spondylosis without myelopathy or radiculopathy, thoracic region: Secondary | ICD-10-CM | POA: Diagnosis not present

## 2015-04-29 DIAGNOSIS — M5489 Other dorsalgia: Secondary | ICD-10-CM | POA: Diagnosis not present

## 2015-04-29 DIAGNOSIS — Z951 Presence of aortocoronary bypass graft: Secondary | ICD-10-CM | POA: Diagnosis not present

## 2015-05-03 ENCOUNTER — Telehealth: Payer: Self-pay | Admitting: Cardiology

## 2015-05-03 ENCOUNTER — Ambulatory Visit (INDEPENDENT_AMBULATORY_CARE_PROVIDER_SITE_OTHER): Payer: Medicare Other | Admitting: *Deleted

## 2015-05-03 DIAGNOSIS — Z95810 Presence of automatic (implantable) cardiac defibrillator: Secondary | ICD-10-CM

## 2015-05-03 NOTE — Progress Notes (Signed)
Remote ICD transmission.   

## 2015-05-03 NOTE — Telephone Encounter (Signed)
Confirmed remote transmission w/ pt.   

## 2015-05-20 DIAGNOSIS — M5126 Other intervertebral disc displacement, lumbar region: Secondary | ICD-10-CM | POA: Diagnosis not present

## 2015-05-20 DIAGNOSIS — M5489 Other dorsalgia: Secondary | ICD-10-CM | POA: Diagnosis not present

## 2015-05-25 LAB — CUP PACEART REMOTE DEVICE CHECK
Battery Voltage: 2.64 V
Brady Statistic RV Percent Paced: 0 %
Date Time Interrogation Session: 20170410153400
HighPow Impedance: 53 Ohm
Implantable Lead Implant Date: 20080624
Implantable Lead Model: 6947
Lead Channel Sensing Intrinsic Amplitude: 5.7 mV
Lead Channel Setting Pacing Amplitude: 2 V
MDC IDC LEAD LOCATION: 753860
MDC IDC MSMT LEADCHNL RV IMPEDANCE VALUE: 544 Ohm
MDC IDC SET LEADCHNL RV PACING PULSEWIDTH: 0.4 ms
MDC IDC SET LEADCHNL RV SENSING SENSITIVITY: 0.9 mV

## 2015-06-01 ENCOUNTER — Encounter (HOSPITAL_COMMUNITY): Payer: Self-pay | Admitting: Emergency Medicine

## 2015-06-01 ENCOUNTER — Emergency Department (HOSPITAL_COMMUNITY): Payer: Medicare Other

## 2015-06-01 ENCOUNTER — Emergency Department (HOSPITAL_COMMUNITY)
Admission: EM | Admit: 2015-06-01 | Discharge: 2015-06-02 | Disposition: A | Discharge status: Home / Self Care | Payer: Medicare Other | Attending: Emergency Medicine | Admitting: Emergency Medicine

## 2015-06-01 DIAGNOSIS — R6883 Chills (without fever): Secondary | ICD-10-CM

## 2015-06-01 DIAGNOSIS — E785 Hyperlipidemia, unspecified: Secondary | ICD-10-CM | POA: Diagnosis not present

## 2015-06-01 DIAGNOSIS — Z87891 Personal history of nicotine dependence: Secondary | ICD-10-CM | POA: Diagnosis not present

## 2015-06-01 DIAGNOSIS — Z7982 Long term (current) use of aspirin: Secondary | ICD-10-CM | POA: Diagnosis not present

## 2015-06-01 DIAGNOSIS — R0600 Dyspnea, unspecified: Secondary | ICD-10-CM | POA: Insufficient documentation

## 2015-06-01 DIAGNOSIS — R0902 Hypoxemia: Secondary | ICD-10-CM

## 2015-06-01 DIAGNOSIS — R0602 Shortness of breath: Secondary | ICD-10-CM | POA: Diagnosis present

## 2015-06-01 DIAGNOSIS — I10 Essential (primary) hypertension: Secondary | ICD-10-CM | POA: Diagnosis not present

## 2015-06-01 DIAGNOSIS — I252 Old myocardial infarction: Secondary | ICD-10-CM | POA: Diagnosis not present

## 2015-06-01 DIAGNOSIS — Z79899 Other long term (current) drug therapy: Secondary | ICD-10-CM | POA: Diagnosis not present

## 2015-06-01 LAB — CBC WITH DIFFERENTIAL/PLATELET
BAND NEUTROPHILS: 0 %
Basophils Absolute: 0 10*3/uL (ref 0.0–0.1)
Basophils Relative: 0 %
EOS PCT: 2 %
Eosinophils Absolute: 0.1 10*3/uL (ref 0.0–0.7)
HEMATOCRIT: 44.9 % (ref 39.0–52.0)
Hemoglobin: 14.8 g/dL (ref 13.0–17.0)
LYMPHS ABS: 1 10*3/uL (ref 0.7–4.0)
LYMPHS PCT: 9 %
MCH: 31.9 pg (ref 26.0–34.0)
MCHC: 33 g/dL (ref 30.0–36.0)
MCV: 96.8 fL (ref 78.0–100.0)
MONO ABS: 0.4 10*3/uL (ref 0.1–1.0)
MONOS PCT: 5 %
Neutro Abs: 5.8 10*3/uL (ref 1.7–7.7)
Neutrophils Relative %: 84 %
Platelets: 160 10*3/uL (ref 150–400)
RBC: 4.64 MIL/uL (ref 4.22–5.81)
RDW: 13.1 % (ref 11.5–15.5)
WBC: 7 10*3/uL (ref 4.0–10.5)

## 2015-06-01 LAB — BASIC METABOLIC PANEL
ANION GAP: 10 (ref 5–15)
BUN: 17 mg/dL (ref 6–20)
CO2: 25 mmol/L (ref 22–32)
Calcium: 9.1 mg/dL (ref 8.9–10.3)
Chloride: 103 mmol/L (ref 101–111)
Creatinine, Ser: 0.88 mg/dL (ref 0.61–1.24)
GFR calc Af Amer: >60 mL/min (ref >60)
GFR calc non Af Amer: >60 mL/min (ref >60)
GLUCOSE: 107 mg/dL — AB (ref 65–99)
Potassium: 3.6 mmol/L (ref 3.5–5.1)
Sodium: 138 mmol/L (ref 135–145)

## 2015-06-01 LAB — D-DIMER, QUANTITATIVE (NOT AT ARMC): D DIMER QUANT: 1.26 ug{FEU}/mL — AB (ref 0.00–0.50)

## 2015-06-01 LAB — TROPONIN I: Troponin I: 0.12 ng/mL — ABNORMAL HIGH (ref <0.031)

## 2015-06-01 LAB — BRAIN NATRIURETIC PEPTIDE: B Natriuretic Peptide: 44 pg/mL (ref 0.0–100.0)

## 2015-06-01 NOTE — ED Notes (Signed)
From radiology 
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
## 2015-06-01 NOTE — ED Notes (Signed)
Pt reports 3 day history of shortness of breath and chest discomfort, but did not follow due to wife ill and other concerns. He is alert, speaks in complete sentences without shortness of breath

## 2015-06-01 NOTE — ED Notes (Signed)
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 Patient denies chest pain and states shortness of breath has resolved.

## 2015-06-02 ENCOUNTER — Encounter: Payer: Self-pay | Admitting: Cardiology

## 2015-06-02 ENCOUNTER — Emergency Department (HOSPITAL_COMMUNITY): Payer: Medicare Other

## 2015-06-02 DIAGNOSIS — R0902 Hypoxemia: Secondary | ICD-10-CM | POA: Diagnosis not present

## 2015-06-02 LAB — TROPONIN I: Troponin I: 0.12 ng/mL — ABNORMAL HIGH (ref <0.031)

## 2015-06-02 MED ORDER — IOPAMIDOL (ISOVUE-370) INJECTION 76%
100.0000 mL | Freq: Once | INTRAVENOUS | Status: AC | PRN
  Administered 2015-06-02: 100 mL via INTRAVENOUS

## 2015-06-02 NOTE — ED Notes (Signed)
Dr Tomi Bamberger in to speak with pt and family

## 2015-06-02 NOTE — ED Notes (Addendum)
While ambulating around the nurses station pt dropped one time to a pulse ox of 87 percent but then rebounded to a 90-91 pulse ox while walking

## 2015-06-02 NOTE — ED Notes (Signed)
Dr Tomi Bamberger in to reassess

## 2015-06-02 NOTE — Discharge Instructions (Signed)
You need to see your primary care doctor soon to discuss if you would benefit from having oxygen. When we walked you around the ED your oxygen dropped to 87% which is too low and is a stress to your heart. YOU NEED TO TAKE IT EASY AND NOT EXERT YOURSELF UNTIL YOU CAN TALK TO YOUR DOCTOR!!!!   Return to the ED if you have another episode with struggling to breathe.

## 2015-06-03 ENCOUNTER — Ambulatory Visit (INDEPENDENT_AMBULATORY_CARE_PROVIDER_SITE_OTHER): Payer: Medicare Other | Admitting: *Deleted

## 2015-06-03 ENCOUNTER — Telehealth: Payer: Self-pay | Admitting: Cardiology

## 2015-06-03 DIAGNOSIS — Z6828 Body mass index (BMI) 28.0-28.9, adult: Secondary | ICD-10-CM | POA: Diagnosis not present

## 2015-06-03 DIAGNOSIS — R0902 Hypoxemia: Secondary | ICD-10-CM | POA: Diagnosis not present

## 2015-06-03 DIAGNOSIS — I350 Nonrheumatic aortic (valve) stenosis: Secondary | ICD-10-CM | POA: Diagnosis not present

## 2015-06-03 DIAGNOSIS — Z1389 Encounter for screening for other disorder: Secondary | ICD-10-CM | POA: Diagnosis not present

## 2015-06-03 DIAGNOSIS — I420 Dilated cardiomyopathy: Secondary | ICD-10-CM | POA: Diagnosis not present

## 2015-06-03 DIAGNOSIS — Z95810 Presence of automatic (implantable) cardiac defibrillator: Secondary | ICD-10-CM | POA: Diagnosis not present

## 2015-06-03 DIAGNOSIS — I2510 Atherosclerotic heart disease of native coronary artery without angina pectoris: Secondary | ICD-10-CM | POA: Diagnosis not present

## 2015-06-03 DIAGNOSIS — J449 Chronic obstructive pulmonary disease, unspecified: Secondary | ICD-10-CM | POA: Diagnosis not present

## 2015-06-03 NOTE — Progress Notes (Signed)
Remote ICD transmission.   

## 2015-06-03 NOTE — Telephone Encounter (Signed)
LMOVM reminding pt to send remote transmission.   

## 2015-06-04 ENCOUNTER — Ambulatory Visit: Payer: Medicare Other | Admitting: Cardiology

## 2015-06-04 ENCOUNTER — Emergency Department (HOSPITAL_COMMUNITY): Payer: Medicare Other

## 2015-06-04 ENCOUNTER — Encounter (HOSPITAL_COMMUNITY): Payer: Self-pay

## 2015-06-04 ENCOUNTER — Observation Stay (HOSPITAL_BASED_OUTPATIENT_CLINIC_OR_DEPARTMENT_OTHER): Payer: Medicare Other

## 2015-06-04 ENCOUNTER — Observation Stay (HOSPITAL_COMMUNITY)
Admission: EM | Admit: 2015-06-04 | Discharge: 2015-06-06 | Disposition: A | Discharge status: Home / Self Care | Payer: Medicare Other | Attending: Internal Medicine | Admitting: Internal Medicine

## 2015-06-04 DIAGNOSIS — I5022 Chronic systolic (congestive) heart failure: Secondary | ICD-10-CM | POA: Diagnosis not present

## 2015-06-04 DIAGNOSIS — Z79899 Other long term (current) drug therapy: Secondary | ICD-10-CM | POA: Insufficient documentation

## 2015-06-04 DIAGNOSIS — E785 Hyperlipidemia, unspecified: Secondary | ICD-10-CM | POA: Insufficient documentation

## 2015-06-04 DIAGNOSIS — R079 Chest pain, unspecified: Secondary | ICD-10-CM | POA: Diagnosis not present

## 2015-06-04 DIAGNOSIS — Z7982 Long term (current) use of aspirin: Secondary | ICD-10-CM | POA: Diagnosis not present

## 2015-06-04 DIAGNOSIS — J449 Chronic obstructive pulmonary disease, unspecified: Secondary | ICD-10-CM | POA: Diagnosis present

## 2015-06-04 DIAGNOSIS — I10 Essential (primary) hypertension: Secondary | ICD-10-CM | POA: Diagnosis present

## 2015-06-04 DIAGNOSIS — R0600 Dyspnea, unspecified: Secondary | ICD-10-CM | POA: Diagnosis not present

## 2015-06-04 DIAGNOSIS — Z87891 Personal history of nicotine dependence: Secondary | ICD-10-CM | POA: Insufficient documentation

## 2015-06-04 DIAGNOSIS — J410 Simple chronic bronchitis: Secondary | ICD-10-CM | POA: Diagnosis not present

## 2015-06-04 DIAGNOSIS — R0602 Shortness of breath: Secondary | ICD-10-CM

## 2015-06-04 DIAGNOSIS — R062 Wheezing: Secondary | ICD-10-CM | POA: Diagnosis not present

## 2015-06-04 DIAGNOSIS — R0682 Tachypnea, not elsewhere classified: Secondary | ICD-10-CM | POA: Diagnosis not present

## 2015-06-04 DIAGNOSIS — I214 Non-ST elevation (NSTEMI) myocardial infarction: Principal | ICD-10-CM | POA: Insufficient documentation

## 2015-06-04 LAB — I-STAT CHEM 8, ED
BUN: 15 mg/dL (ref 6–20)
CHLORIDE: 103 mmol/L (ref 101–111)
CREATININE: 0.7 mg/dL (ref 0.61–1.24)
Calcium, Ion: 1.12 mmol/L — ABNORMAL LOW (ref 1.13–1.30)
Glucose, Bld: 130 mg/dL — ABNORMAL HIGH (ref 65–99)
HEMATOCRIT: 44 % (ref 39.0–52.0)
Hemoglobin: 15 g/dL (ref 13.0–17.0)
POTASSIUM: 4.2 mmol/L (ref 3.5–5.1)
Sodium: 141 mmol/L (ref 135–145)
TCO2: 23 mmol/L (ref 0–100)

## 2015-06-04 LAB — ECHOCARDIOGRAM COMPLETE
HEIGHTINCHES: 71 in
WEIGHTICAEL: 2160 [oz_av]

## 2015-06-04 LAB — CBC WITH DIFFERENTIAL/PLATELET
BASOS ABS: 0 10*3/uL (ref 0.0–0.1)
BASOS PCT: 0 %
Eosinophils Absolute: 0.1 10*3/uL (ref 0.0–0.7)
Eosinophils Relative: 1 %
HEMATOCRIT: 42.9 % (ref 39.0–52.0)
Hemoglobin: 14.2 g/dL (ref 13.0–17.0)
LYMPHS PCT: 6 %
Lymphs Abs: 0.4 10*3/uL — ABNORMAL LOW (ref 0.7–4.0)
MCH: 31.6 pg (ref 26.0–34.0)
MCHC: 33.1 g/dL (ref 30.0–36.0)
MCV: 95.5 fL (ref 78.0–100.0)
Monocytes Absolute: 0.1 10*3/uL (ref 0.1–1.0)
Monocytes Relative: 1 %
NEUTROS ABS: 5.8 10*3/uL (ref 1.7–7.7)
Neutrophils Relative %: 92 %
Platelets: 133 10*3/uL — ABNORMAL LOW (ref 150–400)
RBC: 4.49 MIL/uL (ref 4.22–5.81)
RDW: 12.9 % (ref 11.5–15.5)
WBC: 6.4 10*3/uL (ref 4.0–10.5)

## 2015-06-04 LAB — I-STAT TROPONIN, ED: Troponin i, poc: 0.01 ng/mL (ref 0.00–0.08)

## 2015-06-04 LAB — TROPONIN I
Troponin I: 0.04 ng/mL — ABNORMAL HIGH (ref <0.031)
Troponin I: 0.06 ng/mL — ABNORMAL HIGH (ref <0.031)

## 2015-06-04 LAB — BRAIN NATRIURETIC PEPTIDE: B NATRIURETIC PEPTIDE 5: 56 pg/mL (ref 0.0–100.0)

## 2015-06-04 MED ORDER — LISINOPRIL 5 MG PO TABS
2.5000 mg | ORAL_TABLET | Freq: Every day | ORAL | Status: DC
  Administered 2015-06-05: 2.5 mg via ORAL
  Filled 2015-06-04: qty 1

## 2015-06-04 MED ORDER — SODIUM CHLORIDE 0.9 % IV SOLN: 250.0000 mL | INTRAVENOUS | Status: DC | PRN

## 2015-06-04 MED ORDER — ALBUTEROL SULFATE (2.5 MG/3ML) 0.083% IN NEBU
2.5000 mg | INHALATION_SOLUTION | Freq: Four times a day (QID) | RESPIRATORY_TRACT | Status: DC
  Administered 2015-06-04: 2.5 mg via RESPIRATORY_TRACT
  Filled 2015-06-04: qty 3

## 2015-06-04 MED ORDER — NITROGLYCERIN 0.4 MG SL SUBL: 0.4000 mg | SUBLINGUAL_TABLET | SUBLINGUAL | Status: DC | PRN

## 2015-06-04 MED ORDER — ALBUTEROL SULFATE (2.5 MG/3ML) 0.083% IN NEBU
2.5000 mg | INHALATION_SOLUTION | Freq: Once | RESPIRATORY_TRACT | Status: AC
  Administered 2015-06-04: 2.5 mg via RESPIRATORY_TRACT
  Filled 2015-06-04: qty 3

## 2015-06-04 MED ORDER — IPRATROPIUM-ALBUTEROL 0.5-2.5 (3) MG/3ML IN SOLN
3.0000 mL | Freq: Once | RESPIRATORY_TRACT | Status: AC
  Administered 2015-06-04: 3 mL via RESPIRATORY_TRACT
  Filled 2015-06-04: qty 3

## 2015-06-04 MED ORDER — FUROSEMIDE 10 MG/ML IJ SOLN
40.0000 mg | Freq: Once | INTRAMUSCULAR | Status: AC
  Administered 2015-06-04: 40 mg via INTRAVENOUS
  Filled 2015-06-04: qty 4

## 2015-06-04 MED ORDER — PREDNISONE 20 MG PO TABS
40.0000 mg | ORAL_TABLET | Freq: Every day | ORAL | Status: DC
  Administered 2015-06-05 – 2015-06-06 (×2): 40 mg via ORAL
  Filled 2015-06-04 (×2): qty 2

## 2015-06-04 MED ORDER — SODIUM CHLORIDE 0.9% FLUSH: 3.0000 mL | INTRAVENOUS | Status: DC | PRN

## 2015-06-04 MED ORDER — ONDANSETRON HCL 4 MG/2ML IJ SOLN: 4.0000 mg | Freq: Four times a day (QID) | INTRAMUSCULAR | Status: DC | PRN

## 2015-06-04 MED ORDER — CARVEDILOL 12.5 MG PO TABS
12.5000 mg | ORAL_TABLET | Freq: Two times a day (BID) | ORAL | Status: DC
  Administered 2015-06-04 – 2015-06-06 (×4): 12.5 mg via ORAL
  Filled 2015-06-04 (×4): qty 1

## 2015-06-04 MED ORDER — ACETAMINOPHEN 325 MG PO TABS: 650.0000 mg | ORAL_TABLET | ORAL | Status: DC | PRN

## 2015-06-04 MED ORDER — ASPIRIN 81 MG PO CHEW
81.0000 mg | CHEWABLE_TABLET | Freq: Every day | ORAL | Status: DC
  Administered 2015-06-05: 81 mg via ORAL
  Filled 2015-06-04: qty 1

## 2015-06-04 MED ORDER — ASPIRIN 81 MG PO CHEW
324.0000 mg | CHEWABLE_TABLET | Freq: Once | ORAL | Status: AC
  Administered 2015-06-04: 324 mg via ORAL

## 2015-06-04 MED ORDER — SODIUM CHLORIDE 0.9% FLUSH
3.0000 mL | Freq: Two times a day (BID) | INTRAVENOUS | Status: DC
  Administered 2015-06-04 – 2015-06-05 (×3): 3 mL via INTRAVENOUS

## 2015-06-04 MED ORDER — ASPIRIN 81 MG PO CHEW
CHEWABLE_TABLET | ORAL | Status: AC
  Filled 2015-06-04: qty 3

## 2015-06-04 MED ORDER — ATORVASTATIN CALCIUM 40 MG PO TABS
80.0000 mg | ORAL_TABLET | Freq: Every day | ORAL | Status: DC
  Administered 2015-06-04 – 2015-06-05 (×2): 80 mg via ORAL
  Filled 2015-06-04 (×2): qty 2

## 2015-06-04 MED ORDER — ALBUTEROL SULFATE (2.5 MG/3ML) 0.083% IN NEBU
2.5000 mg | INHALATION_SOLUTION | Freq: Two times a day (BID) | RESPIRATORY_TRACT | Status: DC
  Administered 2015-06-05 (×2): 2.5 mg via RESPIRATORY_TRACT
  Filled 2015-06-04 (×2): qty 3

## 2015-06-04 MED ORDER — PERFLUTREN LIPID MICROSPHERE
1.0000 mL | INTRAVENOUS | Status: AC | PRN
  Filled 2015-06-04: qty 10

## 2015-06-04 MED ORDER — ENOXAPARIN SODIUM 40 MG/0.4ML ~~LOC~~ SOLN
40.0000 mg | SUBCUTANEOUS | Status: DC
  Administered 2015-06-04 – 2015-06-05 (×2): 40 mg via SUBCUTANEOUS
  Filled 2015-06-04 (×2): qty 0.4

## 2015-06-04 MED ORDER — ALBUTEROL SULFATE (2.5 MG/3ML) 0.083% IN NEBU: 2.5000 mg | INHALATION_SOLUTION | RESPIRATORY_TRACT | Status: DC | PRN

## 2015-06-04 MED ORDER — FUROSEMIDE 40 MG PO TABS
40.0000 mg | ORAL_TABLET | Freq: Every day | ORAL | Status: DC
  Administered 2015-06-05: 40 mg via ORAL
  Filled 2015-06-04: qty 1

## 2015-06-04 NOTE — ED Notes (Signed)
Echo completed
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
## 2015-06-04 NOTE — ED Notes (Signed)
Pt brought in by EMS,. Reports SOB started last night and worsened today. Denies cough. O2 sat 91% with EMS. Duoneb and solumedrol 125 administered with sats increase 97 %. Currently 90 % on room air
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
## 2015-06-04 NOTE — ED Notes (Signed)
ECHO in process at this time

## 2015-06-04 NOTE — ED Notes (Signed)
Total of 8 mg of delfinity administerd by RN during ECHO procedure

## 2015-06-04 NOTE — ED Notes (Signed)
Pt reports he had one baby asa 81mg . Per Dr Elise Benne adm 3 baby asa now
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
## 2015-06-05 DIAGNOSIS — I10 Essential (primary) hypertension: Secondary | ICD-10-CM | POA: Diagnosis not present

## 2015-06-05 DIAGNOSIS — I5022 Chronic systolic (congestive) heart failure: Secondary | ICD-10-CM | POA: Diagnosis not present

## 2015-06-05 DIAGNOSIS — R0600 Dyspnea, unspecified: Secondary | ICD-10-CM

## 2015-06-05 DIAGNOSIS — I214 Non-ST elevation (NSTEMI) myocardial infarction: Secondary | ICD-10-CM | POA: Diagnosis not present

## 2015-06-05 LAB — URINALYSIS, ROUTINE W REFLEX MICROSCOPIC
BILIRUBIN URINE: NEGATIVE
Glucose, UA: 100 mg/dL — AB
Hgb urine dipstick: NEGATIVE
KETONES UR: NEGATIVE mg/dL
LEUKOCYTES UA: NEGATIVE
NITRITE: NEGATIVE
PROTEIN: NEGATIVE mg/dL
Specific Gravity, Urine: <1.005 — ABNORMAL LOW (ref 1.005–1.030)
pH: 5 (ref 5.0–8.0)

## 2015-06-05 LAB — BASIC METABOLIC PANEL
ANION GAP: 6 (ref 5–15)
BUN: 17 mg/dL (ref 6–20)
CALCIUM: 8.8 mg/dL — AB (ref 8.9–10.3)
CO2: 25 mmol/L (ref 22–32)
CREATININE: 0.71 mg/dL (ref 0.61–1.24)
Chloride: 104 mmol/L (ref 101–111)
Glucose, Bld: 182 mg/dL — ABNORMAL HIGH (ref 65–99)
Potassium: 3.6 mmol/L (ref 3.5–5.1)
SODIUM: 135 mmol/L (ref 135–145)

## 2015-06-05 LAB — TSH: TSH: 0.527 u[IU]/mL (ref 0.350–4.500)

## 2015-06-05 LAB — TROPONIN I
TROPONIN I: 0.05 ng/mL — AB (ref <0.031)
TROPONIN I: 0.05 ng/mL — AB (ref <0.031)

## 2015-06-05 NOTE — Progress Notes (Signed)
PROGRESS NOTE        PATIENT DETAILS Name: [name] Age: 70 y.o. Sex: male Date of Birth: [birth year] Admit Date: 06/04/2015 Admitting Physician Truett Mainland, DO [id number], Betsy Coder, MD  Brief Narrative: Patient is a 70 y.o. male with PMHx CAD with prior CABG in 09/2008 [facility]), chronic systolic HF with LVEF [id number], AICD, HTN, HL, AS s/p tissue AVR, AAA admitted for evaluation of unexplained chills that has occurred 3-4 times since 5/9. These episodes last approximately 30-40 minutes, he needs a blanket/extra covering to keep warm during these episodes. Denies sweating. Did not check temperature during these episodes. During these episodes, he gets short of breath. He was seen in the emergency room on 5/9 with similar complaints and refused inpatient hospitalization. He presented on 5/12 similar symptoms and was admitted for further evaluation.  Subjective: No chills since hospitalization. Afebrile. No other complaints.  Assessment/Plan: Principal Problem: Episodes of chills:  Etiology unknown at this time-but given history of AVR, reasonable to get blood cultures to make sure he does not have bacteremia/endocarditis.No leukocytosis, no fever-no other foci of infection apparent. Will also check TSH. He did give history of having shortness of breath during these episodes, CT angiogram chest on 5/10 negative for pulmonary embolism or pneumonia.  Active Problems: Chronic systolic heart failure (EF 25-30% by TTE on 5/12): Clinically compensated, continue Lasix, Coreg and lisinopril. Cardiology following, AICD in place  CAD-s/p CABG 2010: Without chest pain. Troponins very minimally elevated-with flat trend not consistent with ACS. Per cardiology no plans for ischemic evaluation. Continue aspirin, statin and beta blocker.  History of aortic stenosis-s/p bioprosthetic AVR in 2010: TTE on 5/12-did not show any significant gradient across the  prosthetic valve.  Hypertension: Controlled, continue Coreg and lisinopril  DVT Prophylaxis: Prophylactic Lovenox   Code Status: Full code   Family Communication: None at bedside  Disposition Plan: Remain inpatient-home 5/14-if blood cultures negative  Antimicrobial agents: None  Procedures: Echo 5/12: Left ventricle:  The cavity size was normal. Wall thickness was  normal. Systolic function was severely reduced. The estimated  ejection fraction was in the range of 25% to 30%. Doppler  parameters are consistent with abnormal left ventricular  relaxation (grade 1 diastolic dysfunction).  Aortic valve:   The mean gradient acroess the prosthetic is mildly  elevated. Mildly calcified annulus. Normal thickness leaflets.  CONSULTS:  cardiology  Time spent: 25 minutes-Greater than 50% of this time was spent in counseling, explanation of diagnosis, planning of further management, and coordination of care.  MEDICATIONS: Anti-infectives    None      Scheduled Meds: . albuterol  2.5 mg Nebulization BID  . aspirin  81 mg Oral Daily  . atorvastatin  80 mg Oral q1800  . carvedilol  12.5 mg Oral BID WC  . enoxaparin (LOVENOX) injection  40 mg Subcutaneous Q24H  . furosemide  40 mg Oral Daily  . lisinopril  2.5 mg Oral Daily  . predniSONE  40 mg Oral Q breakfast  . sodium chloride flush  3 mL Intravenous Q12H   Continuous Infusions:  PRN Meds:.sodium chloride, acetaminophen, albuterol, nitroGLYCERIN, ondansetron (ZOFRAN) IV, sodium chloride flush   PHYSICAL EXAM: Vital signs: Filed Vitals:   06/04/15 2029 06/04/15 2228 06/05/15 0514 06/05/15 1003  BP:  102/54 120/60   Pulse:  73 60   Temp:  98.2 F (  36.8 C) 98.3 F (36.8 C)   TempSrc:  Oral Oral   Resp:  20 20   Height:      Weight:      SpO2: 93% 94% 96% 93%   Filed Weights   06/04/15 1356 06/04/15 1818  Weight: 61.236 kg (135 lb) 90.493 kg (199 lb 8 oz)   Body mass index is 27.84 kg/(m^2).   Gen  Exam: Awake and alert with clear speech. Not in any distress  Neck: Supple, No JVD.   Chest: B/L Clear.   CVS: S1 S2 Regular,3/6 syst murmurs.  Abdomen: soft, BS +, non tender, non distended.  Extremities: no edema, lower extremities warm to touch. Neurologic: Non Focal.   Skin: No Rash or lesions   Wounds: N/A.   LABORATORY DATA: CBC:  Recent Labs Lab 06/01/15 2240 06/04/15 1406 06/04/15 1409  WBC 7.0 6.4  --   NEUTROABS 5.8 5.8  --   HGB 14.8 14.2 15.0  HCT 44.9 42.9 44.0  MCV 96.8 95.5  --   PLT 160 133*  --     Basic Metabolic Panel:  Recent Labs Lab 06/01/15 2240 06/04/15 1409 06/05/15 0554  NA 138 141 135  K 3.6 4.2 3.6  CL 103 103 104  CO2 25  --  25  GLUCOSE 107* 130* 182*  BUN 17 15 17   CREATININE 0.88 0.70 0.71  CALCIUM 9.1  --  8.8*    GFR: Estimated Creatinine Clearance: 97.5 mL/min (by C-G formula based on Cr of 0.71).  Liver Function Tests: No results for input(s): AST, ALT, ALKPHOS, BILITOT, PROT, ALBUMIN in the last 168 hours. No results for input(s): LIPASE, AMYLASE in the last 168 hours. No results for input(s): AMMONIA in the last 168 hours.  Coagulation Profile: No results for input(s): INR, PROTIME in the last 168 hours.  Cardiac Enzymes:  Recent Labs Lab 06/02/15 0136 06/04/15 1412 06/04/15 1852 06/04/15 2351 06/05/15 0554  TROPONINI 0.12* 0.04* 0.06* 0.05* 0.05*    BNP (last 3 results) No results for input(s): PROBNP in the last 8760 hours.  HbA1C: No results for input(s): HGBA1C in the last 72 hours.  CBG: No results for input(s): GLUCAP in the last 168 hours.  Lipid Profile: No results for input(s): CHOL, HDL, LDLCALC, TRIG, CHOLHDL, LDLDIRECT in the last 72 hours.  Thyroid Function Tests: No results for input(s): TSH, T4TOTAL, FREET4, T3FREE, THYROIDAB in the last 72 hours.  Anemia Panel: No results for input(s): VITAMINB12, FOLATE, FERRITIN, TIBC, IRON, RETICCTPCT in the last 72 hours.  Urine analysis:      Component Value Date/Time   COLORURINE YELLOW 09/30/2008 [place] 09/30/2008 0939   LABSPEC 1.012 09/30/2008 0939   PHURINE 6.0 09/30/2008 0939   GLUCOSEU NEGATIVE 09/30/2008 0939   HGBUR NEGATIVE 09/30/2008 0939   BILIRUBINUR NEGATIVE 09/30/2008 0939   KETONESUR NEGATIVE 09/30/2008 0939   PROTEINUR NEGATIVE 09/30/2008 0939   UROBILINOGEN 0.2 09/30/2008 0939   NITRITE NEGATIVE 09/30/2008 0939   LEUKOCYTESUR  09/30/2008 0939    NEGATIVE MICROSCOPIC NOT DONE ON URINES WITH NEGATIVE PROTEIN, BLOOD, LEUKOCYTES, NITRITE, OR GLUCOSE <1000 mg/dL.    Sepsis Labs: Lactic Acid, Venous No results found for: LATICACIDVEN  MICROBIOLOGY: No results found for this or any previous visit (from the past 240 hour(s)).  RADIOLOGY STUDIES/RESULTS: Dg Chest 2 View  06/01/2015  CLINICAL DATA:  Dyspnea and central chest heaviness for 3 days. EXAM: CHEST  2 VIEW COMPARISON:  06/09/2010 FINDINGS: There is sternotomy and  CABG. There is aortic valvuloplasty. There are intact appearances of the transvenous cardiac leads. There is unchanged moderate hyperinflation. The lungs are clear. The pulmonary vasculature is normal. There is no pleural effusion. Hilar and mediastinal contours are unremarkable and unchanged. IMPRESSION: Hyperinflation.  No acute cardiopulmonary findings. Electronically Signed   By: Andreas Newport M.D.   On: 06/01/2015 23:11   Ct Angio Chest Pe W/cm &/or Wo Cm  06/02/2015  CLINICAL DATA:  Gradual onset of chills and dyspnea around 19:00 EXAM: CT ANGIOGRAPHY CHEST WITH CONTRAST TECHNIQUE: Multidetector CT imaging of the chest was performed using the standard protocol during bolus administration of intravenous contrast. Multiplanar CT image reconstructions and MIPs were obtained to evaluate the vascular anatomy. CONTRAST:  100 mL Isovue 370 intravenous COMPARISON:  No significant abnormality FINDINGS: Cardiovascular: There is good opacification of the pulmonary arteries. There  is no pulmonary embolism. The thoracic aorta is normal in caliber and intact. Lungs: Clear Central airways: Normal Effusions: None Lymphadenopathy: None Esophagus: Unremarkable Upper abdomen: No significant abnormality Musculoskeletal: No significant abnormality Review of the MIP images confirms the above findings. IMPRESSION: Negative for acute pulmonary embolism.  No significant abnormality. Electronically Signed   By: Andreas Newport M.D.   On: 06/02/2015 01:09   Dg Chest Port 1 View  06/04/2015  CLINICAL DATA:  Sob started last night and worsened today alone with some chest pain. EXAM: PORTABLE CHEST 1 VIEW COMPARISON:  06/02/2015 FINDINGS: Patient's left-sided AICD, lead to the right ventricle. Status post median sternotomy and CABG. Heart is enlarged. There are no focal consolidations or pleural effusions. No pulmonary edema. IMPRESSION: Cardiomegaly. Electronically Signed   By: Nolon Nations M.D.   On: 06/04/2015 14:30       Oren Binet, MD  [facility] Pager:[phone]  If 7PM-7AM, please contact night-coverage [email or web address] Password [email or web address] 06/05/2015, 10:19 AM

## 2015-06-05 NOTE — Care Management Obs Status (Signed)
[place] NOTIFICATION   Patient Details  Name: [name] MRN: [id number] Date of Birth: [birth year]   Medicare Observation Status Notification Given:  Yes    Briant Sites, RN 06/05/2015, 4:06 PM

## 2015-06-05 NOTE — Progress Notes (Signed)
Patient nebulizer treatment made prn , no wheezes noted.

## 2015-06-06 DIAGNOSIS — I214 Non-ST elevation (NSTEMI) myocardial infarction: Secondary | ICD-10-CM | POA: Diagnosis not present

## 2015-06-06 DIAGNOSIS — R0600 Dyspnea, unspecified: Secondary | ICD-10-CM | POA: Diagnosis not present

## 2015-06-06 LAB — BASIC METABOLIC PANEL
Anion gap: 3 — ABNORMAL LOW (ref 5–15)
BUN: 19 mg/dL (ref 6–20)
CHLORIDE: 109 mmol/L (ref 101–111)
CO2: 27 mmol/L (ref 22–32)
Calcium: 8.7 mg/dL — ABNORMAL LOW (ref 8.9–10.3)
Creatinine, Ser: 0.7 mg/dL (ref 0.61–1.24)
GFR calc Af Amer: >60 mL/min (ref >60)
GFR calc non Af Amer: >60 mL/min (ref >60)
Glucose, Bld: 104 mg/dL — ABNORMAL HIGH (ref 65–99)
POTASSIUM: 3.5 mmol/L (ref 3.5–5.1)
SODIUM: 139 mmol/L (ref 135–145)
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
## 2015-06-06 NOTE — Discharge Instructions (Signed)
Follow with Primary MD Purvis Kilts, MD in 3 days , follow results of the blood cultures.  Get CBC, CMP, 2 view Chest X ray checked  by Primary MD next visit.    Activity: As tolerated with Full fall precautions use walker/cane & assistance as needed   Disposition Home     Diet:   Heart Healthy  .  For Heart failure patients - Check your Weight same time everyday, if you gain over 2 pounds, or you develop in leg swelling, experience more shortness of breath or chest pain, call your Primary MD immediately. Follow Cardiac Low Salt Diet and 1.5 lit/day fluid restriction.   On your next visit with your primary care physician please Get Medicines reviewed and adjusted.   Please request your Prim.MD to go over all Hospital Tests and Procedure/Radiological results at the follow up, please get all Hospital records sent to your Prim MD by signing hospital release before you go home.   If you experience worsening of your admission symptoms, develop shortness of breath, life threatening emergency, suicidal or homicidal thoughts you must seek medical attention immediately by calling 911 or calling your MD immediately  if symptoms less severe.  You Must read complete instructions/literature along with all the possible adverse reactions/side effects for all the Medicines you take and that have been prescribed to you. Take any new Medicines after you have completely understood and accpet all the possible adverse reactions/side effects.   Do not drive, operating heavy machinery, perform activities at heights, swimming or participation in water activities or provide baby sitting services if your were admitted for syncope or siezures until you have seen by Primary MD or a Neurologist and advised to do so again.  Do not drive when taking Pain medications.    Do not take more than prescribed Pain, Sleep and Anxiety Medications  Special Instructions: If you have smoked or chewed Tobacco  in the  last 2 yrs please stop smoking, stop any regular Alcohol  and or any Recreational drug use.  Wear Seat belts while driving.   Please note  You were cared for by a hospitalist during your hospital stay. If you have any questions about your discharge medications or the care you received while you were in the hospital after you are discharged, you can call the unit and asked to speak with the hospitalist on call if the hospitalist that took care of you is not available. Once you are discharged, your primary care physician will handle any further medical issues. Please note that NO REFILLS for any discharge medications will be authorized once you are discharged, as it is imperative that you return to your primary care physician (or establish a relationship with a primary care physician if you do not have one) for your aftercare needs so that they can reassess your need for medications and monitor your lab values.

## 2015-06-06 NOTE — Progress Notes (Signed)
Pt IV and telemetry removed, tolerated well.  Reviewed discharge instructions with pt and answered questions at this time.  Will continue to monitor pt until leaves the floor.

## 2015-06-07 ENCOUNTER — Ambulatory Visit (INDEPENDENT_AMBULATORY_CARE_PROVIDER_SITE_OTHER): Payer: Medicare Other | Admitting: Cardiology

## 2015-06-07 ENCOUNTER — Encounter: Payer: Self-pay | Admitting: *Deleted

## 2015-06-07 ENCOUNTER — Encounter: Payer: Self-pay | Admitting: Internal Medicine

## 2015-06-07 ENCOUNTER — Encounter: Payer: Self-pay | Admitting: Cardiology

## 2015-06-07 VITALS — BP 116/68 | HR 56 | Ht 71.0 in | Wt 200.0 lb

## 2015-06-07 DIAGNOSIS — I10 Essential (primary) hypertension: Secondary | ICD-10-CM

## 2015-06-07 DIAGNOSIS — I350 Nonrheumatic aortic (valve) stenosis: Secondary | ICD-10-CM

## 2015-06-07 DIAGNOSIS — R079 Chest pain, unspecified: Secondary | ICD-10-CM

## 2015-06-07 DIAGNOSIS — E782 Mixed hyperlipidemia: Secondary | ICD-10-CM

## 2015-06-07 DIAGNOSIS — I5022 Chronic systolic (congestive) heart failure: Secondary | ICD-10-CM

## 2015-06-07 DIAGNOSIS — I2510 Atherosclerotic heart disease of native coronary artery without angina pectoris: Secondary | ICD-10-CM | POA: Diagnosis not present

## 2015-06-07 MED ORDER — NITROGLYCERIN 0.4 MG SL SUBL: 0.4000 mg | SUBLINGUAL_TABLET | SUBLINGUAL | Status: DC | PRN
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
## 2015-06-07 NOTE — Patient Instructions (Addendum)
Your physician recommends that you schedule a follow-up appointment in: 2 Months with Dr. Harl Bowie  Your physician recommends that you continue on your current medications as directed. Please refer to the Current Medication list given to you today.  Your physician has requested that you have en exercise stress myoview. For further information please visit [email or web address]. Please follow instruction sheet, as given. (Do not take your Coreg on this day)  If you need a refill on your cardiac medications before your next appointment, please call your pharmacy.  Thank you for choosing [facility]!

## 2015-06-08 LAB — URINE CULTURE

## 2015-06-09 DIAGNOSIS — R0902 Hypoxemia: Secondary | ICD-10-CM | POA: Diagnosis not present

## 2015-06-09 DIAGNOSIS — I213 ST elevation (STEMI) myocardial infarction of unspecified site: Secondary | ICD-10-CM | POA: Diagnosis not present

## 2015-06-09 DIAGNOSIS — E663 Overweight: Secondary | ICD-10-CM | POA: Diagnosis not present

## 2015-06-09 DIAGNOSIS — Z1389 Encounter for screening for other disorder: Secondary | ICD-10-CM | POA: Diagnosis not present

## 2015-06-09 DIAGNOSIS — Z6827 Body mass index (BMI) 27.0-27.9, adult: Secondary | ICD-10-CM | POA: Diagnosis not present

## 2015-06-10 ENCOUNTER — Encounter (HOSPITAL_COMMUNITY)
Admission: RE | Admit: 2015-06-10 | Discharge: 2015-06-10 | Disposition: A | Discharge status: Home / Self Care | Payer: Medicare Other | Source: Ambulatory Visit | Attending: Cardiology | Admitting: Cardiology

## 2015-06-10 ENCOUNTER — Inpatient Hospital Stay (HOSPITAL_COMMUNITY): Admission: RE | Admit: 2015-06-10 | Payer: Medicare Other | Source: Ambulatory Visit

## 2015-06-10 ENCOUNTER — Encounter (HOSPITAL_COMMUNITY): Payer: Self-pay

## 2015-06-10 DIAGNOSIS — R931 Abnormal findings on diagnostic imaging of heart and coronary circulation: Secondary | ICD-10-CM | POA: Insufficient documentation

## 2015-06-10 DIAGNOSIS — I510 Cardiac septal defect, acquired: Secondary | ICD-10-CM | POA: Diagnosis not present

## 2015-06-10 DIAGNOSIS — R079 Chest pain, unspecified: Secondary | ICD-10-CM | POA: Diagnosis present

## 2015-06-10 LAB — NM MYOCAR MULTI W/SPECT W/WALL MOTION / EF
CHL CUP NUCLEAR SRS: 34
CHL CUP RESTING HR STRESS: 55 {beats}/min
CHL RATE OF PERCEIVED EXERTION: 15
CSEPED: 6 min
CSEPEW: 7 METS
CSEPPHR: 136 {beats}/min
Exercise duration (sec): 12 s
LVDIAVOL: 166 mL (ref 62–150)
LVSYSVOL: 119 mL
MPHR: 149 {beats}/min
NUC STRESS TID: 0.89
Percent HR: 91 %
RATE: 0.2
SDS: 4
SSS: 38

## 2015-06-10 LAB — CULTURE, BLOOD (ROUTINE X 2)
CULTURE: NO GROWTH
Culture: NO GROWTH

## 2015-06-10 MED ORDER — REGADENOSON 0.4 MG/5ML IV SOLN
INTRAVENOUS | Status: AC
  Filled 2015-06-10: qty 5

## 2015-06-10 MED ORDER — SODIUM CHLORIDE 0.9% FLUSH
INTRAVENOUS | Status: AC
  Administered 2015-06-10: 10 mL via INTRAVENOUS
  Filled 2015-06-10: qty 10

## 2015-06-10 MED ORDER — TECHNETIUM TC 99M TETROFOSMIN IV KIT
30.0000 | PACK | Freq: Once | INTRAVENOUS | Status: AC | PRN
  Administered 2015-06-10: 30 via INTRAVENOUS

## 2015-06-10 MED ORDER — TECHNETIUM TC 99M TETROFOSMIN IV KIT
10.0000 | PACK | Freq: Once | INTRAVENOUS | Status: AC | PRN
  Administered 2015-06-10: 10.5 via INTRAVENOUS

## 2015-06-11 ENCOUNTER — Telehealth: Payer: Self-pay

## 2015-06-11 NOTE — Telephone Encounter (Signed)
Pt wants to know if he is able to go back to work yet. 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 He has no problems. PLease advise.

## 2015-06-14 NOTE — Telephone Encounter (Signed)
Yes, ok to go back to work   Zandra Abts MD

## 2015-06-14 NOTE — Telephone Encounter (Signed)
Let pt know he could return to work. He voiced understanding.

## 2015-06-30 ENCOUNTER — Encounter: Payer: Self-pay | Admitting: Cardiology

## 2015-07-05 ENCOUNTER — Ambulatory Visit (INDEPENDENT_AMBULATORY_CARE_PROVIDER_SITE_OTHER): Payer: Medicare Other | Admitting: *Deleted

## 2015-07-05 ENCOUNTER — Telehealth: Payer: Self-pay | Admitting: Cardiology

## 2015-07-05 ENCOUNTER — Telehealth: Payer: Self-pay | Admitting: Internal Medicine

## 2015-07-05 DIAGNOSIS — Z95810 Presence of automatic (implantable) cardiac defibrillator: Secondary | ICD-10-CM

## 2015-07-05 NOTE — Progress Notes (Signed)
Remote ICD transmission.   

## 2015-07-05 NOTE — Telephone Encounter (Signed)
LM on personal VM that transmission was received today and to call back to device clinic if he has further questions.  Patient's ICD is ERI as of 06/30/15- will forward to Lorenda Hatchet to arrange f/u with GT.

## 2015-07-05 NOTE — Telephone Encounter (Signed)
New message   4. Are you calling to see if we received your device transmission? Yes

## 2015-07-05 NOTE — Telephone Encounter (Signed)
Spoke with pt and reminded pt of remote transmission that is due today. Pt verbalized understanding.   

## 2015-07-07 LAB — CUP PACEART REMOTE DEVICE CHECK
Battery Voltage: 2.64 V
Date Time Interrogation Session: 20170511144900
Date Time Interrogation Session: 20170612154200
HighPow Impedance: 53 Ohm
HighPow Impedance: 53 Ohm
Implantable Lead Implant Date: 20080624
Implantable Lead Model: 6947
Implantable Lead Model: 6947
Lead Channel Impedance Value: 496 Ohm
Lead Channel Sensing Intrinsic Amplitude: 4.7 mV
Lead Channel Sensing Intrinsic Amplitude: 4.5 mV
Lead Channel Setting Pacing Amplitude: 2 V
Lead Channel Setting Pacing Pulse Width: 0.4 ms
Lead Channel Setting Sensing Sensitivity: 0.9 mV
MDC IDC LEAD IMPLANT DT: 20080624
MDC IDC LEAD LOCATION: 753860
MDC IDC LEAD LOCATION: 753860
MDC IDC MSMT BATTERY VOLTAGE: 2.62 V
MDC IDC MSMT LEADCHNL RV IMPEDANCE VALUE: 504 Ohm
MDC IDC SET LEADCHNL RV PACING AMPLITUDE: 2 V
MDC IDC SET LEADCHNL RV PACING PULSEWIDTH: 0.4 ms
MDC IDC SET LEADCHNL RV SENSING SENSITIVITY: 0.9 mV
MDC IDC STAT BRADY RV PERCENT PACED: 0 %
MDC IDC STAT BRADY RV PERCENT PACED: 0 %

## 2015-07-08 DIAGNOSIS — R0902 Hypoxemia: Secondary | ICD-10-CM | POA: Diagnosis not present

## 2015-07-15 ENCOUNTER — Encounter: Payer: Self-pay | Admitting: Cardiology

## 2015-07-16 ENCOUNTER — Encounter: Payer: Self-pay | Admitting: Cardiology

## 2015-07-23 ENCOUNTER — Ambulatory Visit (INDEPENDENT_AMBULATORY_CARE_PROVIDER_SITE_OTHER): Payer: Medicare Other | Admitting: Internal Medicine

## 2015-07-23 ENCOUNTER — Encounter: Payer: Self-pay | Admitting: Internal Medicine

## 2015-07-23 ENCOUNTER — Encounter: Payer: Self-pay | Admitting: *Deleted

## 2015-07-23 VITALS — BP 118/66 | HR 55 | Ht 71.0 in | Wt 198.0 lb

## 2015-07-23 DIAGNOSIS — I255 Ischemic cardiomyopathy: Secondary | ICD-10-CM | POA: Diagnosis not present

## 2015-07-23 NOTE — Patient Instructions (Signed)
Your physician has recommended that you have a defibrillator inserted on 08/06/15. An implantable cardioverter defibrillator (ICD) is a small device that is placed in your chest or, in rare cases, your abdomen. This device uses electrical pulses or shocks to help control life-threatening, irregular heartbeats that could lead the heart to suddenly stop beating (sudden cardiac arrest). Leads are attached to the ICD that goes into your heart. This is done in the hospital and usually requires an overnight stay. Please see the instruction sheet given to you today for more information.  Your physician recommends that you continue on your current medications as directed. Please refer to the Current Medication list given to you today.  If you need a refill on your cardiac medications before your next appointment, please call your pharmacy.  Thank you for choosing [facility]!
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
## 2015-07-30 LAB — CUP PACEART INCLINIC DEVICE CHECK
Implantable Lead Implant Date: 20080624
MDC IDC LEAD LOCATION: 753860
MDC IDC SESS DTM: 20170707103315

## 2015-08-02 DIAGNOSIS — Z6828 Body mass index (BMI) 28.0-28.9, adult: Secondary | ICD-10-CM | POA: Diagnosis not present

## 2015-08-02 DIAGNOSIS — I2510 Atherosclerotic heart disease of native coronary artery without angina pectoris: Secondary | ICD-10-CM | POA: Diagnosis not present

## 2015-08-02 DIAGNOSIS — R0902 Hypoxemia: Secondary | ICD-10-CM | POA: Diagnosis not present

## 2015-08-02 DIAGNOSIS — I255 Ischemic cardiomyopathy: Secondary | ICD-10-CM | POA: Diagnosis not present

## 2015-08-02 DIAGNOSIS — L42 Pityriasis rosea: Secondary | ICD-10-CM | POA: Diagnosis not present

## 2015-08-02 LAB — CBC WITH DIFFERENTIAL/PLATELET
Basophils Absolute: 0 cells/uL (ref 0–200)
Basophils Relative: 0 %
EOS PCT: 4 %
Eosinophils Absolute: 228 cells/uL (ref 15–500)
HCT: 41.5 % (ref 38.5–50.0)
HEMOGLOBIN: 14.1 g/dL (ref 13.2–17.1)
LYMPHS ABS: 1254 {cells}/uL (ref 850–3900)
Lymphocytes Relative: 22 %
MCH: 31.3 pg (ref 27.0–33.0)
MCHC: 34 g/dL (ref 32.0–36.0)
MCV: 92.2 fL (ref 80.0–100.0)
MONOS PCT: 9 %
MPV: 10.2 fL (ref 7.5–12.5)
Monocytes Absolute: 513 cells/uL (ref 200–950)
NEUTROS ABS: 3705 {cells}/uL (ref 1500–7800)
Neutrophils Relative %: 65 %
PLATELETS: 187 10*3/uL (ref 140–400)
RBC: 4.5 MIL/uL (ref 4.20–5.80)
RDW: 13.6 % (ref 11.0–15.0)
WBC: 5.7 10*3/uL (ref 3.8–10.8)

## 2015-08-02 LAB — BASIC METABOLIC PANEL
BUN: 16 mg/dL (ref 7–25)
CHLORIDE: 105 mmol/L (ref 98–110)
CO2: 28 mmol/L (ref 20–31)
Calcium: 8.6 mg/dL (ref 8.6–10.3)
Creat: 0.82 mg/dL (ref 0.70–1.18)
GLUCOSE: 109 mg/dL — AB (ref 65–99)
POTASSIUM: 4.2 mmol/L (ref 3.5–5.3)
SODIUM: 138 mmol/L (ref 135–146)

## 2015-08-02 LAB — PROTIME-INR
INR: 1
Prothrombin Time: 10.6 s (ref 9.0–11.5)

## 2015-08-06 ENCOUNTER — Encounter (HOSPITAL_COMMUNITY)
Admission: RE | Disposition: A | Discharge status: Home / Self Care | Payer: Self-pay | Source: Ambulatory Visit | Attending: Internal Medicine

## 2015-08-06 ENCOUNTER — Ambulatory Visit (HOSPITAL_COMMUNITY)
Admission: RE | Admit: 2015-08-06 | Discharge: 2015-08-06 | Disposition: A | Discharge status: Home / Self Care | Payer: Medicare Other | Source: Ambulatory Visit | Attending: Internal Medicine | Admitting: Internal Medicine

## 2015-08-06 ENCOUNTER — Encounter (HOSPITAL_COMMUNITY): Payer: Self-pay | Admitting: *Deleted

## 2015-08-06 DIAGNOSIS — Z953 Presence of xenogenic heart valve: Secondary | ICD-10-CM | POA: Insufficient documentation

## 2015-08-06 DIAGNOSIS — I5022 Chronic systolic (congestive) heart failure: Secondary | ICD-10-CM | POA: Insufficient documentation

## 2015-08-06 DIAGNOSIS — Z4502 Encounter for adjustment and management of automatic implantable cardiac defibrillator: Secondary | ICD-10-CM | POA: Insufficient documentation

## 2015-08-06 DIAGNOSIS — Z955 Presence of coronary angioplasty implant and graft: Secondary | ICD-10-CM | POA: Diagnosis not present

## 2015-08-06 DIAGNOSIS — Z951 Presence of aortocoronary bypass graft: Secondary | ICD-10-CM | POA: Diagnosis not present

## 2015-08-06 DIAGNOSIS — I252 Old myocardial infarction: Secondary | ICD-10-CM | POA: Insufficient documentation

## 2015-08-06 DIAGNOSIS — Z87891 Personal history of nicotine dependence: Secondary | ICD-10-CM | POA: Insufficient documentation

## 2015-08-06 DIAGNOSIS — I110 Hypertensive heart disease with heart failure: Secondary | ICD-10-CM | POA: Insufficient documentation

## 2015-08-06 DIAGNOSIS — I472 Ventricular tachycardia: Secondary | ICD-10-CM | POA: Diagnosis not present

## 2015-08-06 DIAGNOSIS — I255 Ischemic cardiomyopathy: Secondary | ICD-10-CM | POA: Diagnosis not present

## 2015-08-06 DIAGNOSIS — Z7982 Long term (current) use of aspirin: Secondary | ICD-10-CM | POA: Insufficient documentation

## 2015-08-06 DIAGNOSIS — Z95810 Presence of automatic (implantable) cardiac defibrillator: Secondary | ICD-10-CM | POA: Diagnosis present

## 2015-08-06 DIAGNOSIS — E785 Hyperlipidemia, unspecified: Secondary | ICD-10-CM | POA: Insufficient documentation

## 2015-08-06 HISTORY — PX: EP IMPLANTABLE DEVICE: SHX172B

## 2015-08-06 LAB — SURGICAL PCR SCREEN
MRSA, PCR: NEGATIVE
Staphylococcus aureus: NEGATIVE

## 2015-08-06 SURGERY — ICD/BIV ICD GENERATOR CHANGEOUT
Anesthesia: LOCAL

## 2015-08-06 MED ORDER — ONDANSETRON HCL 4 MG/2ML IJ SOLN: 4.0000 mg | Freq: Four times a day (QID) | INTRAMUSCULAR | Status: DC | PRN

## 2015-08-06 MED ORDER — MIDAZOLAM HCL 5 MG/5ML IJ SOLN
INTRAMUSCULAR | Status: AC
  Filled 2015-08-06: qty 5

## 2015-08-06 MED ORDER — LIDOCAINE HCL (PF) 1 % IJ SOLN
INTRAMUSCULAR | Status: AC
  Filled 2015-08-06: qty 30

## 2015-08-06 MED ORDER — SODIUM CHLORIDE 0.9 % IR SOLN
Status: AC
  Filled 2015-08-06: qty 2

## 2015-08-06 MED ORDER — SODIUM CHLORIDE 0.9 % IV SOLN
INTRAVENOUS | Status: DC
  Administered 2015-08-06: 10:00:00 via INTRAVENOUS

## 2015-08-06 MED ORDER — CHLORHEXIDINE GLUCONATE 4 % EX LIQD: 60.0000 mL | Freq: Once | CUTANEOUS | Status: DC

## 2015-08-06 MED ORDER — LIDOCAINE HCL (PF) 1 % IJ SOLN
INTRAMUSCULAR | Status: DC | PRN
  Administered 2015-08-06: 31 mL via INTRADERMAL

## 2015-08-06 MED ORDER — MUPIROCIN 2 % EX OINT: 1.0000 "application " | TOPICAL_OINTMENT | Freq: Once | CUTANEOUS | Status: DC

## 2015-08-06 MED ORDER — FENTANYL CITRATE (PF) 100 MCG/2ML IJ SOLN
INTRAMUSCULAR | Status: AC
  Filled 2015-08-06: qty 2

## 2015-08-06 MED ORDER — ACETAMINOPHEN 325 MG PO TABS: 325.0000 mg | ORAL_TABLET | ORAL | Status: DC | PRN

## 2015-08-06 MED ORDER — FENTANYL CITRATE (PF) 100 MCG/2ML IJ SOLN
INTRAMUSCULAR | Status: DC | PRN
  Administered 2015-08-06 (×3): 25 ug via INTRAVENOUS

## 2015-08-06 MED ORDER — VANCOMYCIN HCL IN DEXTROSE 1-5 GM/200ML-% IV SOLN
INTRAVENOUS | Status: AC
  Filled 2015-08-06: qty 200

## 2015-08-06 MED ORDER — MUPIROCIN 2 % EX OINT
TOPICAL_OINTMENT | CUTANEOUS | Status: AC
  Administered 2015-08-06: 1 via NASAL
  Filled 2015-08-06: qty 22

## 2015-08-06 MED ORDER — SODIUM CHLORIDE 0.9 % IR SOLN
80.0000 mg | Status: AC
  Administered 2015-08-06: 80 mg

## 2015-08-06 MED ORDER — VANCOMYCIN HCL IN DEXTROSE 1-5 GM/200ML-% IV SOLN
1000.0000 mg | INTRAVENOUS | Status: AC
  Administered 2015-08-06: 1000 mg via INTRAVENOUS

## 2015-08-06 MED ORDER — MIDAZOLAM HCL 5 MG/5ML IJ SOLN
INTRAMUSCULAR | Status: DC | PRN
  Administered 2015-08-06: 1 mg via INTRAVENOUS
  Administered 2015-08-06 (×2): 2 mg via INTRAVENOUS

## 2015-08-06 SURGICAL SUPPLY — 4 items
CABLE SURGICAL S-101-97-12 (CABLE) ×1 IMPLANT
ICD VISIA MRI DVFB1D1 (ICD Generator) ×1 IMPLANT
PAD DEFIB LIFELINK (PAD) ×1 IMPLANT
TRAY PACEMAKER INSERTION (PACKS) ×1 IMPLANT

## 2015-08-06 NOTE — H&P (Signed)
  ICD Criteria  Current LVEF:25%. Within 12 months prior to implant: Yes   Heart failure history: Yes, Class II  Cardiomyopathy history: Yes, Ischemic Cardiomyopathy.  Atrial Fibrillation/Atrial Flutter: No.  Ventricular tachycardia history: No.  Cardiac arrest history: No.  History of syndromes with risk of sudden death: No.  Previous ICD: Yes, Reason for ICD:  Primary prevention.  Current ICD indication: Primary  PPM indication: No.   Class I or II Bradycardia indication present: No  Beta Blocker therapy for 3 or more months: Yes, prescribed.   Ace Inhibitor/ARB therapy for 3 or more months: Yes, prescribed.

## 2015-08-06 NOTE — Interval H&P Note (Signed)
History and Physical Interval Note:  08/06/2015 11:37 AM  [name]  has presented today for surgery, with the diagnosis of ERI  The various methods of treatment have been discussed with the patient and family. After consideration of risks, benefits and other options for treatment, the patient has consented to  Procedure(s): ICD [employer] (N/A) as a surgical intervention .  The patient's history has been reviewed, patient examined, no change in status, stable for surgery.  I have reviewed the patient's chart and labs.  Questions were answered to the patient's satisfaction.     [name]

## 2015-08-06 NOTE — Discharge Instructions (Signed)
Pacemaker Battery Change, Care After Refer to this sheet in the next few weeks. These instructions provide you with information on caring for yourself after your procedure. Your health care provider may also give you more specific instructions. Your treatment has been planned according to current medical practices, but problems sometimes occur. Call your health care provider if you have any problems or questions after your procedure. WHAT TO EXPECT AFTER THE PROCEDURE After your procedure, it is typical to have the following sensations:  Soreness at the pacemaker site. HOME CARE INSTRUCTIONS   Keep the incision clean and dry.  Remove outer dressing tomorrow; do not get site wet for one week;  Leave steri-strips in place they will fall off do not pull them off  For the first week after the replacement, avoid stretching motions that pull at the incision site, and avoid heavy exercise with the arm that is on the same side as the incision.  Take medicines only as directed by your health care provider.  Keep all follow-up visits as directed by your health care provider. SEEK MEDICAL CARE IF:   You have pain at the incision site that is not relieved by over-the-counter or prescription medicine.  There is drainage or pus from the incision site.  There is swelling larger than a lime at the incision site.  You develop red streaking that extends above or below the incision site.  You feel brief, intermittent palpitations, light-headedness, or any symptoms that you feel might be related to your heart. SEEK IMMEDIATE MEDICAL CARE IF:   You experience chest pain that is different than the pain at the pacemaker site.  You experience shortness of breath.  You have palpitations or irregular heartbeat.  You have light-headedness that does not go away quickly.  You faint.  You have pain that gets worse and is not relieved by medicine.   This information is not intended to replace advice given  to you by your health care provider. Make sure you discuss any questions you have with your health care provider.   Document Released: 10/30/2012 Document Revised: 01/30/2014 Document Reviewed: 10/30/2012 Elsevier Interactive Patient Education [employer].

## 2015-08-06 NOTE — Interval H&P Note (Signed)
History and Physical Interval Note:  08/06/2015 10:03 AM  [name]  has presented today for surgery, with the diagnosis of ERI  The various methods of treatment have been discussed with the patient and family. After consideration of risks, benefits and other options for treatment, the patient has consented to  Procedure(s): ICD [employer] (N/A) as a surgical intervention .  The patient's history has been reviewed, patient examined, no change in status, stable for surgery.  I have reviewed the patient's chart and labs.  Questions were answered to the patient's satisfaction.     Cristopher Peru
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
## 2015-08-10 ENCOUNTER — Telehealth: Payer: Self-pay | Admitting: Internal Medicine

## 2015-08-10 ENCOUNTER — Ambulatory Visit: Payer: Medicare Other | Admitting: Cardiology

## 2015-08-10 NOTE — Telephone Encounter (Signed)
Spoke with patient regarding returning to work on Saturday for a 4hr shift post generator change out. Patient explained that he worked as a Scientist, water quality at [employer], I explained to patient that he should be safe to return to work and confirmed his wound check appointment for 7/24. I additionally answered questions regarding his home monitor and informed patient that he was safe to drive at this time.

## 2015-08-10 NOTE — Telephone Encounter (Signed)
New Message  Pt wanted to know if he can return to light work before [employer] sched for 7/24. Please call back and discuss.

## 2015-08-12 ENCOUNTER — Encounter: Payer: Self-pay | Admitting: Cardiology

## 2015-08-12 ENCOUNTER — Ambulatory Visit (INDEPENDENT_AMBULATORY_CARE_PROVIDER_SITE_OTHER): Payer: Medicare Other | Admitting: Cardiology

## 2015-08-12 VITALS — BP 108/62 | HR 52 | Ht 70.0 in | Wt 201.0 lb

## 2015-08-12 DIAGNOSIS — I255 Ischemic cardiomyopathy: Secondary | ICD-10-CM | POA: Diagnosis not present

## 2015-08-12 DIAGNOSIS — E782 Mixed hyperlipidemia: Secondary | ICD-10-CM | POA: Diagnosis not present

## 2015-08-12 DIAGNOSIS — I10 Essential (primary) hypertension: Secondary | ICD-10-CM

## 2015-08-12 DIAGNOSIS — I350 Nonrheumatic aortic (valve) stenosis: Secondary | ICD-10-CM

## 2015-08-12 DIAGNOSIS — I5022 Chronic systolic (congestive) heart failure: Secondary | ICD-10-CM

## 2015-08-12 DIAGNOSIS — I2510 Atherosclerotic heart disease of native coronary artery without angina pectoris: Secondary | ICD-10-CM | POA: Diagnosis not present

## 2015-08-12 NOTE — Patient Instructions (Signed)
Medication Instructions:  Your physician recommends that you continue on your current medications as directed. Please refer to the Current Medication list given to you today.   Labwork: NONE  Testing/Procedures: NONE  Follow-Up: Your physician wants you to follow-up in: 4 MONTHS .  You will receive a reminder letter in the mail two months in advance. If you don't receive a letter, please call our office to schedule the follow-up appointment.   Any Other Special Instructions Will Be Listed Below (If Applicable).     If you need a refill on your cardiac medications before your next appointment, please call your pharmacy.   
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
## 2015-08-16 ENCOUNTER — Ambulatory Visit (INDEPENDENT_AMBULATORY_CARE_PROVIDER_SITE_OTHER): Payer: Medicare Other | Admitting: *Deleted

## 2015-08-16 ENCOUNTER — Encounter: Payer: Self-pay | Admitting: Internal Medicine

## 2015-08-16 DIAGNOSIS — Z95810 Presence of automatic (implantable) cardiac defibrillator: Secondary | ICD-10-CM | POA: Diagnosis not present

## 2015-08-16 DIAGNOSIS — I255 Ischemic cardiomyopathy: Secondary | ICD-10-CM

## 2015-08-16 LAB — CUP PACEART INCLINIC DEVICE CHECK
Battery Remaining Longevity: 136 mo
Battery Voltage: 3.15 V
Date Time Interrogation Session: 20170724181829
HIGH POWER IMPEDANCE MEASURED VALUE: 43 Ohm
HIGH POWER IMPEDANCE MEASURED VALUE: 60 Ohm
Lead Channel Impedance Value: 456 Ohm
Lead Channel Sensing Intrinsic Amplitude: 4 mV
Lead Channel Setting Pacing Pulse Width: 0.4 ms
MDC IDC LEAD IMPLANT DT: 20080624
MDC IDC LEAD LOCATION: 753860
MDC IDC MSMT LEADCHNL RV IMPEDANCE VALUE: 532 Ohm
MDC IDC MSMT LEADCHNL RV PACING THRESHOLD AMPLITUDE: 0.5 V
MDC IDC MSMT LEADCHNL RV PACING THRESHOLD PULSEWIDTH: 0.4 ms
MDC IDC SET LEADCHNL RV PACING AMPLITUDE: 2.5 V
MDC IDC SET LEADCHNL RV SENSING SENSITIVITY: 0.9 mV
MDC IDC STAT BRADY RV PERCENT PACED: 0.07 %

## 2015-08-16 NOTE — Patient Instructions (Addendum)
Call the [facility] at [phone] with the serial number (SN) from the back of your Carelink monitor.  This number will likely start with "[id number]...".  We will order you a return kit for your old monitor.  You will receive a letter in the mail to schedule a follow-up appointment with Dr. Lovena Le in 3 months.  Call our office if you don't receive this letter by the end of September.

## 2015-08-16 NOTE — Progress Notes (Signed)
Wound check appointment, s/p generator change on 08/06/15. Steri-strips removed. Wound without redness or edema. Incision edges approximated, wound well healed. Normal device function. Thresholds, sensing, and impedances consistent with implant measurements. Device programmed at chronic output. Histogram distribution appropriate for patient and level of activity. No AF or ventricular arrhythmias noted. Patient educated about wound care, arm mobility, shock plan. Patient to call with monitor SN to update in Carelink. ROV in 3 months with GT/R (recall entered).

## 2015-08-17 ENCOUNTER — Telehealth: Payer: Self-pay | Admitting: *Deleted

## 2015-08-17 NOTE — Telephone Encounter (Signed)
Called patient to make him aware that his Carelink home monitor is working.  Patient verbalizes understanding.  Informed patient that I will call [employer] and order a return kit for his old [id number] home monitor and that it should arrive within 7-10 business days.  Patient verbalizes understanding and denies questions or concerns at this time.

## 2015-08-17 NOTE — Telephone Encounter (Signed)
Patient called to verbalize appreciation for having a number to call regarding his device and his wife's device.  He also wanted to know if it is ok if his home monitor or his wife's home monitor lights up "every so often".  Advised that this typically indicates that the monitor is receiving a software update and that it is not cause for alarm.  Patient is appreciative and denies additional questions or concerns at this time.

## 2015-08-18 DIAGNOSIS — Z719 Counseling, unspecified: Secondary | ICD-10-CM | POA: Diagnosis not present

## 2015-08-18 DIAGNOSIS — R0902 Hypoxemia: Secondary | ICD-10-CM | POA: Diagnosis not present

## 2015-08-19 DIAGNOSIS — G4730 Sleep apnea, unspecified: Secondary | ICD-10-CM | POA: Diagnosis not present

## 2015-08-20 DIAGNOSIS — G4730 Sleep apnea, unspecified: Secondary | ICD-10-CM | POA: Diagnosis not present

## 2015-08-21 DIAGNOSIS — G4730 Sleep apnea, unspecified: Secondary | ICD-10-CM | POA: Diagnosis not present

## 2015-08-24 DIAGNOSIS — M5489 Other dorsalgia: Secondary | ICD-10-CM | POA: Diagnosis not present

## 2015-08-24 DIAGNOSIS — M5126 Other intervertebral disc displacement, lumbar region: Secondary | ICD-10-CM | POA: Diagnosis not present

## 2015-09-16 ENCOUNTER — Encounter: Payer: Medicare Other | Admitting: Internal Medicine

## 2015-10-05 ENCOUNTER — Other Ambulatory Visit: Payer: Self-pay | Admitting: Cardiology

## 2015-10-05 DIAGNOSIS — R7309 Other abnormal glucose: Secondary | ICD-10-CM | POA: Diagnosis not present

## 2015-10-05 DIAGNOSIS — Z6827 Body mass index (BMI) 27.0-27.9, adult: Secondary | ICD-10-CM | POA: Diagnosis not present

## 2015-10-05 DIAGNOSIS — G4733 Obstructive sleep apnea (adult) (pediatric): Secondary | ICD-10-CM | POA: Diagnosis not present

## 2015-10-05 DIAGNOSIS — I714 Abdominal aortic aneurysm, without rupture: Secondary | ICD-10-CM | POA: Diagnosis not present

## 2015-11-09 DIAGNOSIS — L570 Actinic keratosis: Secondary | ICD-10-CM | POA: Diagnosis not present

## 2015-11-09 DIAGNOSIS — Z85828 Personal history of other malignant neoplasm of skin: Secondary | ICD-10-CM | POA: Diagnosis not present

## 2015-11-25 ENCOUNTER — Encounter: Payer: Medicare Other | Admitting: *Deleted

## 2015-11-25 ENCOUNTER — Encounter: Payer: Self-pay | Admitting: Internal Medicine

## 2015-11-25 ENCOUNTER — Ambulatory Visit (INDEPENDENT_AMBULATORY_CARE_PROVIDER_SITE_OTHER): Payer: Medicare Other | Admitting: Internal Medicine

## 2015-11-25 DIAGNOSIS — I5022 Chronic systolic (congestive) heart failure: Secondary | ICD-10-CM

## 2015-11-25 DIAGNOSIS — Z23 Encounter for immunization: Secondary | ICD-10-CM | POA: Diagnosis not present

## 2015-11-25 DIAGNOSIS — I255 Ischemic cardiomyopathy: Secondary | ICD-10-CM | POA: Diagnosis not present

## 2015-11-25 LAB — CUP PACEART INCLINIC DEVICE CHECK
Battery Remaining Longevity: 135 mo
Date Time Interrogation Session: 20171102122634
HIGH POWER IMPEDANCE MEASURED VALUE: 47 Ohm
HIGH POWER IMPEDANCE MEASURED VALUE: 61 Ohm
Implantable Lead Implant Date: 20080624
Implantable Lead Location: 753860
Lead Channel Pacing Threshold Amplitude: 0.375 V
Lead Channel Pacing Threshold Pulse Width: 0.4 ms
Lead Channel Sensing Intrinsic Amplitude: 4.25 mV
Lead Channel Sensing Intrinsic Amplitude: 4.5 mV
Lead Channel Setting Pacing Amplitude: 2.5 V
MDC IDC MSMT BATTERY VOLTAGE: 3.13 V
MDC IDC MSMT LEADCHNL RV IMPEDANCE VALUE: 456 Ohm
MDC IDC MSMT LEADCHNL RV IMPEDANCE VALUE: 532 Ohm
MDC IDC PG IMPLANT DT: 20170714
MDC IDC SET LEADCHNL RV PACING PULSEWIDTH: 0.4 ms
MDC IDC SET LEADCHNL RV SENSING SENSITIVITY: 0.9 mV
MDC IDC STAT BRADY RV PERCENT PACED: 0.11 %

## 2015-11-25 NOTE — Patient Instructions (Addendum)
Your physician wants you to follow-up in 12 Months with Dr. Lovena Le. You will receive a reminder letter in the mail two months in advance. If you don't receive a letter, please call our office to schedule the follow-up appointment.  Remote monitoring is used to monitor your Pacemaker of ICD from home. This monitoring reduces the number of office visits required to check your device to one time per year. It allows [place] to keep an eye on the functioning of your device to ensure it is working properly. You are scheduled for a device check from home on 02/24/16. You may send your transmission at any time that day. If you have a wireless device, the transmission will be sent automatically. After your physician reviews your transmission, you will receive a postcard with your next transmission date.  Your physician recommends that you continue on your current medications as directed. Please refer to the Current Medication list given to you today.  If you need a refill on your cardiac medications before your next appointment, please call your pharmacy.  Thank you for choosing [facility]!
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
## 2015-12-27 ENCOUNTER — Encounter: Payer: Self-pay | Admitting: Cardiology

## 2015-12-27 ENCOUNTER — Ambulatory Visit (INDEPENDENT_AMBULATORY_CARE_PROVIDER_SITE_OTHER): Payer: Medicare Other | Admitting: Cardiology

## 2015-12-27 ENCOUNTER — Encounter: Payer: Self-pay | Admitting: *Deleted

## 2015-12-27 VITALS — BP 104/60 | HR 47 | Ht 71.0 in | Wt 199.0 lb

## 2015-12-27 DIAGNOSIS — I255 Ischemic cardiomyopathy: Secondary | ICD-10-CM

## 2015-12-27 DIAGNOSIS — I350 Nonrheumatic aortic (valve) stenosis: Secondary | ICD-10-CM

## 2015-12-27 DIAGNOSIS — I5022 Chronic systolic (congestive) heart failure: Secondary | ICD-10-CM | POA: Diagnosis not present

## 2015-12-27 DIAGNOSIS — E782 Mixed hyperlipidemia: Secondary | ICD-10-CM | POA: Diagnosis not present

## 2015-12-27 DIAGNOSIS — I10 Essential (primary) hypertension: Secondary | ICD-10-CM | POA: Diagnosis not present
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
## 2015-12-27 NOTE — Patient Instructions (Signed)
Your physician wants you to follow-up in: 6 Months with Dr. Branch. You will receive a reminder letter in the mail two months in advance. If you don't receive a letter, please call our office to schedule the follow-up appointment.  Your physician recommends that you continue on your current medications as directed. Please refer to the Current Medication list given to you today.  If you need a refill on your cardiac medications before your next appointment, please call your pharmacy.  Thank you for choosing [facility]!   

## 2016-02-24 DIAGNOSIS — J329 Chronic sinusitis, unspecified: Secondary | ICD-10-CM | POA: Diagnosis not present

## 2016-02-24 DIAGNOSIS — Z6827 Body mass index (BMI) 27.0-27.9, adult: Secondary | ICD-10-CM | POA: Diagnosis not present

## 2016-02-24 DIAGNOSIS — I5022 Chronic systolic (congestive) heart failure: Secondary | ICD-10-CM | POA: Diagnosis not present

## 2016-02-24 DIAGNOSIS — J449 Chronic obstructive pulmonary disease, unspecified: Secondary | ICD-10-CM | POA: Diagnosis not present

## 2016-03-27 DIAGNOSIS — Z0000 Encounter for general adult medical examination without abnormal findings: Secondary | ICD-10-CM | POA: Diagnosis not present

## 2016-03-27 DIAGNOSIS — E663 Overweight: Secondary | ICD-10-CM | POA: Diagnosis not present

## 2016-03-27 DIAGNOSIS — Z1389 Encounter for screening for other disorder: Secondary | ICD-10-CM | POA: Diagnosis not present

## 2016-03-27 DIAGNOSIS — Z6827 Body mass index (BMI) 27.0-27.9, adult: Secondary | ICD-10-CM | POA: Diagnosis not present

## 2016-03-28 DIAGNOSIS — R05 Cough: Secondary | ICD-10-CM | POA: Diagnosis not present

## 2016-03-28 DIAGNOSIS — Z6827 Body mass index (BMI) 27.0-27.9, adult: Secondary | ICD-10-CM | POA: Diagnosis not present

## 2016-03-28 DIAGNOSIS — G4733 Obstructive sleep apnea (adult) (pediatric): Secondary | ICD-10-CM | POA: Diagnosis not present

## 2016-05-05 ENCOUNTER — Encounter: Payer: Self-pay | Admitting: Cardiology

## 2016-05-11 ENCOUNTER — Ambulatory Visit (INDEPENDENT_AMBULATORY_CARE_PROVIDER_SITE_OTHER): Payer: Medicare Other | Admitting: *Deleted

## 2016-05-11 DIAGNOSIS — I5022 Chronic systolic (congestive) heart failure: Secondary | ICD-10-CM | POA: Diagnosis not present

## 2016-05-11 DIAGNOSIS — I255 Ischemic cardiomyopathy: Secondary | ICD-10-CM

## 2016-05-11 DIAGNOSIS — M722 Plantar fascial fibromatosis: Secondary | ICD-10-CM | POA: Diagnosis not present

## 2016-05-11 DIAGNOSIS — M71571 Other bursitis, not elsewhere classified, right ankle and foot: Secondary | ICD-10-CM | POA: Diagnosis not present

## 2016-05-11 DIAGNOSIS — M7731 Calcaneal spur, right foot: Secondary | ICD-10-CM | POA: Diagnosis not present

## 2016-05-16 NOTE — Progress Notes (Signed)
Remote ICD transmission.   

## 2016-05-18 ENCOUNTER — Encounter: Payer: Self-pay | Admitting: Cardiology

## 2016-05-18 LAB — CUP PACEART REMOTE DEVICE CHECK
Battery Remaining Longevity: 133 mo
Date Time Interrogation Session: 20180419201051
HIGH POWER IMPEDANCE MEASURED VALUE: 62 Ohm
HighPow Impedance: 47 Ohm
Implantable Lead Implant Date: 20080624
Implantable Lead Location: 753860
Implantable Lead Model: 6947
Lead Channel Pacing Threshold Amplitude: 0.375 V
Lead Channel Pacing Threshold Pulse Width: 0.4 ms
Lead Channel Sensing Intrinsic Amplitude: 6.375 mV
Lead Channel Sensing Intrinsic Amplitude: 6.375 mV
Lead Channel Setting Pacing Amplitude: 2.5 V
MDC IDC MSMT BATTERY VOLTAGE: 3.02 V
MDC IDC MSMT LEADCHNL RV IMPEDANCE VALUE: 456 Ohm
MDC IDC MSMT LEADCHNL RV IMPEDANCE VALUE: 532 Ohm
MDC IDC PG IMPLANT DT: 20170714
MDC IDC SET LEADCHNL RV PACING PULSEWIDTH: 0.4 ms
MDC IDC SET LEADCHNL RV SENSING SENSITIVITY: 0.9 mV
MDC IDC STAT BRADY RV PERCENT PACED: 0.1 %

## 2016-05-29 DIAGNOSIS — M722 Plantar fascial fibromatosis: Secondary | ICD-10-CM | POA: Diagnosis not present

## 2016-05-29 DIAGNOSIS — L600 Ingrowing nail: Secondary | ICD-10-CM | POA: Diagnosis not present

## 2016-05-29 DIAGNOSIS — M71571 Other bursitis, not elsewhere classified, right ankle and foot: Secondary | ICD-10-CM | POA: Diagnosis not present

## 2016-06-28 ENCOUNTER — Ambulatory Visit (INDEPENDENT_AMBULATORY_CARE_PROVIDER_SITE_OTHER): Payer: Medicare Other | Admitting: Cardiology

## 2016-06-28 ENCOUNTER — Encounter: Payer: Self-pay | Admitting: Cardiology

## 2016-06-28 VITALS — BP 116/64 | HR 45 | Ht 71.0 in | Wt 196.0 lb

## 2016-06-28 DIAGNOSIS — I10 Essential (primary) hypertension: Secondary | ICD-10-CM | POA: Diagnosis not present

## 2016-06-28 DIAGNOSIS — I2510 Atherosclerotic heart disease of native coronary artery without angina pectoris: Secondary | ICD-10-CM | POA: Diagnosis not present

## 2016-06-28 DIAGNOSIS — I350 Nonrheumatic aortic (valve) stenosis: Secondary | ICD-10-CM | POA: Diagnosis not present

## 2016-06-28 DIAGNOSIS — I255 Ischemic cardiomyopathy: Secondary | ICD-10-CM | POA: Diagnosis not present

## 2016-06-28 DIAGNOSIS — E782 Mixed hyperlipidemia: Secondary | ICD-10-CM | POA: Diagnosis not present

## 2016-06-28 DIAGNOSIS — R7309 Other abnormal glucose: Secondary | ICD-10-CM | POA: Diagnosis not present

## 2016-06-28 NOTE — Patient Instructions (Signed)
Medication Instructions:  Your physician recommends that you continue on your current medications as directed. Please refer to the Current Medication list given to you today.   Labwork: PLEASE HAVE LABS DONE ASAP  Testing/Procedures: NONE  Follow-Up: Your physician wants you to follow-up in: 6 MONTHS .  You will receive a reminder letter in the mail two months in advance. If you don't receive a letter, please call our office to schedule the follow-up appointment.   Any Other Special Instructions Will Be Listed Below (If Applicable).     If you need a refill on your cardiac medications before your next appointment, please call your pharmacy.
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
## 2016-06-29 ENCOUNTER — Other Ambulatory Visit (HOSPITAL_COMMUNITY)
Admission: RE | Admit: 2016-06-29 | Discharge: 2016-06-29 | Disposition: A | Discharge status: Home / Self Care | Payer: Medicare Other | Source: Ambulatory Visit | Attending: Cardiology | Admitting: Cardiology

## 2016-06-29 DIAGNOSIS — R7309 Other abnormal glucose: Secondary | ICD-10-CM | POA: Diagnosis not present

## 2016-06-29 DIAGNOSIS — I10 Essential (primary) hypertension: Secondary | ICD-10-CM | POA: Diagnosis not present

## 2016-06-29 LAB — CBC WITH DIFFERENTIAL/PLATELET
BASOS ABS: 0 10*3/uL (ref 0.0–0.1)
BASOS PCT: 1 %
EOS ABS: 0.3 10*3/uL (ref 0.0–0.7)
Eosinophils Relative: 4 %
HCT: 43.7 % (ref 39.0–52.0)
HEMOGLOBIN: 14.4 g/dL (ref 13.0–17.0)
Lymphocytes Relative: 21 %
Lymphs Abs: 1.2 10*3/uL (ref 0.7–4.0)
MCH: 31.9 pg (ref 26.0–34.0)
MCHC: 33 g/dL (ref 30.0–36.0)
MCV: 96.7 fL (ref 78.0–100.0)
Monocytes Absolute: 0.5 10*3/uL (ref 0.1–1.0)
Monocytes Relative: 8 %
NEUTROS PCT: 66 %
Neutro Abs: 3.8 10*3/uL (ref 1.7–7.7)
Platelets: 176 10*3/uL (ref 150–400)
RBC: 4.52 MIL/uL (ref 4.22–5.81)
RDW: 13.3 % (ref 11.5–15.5)
WBC: 5.8 10*3/uL (ref 4.0–10.5)

## 2016-06-29 LAB — COMPREHENSIVE METABOLIC PANEL
ALBUMIN: 3.7 g/dL (ref 3.5–5.0)
ALK PHOS: 62 U/L (ref 38–126)
ALT: 32 U/L (ref 17–63)
AST: 27 U/L (ref 15–41)
Anion gap: 5 (ref 5–15)
BUN: 15 mg/dL (ref 6–20)
CALCIUM: 9 mg/dL (ref 8.9–10.3)
CO2: 27 mmol/L (ref 22–32)
Chloride: 107 mmol/L (ref 101–111)
Creatinine, Ser: 0.93 mg/dL (ref 0.61–1.24)
GFR calc Af Amer: >60 mL/min (ref >60)
GFR calc non Af Amer: >60 mL/min (ref >60)
GLUCOSE: 116 mg/dL — AB (ref 65–99)
Potassium: 4.3 mmol/L (ref 3.5–5.1)
SODIUM: 139 mmol/L (ref 135–145)
Total Bilirubin: 0.5 mg/dL (ref 0.3–1.2)
Total Protein: 6.7 g/dL (ref 6.5–8.1)

## 2016-06-29 LAB — LIPID PANEL
CHOL/HDL RATIO: 2 ratio
CHOLESTEROL: 81 mg/dL (ref 0–200)
HDL: 41 mg/dL (ref >40)
LDL Cholesterol: 33 mg/dL (ref 0–99)
Triglycerides: 35 mg/dL (ref <150)
VLDL: 7 mg/dL (ref 0–40)

## 2016-06-29 LAB — MAGNESIUM: Magnesium: 1.9 mg/dL (ref 1.7–2.4)

## 2016-06-29 LAB — TSH: TSH: 1.386 u[IU]/mL (ref 0.350–4.500)

## 2016-06-30 LAB — HEMOGLOBIN A1C
Hgb A1c MFr Bld: 6.2 % — ABNORMAL HIGH (ref 4.8–5.6)
MEAN PLASMA GLUCOSE: 131 mg/dL

## 2016-07-03 DIAGNOSIS — M71571 Other bursitis, not elsewhere classified, right ankle and foot: Secondary | ICD-10-CM | POA: Diagnosis not present

## 2016-07-03 DIAGNOSIS — L600 Ingrowing nail: Secondary | ICD-10-CM | POA: Diagnosis not present

## 2016-07-03 DIAGNOSIS — M722 Plantar fascial fibromatosis: Secondary | ICD-10-CM | POA: Diagnosis not present

## 2016-07-11 ENCOUNTER — Other Ambulatory Visit: Payer: Self-pay | Admitting: Cardiology

## 2016-07-19 DIAGNOSIS — M71571 Other bursitis, not elsewhere classified, right ankle and foot: Secondary | ICD-10-CM | POA: Diagnosis not present

## 2016-07-19 DIAGNOSIS — M722 Plantar fascial fibromatosis: Secondary | ICD-10-CM | POA: Diagnosis not present

## 2016-07-19 DIAGNOSIS — T8189XA Other complications of procedures, not elsewhere classified, initial encounter: Secondary | ICD-10-CM | POA: Diagnosis not present

## 2016-08-15 ENCOUNTER — Ambulatory Visit (INDEPENDENT_AMBULATORY_CARE_PROVIDER_SITE_OTHER): Payer: Medicare Other | Admitting: *Deleted

## 2016-08-15 DIAGNOSIS — I255 Ischemic cardiomyopathy: Secondary | ICD-10-CM

## 2016-08-15 DIAGNOSIS — I5022 Chronic systolic (congestive) heart failure: Secondary | ICD-10-CM | POA: Diagnosis not present

## 2016-08-16 DIAGNOSIS — M722 Plantar fascial fibromatosis: Secondary | ICD-10-CM | POA: Diagnosis not present

## 2016-08-16 DIAGNOSIS — L03031 Cellulitis of right toe: Secondary | ICD-10-CM | POA: Diagnosis not present

## 2016-08-16 DIAGNOSIS — M71571 Other bursitis, not elsewhere classified, right ankle and foot: Secondary | ICD-10-CM | POA: Diagnosis not present

## 2016-08-16 NOTE — Progress Notes (Signed)
Remote ICD transmission.   

## 2016-08-17 ENCOUNTER — Encounter: Payer: Self-pay | Admitting: Cardiology

## 2016-08-21 ENCOUNTER — Ambulatory Visit (INDEPENDENT_AMBULATORY_CARE_PROVIDER_SITE_OTHER): Payer: Medicare Other | Admitting: Internal Medicine

## 2016-08-21 ENCOUNTER — Encounter: Payer: Self-pay | Admitting: Internal Medicine

## 2016-08-21 VITALS — BP 120/74 | HR 59 | Ht 71.5 in | Wt 196.0 lb

## 2016-08-21 DIAGNOSIS — I255 Ischemic cardiomyopathy: Secondary | ICD-10-CM | POA: Diagnosis not present

## 2016-08-21 DIAGNOSIS — I2589 Other forms of chronic ischemic heart disease: Secondary | ICD-10-CM

## 2016-08-21 DIAGNOSIS — Z95810 Presence of automatic (implantable) cardiac defibrillator: Secondary | ICD-10-CM

## 2016-08-21 DIAGNOSIS — I5022 Chronic systolic (congestive) heart failure: Secondary | ICD-10-CM | POA: Diagnosis not present

## 2016-08-21 LAB — CUP PACEART INCLINIC DEVICE CHECK
Battery Remaining Longevity: 130 mo
Battery Voltage: 3.01 V
HighPow Impedance: 47 Ohm
HighPow Impedance: 59 Ohm
Implantable Lead Implant Date: 20080624
Implantable Lead Location: 753860
Implantable Pulse Generator Implant Date: 20170714
Lead Channel Impedance Value: 475 Ohm
Lead Channel Setting Pacing Amplitude: 2.5 V
Lead Channel Setting Sensing Sensitivity: 0.9 mV
MDC IDC MSMT LEADCHNL RV IMPEDANCE VALUE: 418 Ohm
MDC IDC MSMT LEADCHNL RV PACING THRESHOLD AMPLITUDE: 0.375 V
MDC IDC MSMT LEADCHNL RV PACING THRESHOLD PULSEWIDTH: 0.4 ms
MDC IDC MSMT LEADCHNL RV SENSING INTR AMPL: 4.25 mV
MDC IDC MSMT LEADCHNL RV SENSING INTR AMPL: 4.75 mV
MDC IDC SESS DTM: 20180730105155
MDC IDC SET LEADCHNL RV PACING PULSEWIDTH: 0.4 ms
MDC IDC STAT BRADY RV PERCENT PACED: 0.16 %

## 2016-08-21 MED ORDER — NITROGLYCERIN 0.4 MG SL SUBL: 0.4000 mg | SUBLINGUAL_TABLET | SUBLINGUAL | 4 refills | Status: DC | PRN

## 2016-08-21 NOTE — Patient Instructions (Addendum)
Medication Instructions:  Your physician recommends that you continue on your current medications as directed. Please refer to the Current Medication list given to you today.   Labwork: NONE  Testing/Procedures: NONE  Follow-Up: Your physician wants you to follow-up in: 1 Year with Dr. Taylor. You will receive a reminder letter in the mail two months in advance. If you don't receive a letter, please call our office to schedule the follow-up appointment. Remote monitoring is used to monitor your Pacemaker of ICD from home. This monitoring reduces the number of office visits required to check your device to one time per year. It allows us to keep an eye on the functioning of your device to ensure it is working properly. You are scheduled for a device check from home on 11/20/16. You may send your transmission at any time that day. If you have a wireless device, the transmission will be sent automatically. After your physician reviews your transmission, you will receive a postcard with your next transmission date.    Any Other Special Instructions Will Be Listed Below (If Applicable).     If you need a refill on your cardiac medications before your next appointment, please call your pharmacy.  Thank you for choosing [facility]!   
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
## 2016-09-01 DIAGNOSIS — L600 Ingrowing nail: Secondary | ICD-10-CM | POA: Diagnosis not present

## 2016-09-18 DIAGNOSIS — L03031 Cellulitis of right toe: Secondary | ICD-10-CM | POA: Diagnosis not present

## 2016-10-04 DIAGNOSIS — H40053 Ocular hypertension, bilateral: Secondary | ICD-10-CM | POA: Diagnosis not present

## 2016-10-04 DIAGNOSIS — H2513 Age-related nuclear cataract, bilateral: Secondary | ICD-10-CM | POA: Diagnosis not present

## 2016-10-04 DIAGNOSIS — H40013 Open angle with borderline findings, low risk, bilateral: Secondary | ICD-10-CM | POA: Diagnosis not present

## 2016-10-06 DIAGNOSIS — E663 Overweight: Secondary | ICD-10-CM | POA: Diagnosis not present

## 2016-10-06 DIAGNOSIS — Z23 Encounter for immunization: Secondary | ICD-10-CM | POA: Diagnosis not present

## 2016-10-06 DIAGNOSIS — Z1389 Encounter for screening for other disorder: Secondary | ICD-10-CM | POA: Diagnosis not present

## 2016-10-06 DIAGNOSIS — J329 Chronic sinusitis, unspecified: Secondary | ICD-10-CM | POA: Diagnosis not present

## 2016-10-06 DIAGNOSIS — Z6826 Body mass index (BMI) 26.0-26.9, adult: Secondary | ICD-10-CM | POA: Diagnosis not present

## 2016-11-20 ENCOUNTER — Ambulatory Visit (INDEPENDENT_AMBULATORY_CARE_PROVIDER_SITE_OTHER): Payer: Medicare Other | Admitting: *Deleted

## 2016-11-20 DIAGNOSIS — I255 Ischemic cardiomyopathy: Secondary | ICD-10-CM

## 2016-11-20 DIAGNOSIS — I5022 Chronic systolic (congestive) heart failure: Secondary | ICD-10-CM

## 2016-11-21 LAB — CUP PACEART REMOTE DEVICE CHECK
Battery Remaining Longevity: 128 mo
Brady Statistic RV Percent Paced: 0.19 %
HIGH POWER IMPEDANCE MEASURED VALUE: 44 Ohm
HIGH POWER IMPEDANCE MEASURED VALUE: 57 Ohm
Implantable Lead Implant Date: 20080624
Lead Channel Impedance Value: 418 Ohm
Lead Channel Pacing Threshold Amplitude: 0.375 V
Lead Channel Pacing Threshold Pulse Width: 0.4 ms
Lead Channel Sensing Intrinsic Amplitude: 4.375 mV
Lead Channel Sensing Intrinsic Amplitude: 4.375 mV
Lead Channel Setting Pacing Amplitude: 2.5 V
MDC IDC LEAD LOCATION: 753860
MDC IDC MSMT BATTERY VOLTAGE: 3.01 V
MDC IDC MSMT LEADCHNL RV IMPEDANCE VALUE: 475 Ohm
MDC IDC PG IMPLANT DT: 20170714
MDC IDC SESS DTM: 20181029073424
MDC IDC SET LEADCHNL RV PACING PULSEWIDTH: 0.4 ms
MDC IDC SET LEADCHNL RV SENSING SENSITIVITY: 0.9 mV

## 2016-11-21 NOTE — Progress Notes (Signed)
Remote ICD transmission.   

## 2016-11-28 ENCOUNTER — Encounter: Payer: Self-pay | Admitting: Cardiology

## 2016-12-11 DIAGNOSIS — H2511 Age-related nuclear cataract, right eye: Secondary | ICD-10-CM | POA: Diagnosis not present

## 2016-12-11 DIAGNOSIS — H2513 Age-related nuclear cataract, bilateral: Secondary | ICD-10-CM | POA: Diagnosis not present

## 2016-12-26 NOTE — Patient Instructions (Signed)
Your procedure is scheduled on: 01/01/2017  Report to [facility] at  640  AM.  Call this number if you have problems the morning of surgery: [phone]   Do not eat food or drink liquids :After Midnight.      Take these medicines the morning of surgery with A SIP OF WATER: coreg, lisinopril.   Do not wear jewelry, make-up or nail polish.  Do not wear lotions, powders, or perfumes. You may wear deodorant.  Do not shave 48 hours prior to surgery.  Do not bring valuables to the hospital.  Contacts, dentures or bridgework may not be worn into surgery.  Leave suitcase in the car. After surgery it may be brought to your room.  For patients admitted to the hospital, checkout time is 11:00 AM the day of discharge.   Patients discharged the day of surgery will not be allowed to drive home.  :     Please read over the following fact sheets that you were given: Coughing and Deep Breathing, Surgical Site Infection Prevention, Anesthesia Post-op Instructions and Care and Recovery After Surgery    Cataract A cataract is a clouding of the lens of the eye. When a lens becomes cloudy, vision is reduced based on the degree and nature of the clouding. Many cataracts reduce vision to some degree. Some cataracts make people more near-sighted as they develop. Other cataracts increase glare. Cataracts that are ignored and become worse can sometimes look white. The white color can be seen through the pupil. CAUSES   Aging. However, cataracts may occur at any age, even in newborns.   Certain drugs.   Trauma to the eye.   Certain diseases such as diabetes.   Specific eye diseases such as chronic inflammation inside the eye or a sudden attack of a rare form of glaucoma.   Inherited or acquired medical problems.  SYMPTOMS   Gradual, progressive drop in vision in the affected eye.   Severe, rapid visual loss. This most often happens when trauma is the cause.  DIAGNOSIS  To detect a cataract, an eye  doctor examines the lens. Cataracts are best diagnosed with an exam of the eyes with the pupils enlarged (dilated) by drops.  TREATMENT  For an early cataract, vision may improve by using different eyeglasses or stronger lighting. If that does not help your vision, surgery is the only effective treatment. A cataract needs to be surgically removed when vision loss interferes with your everyday activities, such as driving, reading, or watching TV. A cataract may also have to be removed if it prevents examination or treatment of another eye problem. Surgery removes the cloudy lens and usually replaces it with a substitute lens (intraocular lens, IOL).  At a time when both you and your doctor agree, the cataract will be surgically removed. If you have cataracts in both eyes, only one is usually removed at a time. This allows the operated eye to heal and be out of danger from any possible problems after surgery (such as infection or poor wound healing). In rare cases, a cataract may be doing damage to your eye. In these cases, your caregiver may advise surgical removal right away. The vast majority of people who have cataract surgery have better vision afterward. HOME CARE INSTRUCTIONS  If you are not planning surgery, you may be asked to do the following:  Use different eyeglasses.   Use stronger or brighter lighting.   Ask your eye doctor about reducing your medicine dose  or changing medicines if it is thought that a medicine caused your cataract. Changing medicines does not make the cataract go away on its own.   Become familiar with your surroundings. Poor vision can lead to injury. Avoid bumping into things on the affected side. You are at a higher risk for tripping or falling.   Exercise extreme care when driving or operating machinery.   Wear sunglasses if you are sensitive to bright light or experiencing problems with glare.  SEEK IMMEDIATE MEDICAL CARE IF:   You have a worsening or sudden  vision loss.   You notice redness, swelling, or increasing pain in the eye.   You have a fever.  Document Released: 01/09/2005 Document Revised: 12/29/2010 Document Reviewed: 09/02/2010 [facility] Patient Information 2012 [place].PATIENT INSTRUCTIONS POST-ANESTHESIA  IMMEDIATELY FOLLOWING SURGERY:  Do not drive or operate machinery for the first twenty four hours after surgery.  Do not make any important decisions for twenty four hours after surgery or while taking narcotic pain medications or sedatives.  If you develop intractable nausea and vomiting or a severe headache please notify your doctor immediately.  FOLLOW-UP:  Please make an appointment with your surgeon as instructed. You do not need to follow up with anesthesia unless specifically instructed to do so.  WOUND CARE INSTRUCTIONS (if applicable):  Keep a dry clean dressing on the anesthesia/puncture wound site if there is drainage.  Once the wound has quit draining you may leave it open to air.  Generally you should leave the bandage intact for twenty four hours unless there is drainage.  If the epidural site drains for more than 36-48 hours please call the anesthesia department.  QUESTIONS?:  Please feel free to call your physician or the hospital operator if you have any questions, and they will be happy to assist you.

## 2016-12-27 ENCOUNTER — Inpatient Hospital Stay (HOSPITAL_COMMUNITY): Admission: RE | Admit: 2016-12-27 | Payer: Medicare Other | Source: Ambulatory Visit

## 2016-12-28 ENCOUNTER — Other Ambulatory Visit: Payer: Self-pay

## 2016-12-28 ENCOUNTER — Encounter (HOSPITAL_COMMUNITY): Payer: Self-pay

## 2016-12-28 ENCOUNTER — Encounter (HOSPITAL_COMMUNITY)
Admission: RE | Admit: 2016-12-28 | Discharge: 2016-12-28 | Disposition: A | Discharge status: Home / Self Care | Payer: Medicare Other | Source: Ambulatory Visit | Attending: Ophthalmology | Admitting: Ophthalmology

## 2016-12-28 DIAGNOSIS — Z01812 Encounter for preprocedural laboratory examination: Secondary | ICD-10-CM | POA: Insufficient documentation

## 2016-12-28 DIAGNOSIS — I452 Bifascicular block: Secondary | ICD-10-CM | POA: Insufficient documentation

## 2016-12-28 DIAGNOSIS — R001 Bradycardia, unspecified: Secondary | ICD-10-CM | POA: Diagnosis not present

## 2016-12-28 DIAGNOSIS — Z01818 Encounter for other preprocedural examination: Secondary | ICD-10-CM | POA: Insufficient documentation

## 2016-12-28 DIAGNOSIS — R9431 Abnormal electrocardiogram [ECG] [EKG]: Secondary | ICD-10-CM | POA: Insufficient documentation

## 2016-12-28 HISTORY — DX: Chronic obstructive pulmonary disease, unspecified: J44.9

## 2016-12-28 HISTORY — DX: Sleep apnea, unspecified: G47.30

## 2016-12-28 LAB — BASIC METABOLIC PANEL
Anion gap: 6 (ref 5–15)
BUN: 18 mg/dL (ref 6–20)
CALCIUM: 9 mg/dL (ref 8.9–10.3)
CO2: 29 mmol/L (ref 22–32)
CREATININE: 0.82 mg/dL (ref 0.61–1.24)
Chloride: 105 mmol/L (ref 101–111)
GFR calc non Af Amer: >60 mL/min (ref >60)
Glucose, Bld: 106 mg/dL — ABNORMAL HIGH (ref 65–99)
Potassium: 4.1 mmol/L (ref 3.5–5.1)
SODIUM: 140 mmol/L (ref 135–145)

## 2016-12-28 LAB — CBC WITH DIFFERENTIAL/PLATELET
BASOS PCT: 0 %
Basophils Absolute: 0 10*3/uL (ref 0.0–0.1)
EOS ABS: 0.2 10*3/uL (ref 0.0–0.7)
Eosinophils Relative: 3 %
HCT: 44.6 % (ref 39.0–52.0)
Hemoglobin: 14.1 g/dL (ref 13.0–17.0)
Lymphocytes Relative: 22 %
Lymphs Abs: 1.4 10*3/uL (ref 0.7–4.0)
MCH: 32 pg (ref 26.0–34.0)
MCHC: 31.6 g/dL (ref 30.0–36.0)
MCV: 101.1 fL — ABNORMAL HIGH (ref 78.0–100.0)
MONO ABS: 0.5 10*3/uL (ref 0.1–1.0)
MONOS PCT: 8 %
Neutro Abs: 4.3 10*3/uL (ref 1.7–7.7)
Neutrophils Relative %: 67 %
Platelets: 180 10*3/uL (ref 150–400)
RBC: 4.41 MIL/uL (ref 4.22–5.81)
RDW: 13.3 % (ref 11.5–15.5)
WBC: 6.4 10*3/uL (ref 4.0–10.5)

## 2017-01-05 ENCOUNTER — Encounter (HOSPITAL_COMMUNITY): Payer: Self-pay

## 2017-01-05 ENCOUNTER — Encounter (HOSPITAL_COMMUNITY)
Admission: RE | Admit: 2017-01-05 | Discharge: 2017-01-05 | Disposition: A | Discharge status: Home / Self Care | Payer: Medicare Other | Source: Ambulatory Visit | Attending: Ophthalmology | Admitting: Ophthalmology

## 2017-01-05 MED ORDER — NEOMYCIN-POLYMYXIN-DEXAMETH 3.5-10000-0.1 OP SUSP
OPHTHALMIC | Status: AC
  Filled 2017-01-05: qty 5

## 2017-01-05 MED ORDER — LIDOCAINE HCL (PF) 1 % IJ SOLN
INTRAMUSCULAR | Status: AC
  Filled 2017-01-05: qty 2

## 2017-01-05 MED ORDER — PHENYLEPHRINE HCL 2.5 % OP SOLN
OPHTHALMIC | Status: AC
  Filled 2017-01-05: qty 15

## 2017-01-05 MED ORDER — CYCLOPENTOLATE-PHENYLEPHRINE 0.2-1 % OP SOLN
OPHTHALMIC | Status: AC
  Filled 2017-01-05: qty 2

## 2017-01-05 MED ORDER — TETRACAINE HCL 0.5 % OP SOLN
OPHTHALMIC | Status: AC
  Filled 2017-01-05: qty 4

## 2017-01-05 MED ORDER — LIDOCAINE HCL 3.5 % OP GEL
OPHTHALMIC | Status: AC
  Filled 2017-01-05: qty 1

## 2017-01-08 ENCOUNTER — Ambulatory Visit (HOSPITAL_COMMUNITY): Payer: Medicare Other | Admitting: Anesthesiology

## 2017-01-08 ENCOUNTER — Encounter (HOSPITAL_COMMUNITY)
Admission: RE | Disposition: A | Discharge status: Home / Self Care | Payer: Self-pay | Source: Ambulatory Visit | Attending: Ophthalmology

## 2017-01-08 ENCOUNTER — Ambulatory Visit (HOSPITAL_COMMUNITY)
Admission: RE | Admit: 2017-01-08 | Discharge: 2017-01-08 | Disposition: A | Discharge status: Home / Self Care | Payer: Medicare Other | Source: Ambulatory Visit | Attending: Ophthalmology | Admitting: Ophthalmology

## 2017-01-08 ENCOUNTER — Encounter (HOSPITAL_COMMUNITY): Payer: Self-pay | Admitting: *Deleted

## 2017-01-08 DIAGNOSIS — Z79899 Other long term (current) drug therapy: Secondary | ICD-10-CM | POA: Insufficient documentation

## 2017-01-08 DIAGNOSIS — I10 Essential (primary) hypertension: Secondary | ICD-10-CM | POA: Diagnosis not present

## 2017-01-08 DIAGNOSIS — G4730 Sleep apnea, unspecified: Secondary | ICD-10-CM | POA: Insufficient documentation

## 2017-01-08 DIAGNOSIS — J449 Chronic obstructive pulmonary disease, unspecified: Secondary | ICD-10-CM | POA: Insufficient documentation

## 2017-01-08 DIAGNOSIS — Z87891 Personal history of nicotine dependence: Secondary | ICD-10-CM | POA: Diagnosis not present

## 2017-01-08 DIAGNOSIS — H2511 Age-related nuclear cataract, right eye: Secondary | ICD-10-CM | POA: Diagnosis not present

## 2017-01-08 DIAGNOSIS — I2510 Atherosclerotic heart disease of native coronary artery without angina pectoris: Secondary | ICD-10-CM | POA: Diagnosis not present

## 2017-01-08 DIAGNOSIS — I252 Old myocardial infarction: Secondary | ICD-10-CM | POA: Insufficient documentation

## 2017-01-08 HISTORY — PX: CATARACT EXTRACTION W/PHACO: SHX586

## 2017-01-08 SURGERY — PHACOEMULSIFICATION, CATARACT, WITH IOL INSERTION
Anesthesia: Monitor Anesthesia Care | Site: Eye | Laterality: Right

## 2017-01-08 MED ORDER — LIDOCAINE HCL (PF) 1 % IJ SOLN
INTRAMUSCULAR | Status: DC | PRN
  Administered 2017-01-08: .7 mL

## 2017-01-08 MED ORDER — TETRACAINE HCL 0.5 % OP SOLN
1.0000 [drp] | OPHTHALMIC | Status: AC
  Administered 2017-01-08 (×3): 1 [drp] via OPHTHALMIC

## 2017-01-08 MED ORDER — BSS IO SOLN
INTRAOCULAR | Status: DC | PRN
Start: 2017-01-08 — End: 2017-01-08
  Administered 2017-01-08: 15 mL

## 2017-01-08 MED ORDER — LACTATED RINGERS IV SOLN
INTRAVENOUS | Status: DC
  Administered 2017-01-08: 09:00:00 via INTRAVENOUS

## 2017-01-08 MED ORDER — EPINEPHRINE PF 1 MG/ML IJ SOLN
INTRAMUSCULAR | Status: AC
  Filled 2017-01-08: qty 1

## 2017-01-08 MED ORDER — LIDOCAINE HCL 3.5 % OP GEL
1.0000 | Freq: Once | OPHTHALMIC | Status: AC
  Administered 2017-01-08: 1 via OPHTHALMIC

## 2017-01-08 MED ORDER — PHENYLEPHRINE HCL 2.5 % OP SOLN
1.0000 [drp] | OPHTHALMIC | Status: DC | PRN
  Administered 2017-01-08: 1 [drp] via OPHTHALMIC

## 2017-01-08 MED ORDER — CYCLOPENTOLATE-PHENYLEPHRINE 0.2-1 % OP SOLN
1.0000 [drp] | OPHTHALMIC | Status: AC
  Administered 2017-01-08 (×3): 1 [drp] via OPHTHALMIC

## 2017-01-08 MED ORDER — MIDAZOLAM HCL 2 MG/2ML IJ SOLN
1.0000 mg | INTRAMUSCULAR | Status: AC
  Administered 2017-01-08: 2 mg via INTRAVENOUS

## 2017-01-08 MED ORDER — MIDAZOLAM HCL 2 MG/2ML IJ SOLN
INTRAMUSCULAR | Status: AC
  Filled 2017-01-08: qty 2

## 2017-01-08 MED ORDER — NEOMYCIN-POLYMYXIN-DEXAMETH 3.5-10000-0.1 OP SUSP
OPHTHALMIC | Status: DC | PRN
Start: 2017-01-08 — End: 2017-01-08
  Administered 2017-01-08: 2 [drp] via OPHTHALMIC

## 2017-01-08 MED ORDER — FENTANYL CITRATE (PF) 100 MCG/2ML IJ SOLN
25.0000 ug | Freq: Once | INTRAMUSCULAR | Status: AC
  Administered 2017-01-08: 25 ug via INTRAVENOUS

## 2017-01-08 MED ORDER — POVIDONE-IODINE 5 % OP SOLN
OPHTHALMIC | Status: DC | PRN
Start: 2017-01-08 — End: 2017-01-08
  Administered 2017-01-08: 1 via OPHTHALMIC

## 2017-01-08 MED ORDER — EPINEPHRINE PF 1 MG/ML IJ SOLN
INTRAOCULAR | Status: DC | PRN
  Administered 2017-01-08: 500 mL

## 2017-01-08 MED ORDER — FENTANYL CITRATE (PF) 100 MCG/2ML IJ SOLN
INTRAMUSCULAR | Status: AC
  Filled 2017-01-08: qty 2

## 2017-01-08 MED ORDER — PROVISC 10 MG/ML IO SOLN
INTRAOCULAR | Status: DC | PRN
  Administered 2017-01-08: 0.85 mL via INTRAOCULAR

## 2017-01-08 SURGICAL SUPPLY — 12 items
CLOTH BEACON ORANGE TIMEOUT ST (SAFETY) ×1 IMPLANT
EYE SHIELD UNIVERSAL CLEAR (GAUZE/BANDAGES/DRESSINGS) ×1 IMPLANT
GLOVE BIOGEL PI IND STRL 6.5 (GLOVE) IMPLANT
GLOVE BIOGEL PI IND STRL 7.0 (GLOVE) IMPLANT
GLOVE BIOGEL PI INDICATOR 6.5 (GLOVE) ×1
GLOVE BIOGEL PI INDICATOR 7.0 (GLOVE) ×1
PAD ARMBOARD 7.5X6 YLW CONV (MISCELLANEOUS) ×1 IMPLANT
SIGHTPATH CAT PROC W REG LENS (Ophthalmic Related) ×2 IMPLANT
SYRINGE LUER LOK 1CC (MISCELLANEOUS) ×1 IMPLANT
TAPE SURG TRANSPORE 1 IN (GAUZE/BANDAGES/DRESSINGS) IMPLANT
TAPE SURGICAL TRANSPORE 1 IN (GAUZE/BANDAGES/DRESSINGS) ×1
WATER STERILE IRR 250ML POUR (IV SOLUTION) ×1 IMPLANT

## 2017-01-08 NOTE — Discharge Instructions (Signed)

## 2017-01-08 NOTE — Op Note (Signed)
Date of Admission: 01/08/2017  Date of Surgery: 01/08/2017  Pre-Op Dx: Cataract Right  Eye  Post-Op Dx: Senile Nuclear Cataract  Right  Eye,  Dx Code H25.11  Surgeon: Tonny Branch, M.D.  Assistants: None  Anesthesia: Topical with MAC  Indications: Painless, progressive loss of vision with compromise of daily activities.  Surgery: Cataract Extraction with Intraocular lens Implant Right Eye  Discription: The patient had dilating drops and viscous lidocaine placed into the Right eye in the pre-op holding area. After transfer to the operating room, a time out was performed. The patient was then prepped and draped. Beginning with a 81m blade a paracentesis port was made at the surgeon's 2 o'clock position. The anterior chamber was then filled with 1% non-preserved lidocaine. This was followed by filling the anterior chamber with Provisc.  A 2.453mkeratome blade was used to make a clear corneal incision at the temporal limbus.  A bent cystatome needle was used to create a continuous tear capsulotomy. Hydrodissection was performed with balanced salt solution on a Fine canula. The lens nucleus was then removed using the phacoemulsification handpiece. Residual cortex was removed with the I&A handpiece. The anterior chamber and capsular bag were refilled with Provisc. A posterior chamber intraocular lens was placed into the capsular bag with it's injector. The implant was positioned with the Kuglan hook. The Provisc was then removed from the anterior chamber and capsular bag with the I&A handpiece. Stromal hydration of the main incision and paracentesis port was performed with BSS on a Fine canula. The wounds were tested for leak which was negative. The patient tolerated the procedure well. There were no operative complications. The patient was then transferred to the recovery room in stable condition.  Complications: None  Specimen: None  EBL: None  Prosthetic device: J[employer]J Technis, PCB00, power 22.5,  SN [id number]

## 2017-01-08 NOTE — Transfer of Care (Signed)
Immediate Anesthesia Transfer of Care Note  Patient: [name]  Procedure(s) Performed: CATARACT EXTRACTION PHACO AND INTRAOCULAR LENS PLACEMENT RIGHT EYE CDE=10.63 (Right Eye)  Patient Location: Short Stay  Anesthesia Type:MAC  Level of Consciousness: awake, alert  and patient cooperative  Airway & Oxygen Therapy: Patient Spontanous Breathing  Post-op Assessment: Report given to RN and Post -op Vital signs reviewed and stable  Post vital signs: Reviewed and stable  Last Vitals:  Vitals:   01/08/17 0820  BP: 129/73  Pulse: (!) 47  Resp: 18  Temp: 36.6 C  SpO2: 95%    Last Pain:  Vitals:   01/08/17 0820  TempSrc: Oral         Complications: No apparent anesthesia complications

## 2017-01-08 NOTE — Anesthesia Preprocedure Evaluation (Signed)
Anesthesia Evaluation  Patient identified by MRN, date of birth, ID band Patient awake    Reviewed: Allergy & Precautions, NPO status , Patient's Chart, lab work & pertinent test results, reviewed documented beta blocker date and time   Airway Mallampati: II  TM Distance: >3 FB Neck ROM: Full    Dental  (+) Partial Upper, Teeth Intact   Pulmonary shortness of breath and with exertion, sleep apnea , COPD, former smoker,    breath sounds clear to auscultation       Cardiovascular hypertension, Pt. on medications and Pt. on home beta blockers (-) angina+ CAD, + Past MI and + CABG  + Cardiac Defibrillator  Rhythm:Regular Rate:Normal     Neuro/Psych    GI/Hepatic negative GI ROS, Neg liver ROS,   Endo/Other  negative endocrine ROS  Renal/GU negative Renal ROS     Musculoskeletal negative musculoskeletal ROS (+)   Abdominal   Peds  Hematology   Anesthesia Other Findings   Reproductive/Obstetrics                             Anesthesia Physical Anesthesia Plan  ASA: III  Anesthesia Plan: MAC   Post-op Pain Management:    Induction: Intravenous  PONV Risk Score and Plan:   Airway Management Planned: Nasal Cannula  Additional Equipment:   Intra-op Plan:   Post-operative Plan:   Informed Consent: I have reviewed the patients History and Physical, chart, labs and discussed the procedure including the risks, benefits and alternatives for the proposed anesthesia with the patient or authorized representative who has indicated his/her understanding and acceptance.     Plan Discussed with:   Anesthesia Plan Comments:         Anesthesia Quick Evaluation

## 2017-01-08 NOTE — Anesthesia Postprocedure Evaluation (Signed)
Anesthesia Post Note  Patient: [name]  Procedure(s) Performed: CATARACT EXTRACTION PHACO AND INTRAOCULAR LENS PLACEMENT RIGHT EYE CDE=10.63 (Right Eye)  Patient location during evaluation: Short Stay Anesthesia Type: MAC Level of consciousness: awake and alert Pain management: pain level controlled Vital Signs Assessment: post-procedure vital signs reviewed and stable Respiratory status: spontaneous breathing Cardiovascular status: stable and bradycardic Postop Assessment: no apparent nausea or vomiting Anesthetic complications: no     Last Vitals:  Vitals:   01/08/17 0820  BP: 129/73  Pulse: (!) 47  Resp: 18  Temp: 36.6 C  SpO2: 95%    Last Pain:  Vitals:   01/08/17 0820  TempSrc: Oral                 [name]

## 2017-01-09 ENCOUNTER — Encounter (HOSPITAL_COMMUNITY): Payer: Self-pay | Admitting: Ophthalmology

## 2017-01-22 DIAGNOSIS — H2512 Age-related nuclear cataract, left eye: Secondary | ICD-10-CM | POA: Diagnosis not present

## 2017-01-31 ENCOUNTER — Ambulatory Visit (INDEPENDENT_AMBULATORY_CARE_PROVIDER_SITE_OTHER): Payer: Medicare Other | Admitting: Cardiology

## 2017-01-31 ENCOUNTER — Encounter: Payer: Self-pay | Admitting: Cardiology

## 2017-01-31 VITALS — BP 124/72 | HR 53 | Ht 71.5 in | Wt 192.0 lb

## 2017-01-31 DIAGNOSIS — E782 Mixed hyperlipidemia: Secondary | ICD-10-CM | POA: Diagnosis not present

## 2017-01-31 DIAGNOSIS — I2510 Atherosclerotic heart disease of native coronary artery without angina pectoris: Secondary | ICD-10-CM

## 2017-01-31 DIAGNOSIS — I5022 Chronic systolic (congestive) heart failure: Secondary | ICD-10-CM | POA: Diagnosis not present

## 2017-01-31 DIAGNOSIS — I10 Essential (primary) hypertension: Secondary | ICD-10-CM | POA: Diagnosis not present

## 2017-01-31 NOTE — Patient Instructions (Signed)
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
## 2017-02-05 ENCOUNTER — Encounter: Payer: Self-pay | Admitting: Cardiology

## 2017-02-12 ENCOUNTER — Encounter (HOSPITAL_COMMUNITY): Payer: Self-pay

## 2017-02-13 ENCOUNTER — Encounter (HOSPITAL_COMMUNITY)
Admission: RE | Admit: 2017-02-13 | Discharge: 2017-02-13 | Disposition: A | Discharge status: Home / Self Care | Payer: Medicare Other | Source: Ambulatory Visit | Attending: Ophthalmology | Admitting: Ophthalmology

## 2017-02-19 ENCOUNTER — Encounter (HOSPITAL_COMMUNITY): Payer: Self-pay | Admitting: *Deleted

## 2017-02-19 ENCOUNTER — Ambulatory Visit (INDEPENDENT_AMBULATORY_CARE_PROVIDER_SITE_OTHER): Payer: Medicare Other | Admitting: *Deleted

## 2017-02-19 ENCOUNTER — Ambulatory Visit (HOSPITAL_COMMUNITY): Payer: Medicare Other | Admitting: Anesthesiology

## 2017-02-19 ENCOUNTER — Encounter (HOSPITAL_COMMUNITY)
Admission: RE | Disposition: A | Discharge status: Home / Self Care | Payer: Self-pay | Source: Ambulatory Visit | Attending: Ophthalmology

## 2017-02-19 ENCOUNTER — Ambulatory Visit (HOSPITAL_COMMUNITY)
Admission: RE | Admit: 2017-02-19 | Discharge: 2017-02-19 | Disposition: A | Discharge status: Home / Self Care | Payer: Medicare Other | Source: Ambulatory Visit | Attending: Ophthalmology | Admitting: Ophthalmology

## 2017-02-19 DIAGNOSIS — I252 Old myocardial infarction: Secondary | ICD-10-CM | POA: Insufficient documentation

## 2017-02-19 DIAGNOSIS — I10 Essential (primary) hypertension: Secondary | ICD-10-CM | POA: Insufficient documentation

## 2017-02-19 DIAGNOSIS — G4730 Sleep apnea, unspecified: Secondary | ICD-10-CM | POA: Insufficient documentation

## 2017-02-19 DIAGNOSIS — I5022 Chronic systolic (congestive) heart failure: Secondary | ICD-10-CM

## 2017-02-19 DIAGNOSIS — J449 Chronic obstructive pulmonary disease, unspecified: Secondary | ICD-10-CM | POA: Diagnosis not present

## 2017-02-19 DIAGNOSIS — Z87891 Personal history of nicotine dependence: Secondary | ICD-10-CM | POA: Insufficient documentation

## 2017-02-19 DIAGNOSIS — I255 Ischemic cardiomyopathy: Secondary | ICD-10-CM | POA: Diagnosis not present

## 2017-02-19 DIAGNOSIS — Z79899 Other long term (current) drug therapy: Secondary | ICD-10-CM | POA: Insufficient documentation

## 2017-02-19 DIAGNOSIS — H2512 Age-related nuclear cataract, left eye: Secondary | ICD-10-CM | POA: Diagnosis not present

## 2017-02-19 HISTORY — PX: CATARACT EXTRACTION W/PHACO: SHX586

## 2017-02-19 SURGERY — PHACOEMULSIFICATION, CATARACT, WITH IOL INSERTION
Anesthesia: Monitor Anesthesia Care | Site: Eye | Laterality: Left

## 2017-02-19 MED ORDER — GLYCOPYRROLATE 0.2 MG/ML IJ SOLN
INTRAMUSCULAR | Status: DC | PRN
  Administered 2017-02-19: 0.2 mg via INTRAVENOUS

## 2017-02-19 MED ORDER — POVIDONE-IODINE 5 % OP SOLN
OPHTHALMIC | Status: DC | PRN
  Administered 2017-02-19: 1 via OPHTHALMIC

## 2017-02-19 MED ORDER — NEOMYCIN-POLYMYXIN-DEXAMETH 3.5-10000-0.1 OP SUSP
OPHTHALMIC | Status: DC | PRN
  Administered 2017-02-19: 2 [drp] via OPHTHALMIC

## 2017-02-19 MED ORDER — ONDANSETRON 4 MG PO TBDP
4.0000 mg | ORAL_TABLET | Freq: Once | ORAL | Status: AC
  Administered 2017-02-19: 4 mg via ORAL

## 2017-02-19 MED ORDER — MIDAZOLAM HCL 2 MG/2ML IJ SOLN
1.0000 mg | Freq: Once | INTRAMUSCULAR | Status: AC | PRN
  Administered 2017-02-19: 2 mg via INTRAVENOUS

## 2017-02-19 MED ORDER — BSS IO SOLN
INTRAOCULAR | Status: DC | PRN
  Administered 2017-02-19: 15 mL

## 2017-02-19 MED ORDER — PROVISC 10 MG/ML IO SOLN
INTRAOCULAR | Status: DC | PRN
  Administered 2017-02-19: 0.85 mL via INTRAOCULAR

## 2017-02-19 MED ORDER — ONDANSETRON 4 MG PO TBDP
ORAL_TABLET | ORAL | Status: AC
  Filled 2017-02-19: qty 1

## 2017-02-19 MED ORDER — TETRACAINE HCL 0.5 % OP SOLN
1.0000 [drp] | OPHTHALMIC | Status: AC
  Administered 2017-02-19 (×3): 1 [drp] via OPHTHALMIC

## 2017-02-19 MED ORDER — MIDAZOLAM HCL 2 MG/2ML IJ SOLN
INTRAMUSCULAR | Status: AC
  Filled 2017-02-19: qty 2

## 2017-02-19 MED ORDER — EPINEPHRINE PF 1 MG/ML IJ SOLN
INTRAOCULAR | Status: DC | PRN
  Administered 2017-02-19: 500 mL

## 2017-02-19 MED ORDER — CYCLOPENTOLATE-PHENYLEPHRINE 0.2-1 % OP SOLN
1.0000 [drp] | OPHTHALMIC | Status: AC
  Administered 2017-02-19 (×3): 1 [drp] via OPHTHALMIC

## 2017-02-19 MED ORDER — LIDOCAINE HCL (PF) 1 % IJ SOLN
INTRAMUSCULAR | Status: DC | PRN
  Administered 2017-02-19: .5 mL

## 2017-02-19 MED ORDER — LIDOCAINE HCL 3.5 % OP GEL
1.0000 "application " | Freq: Once | OPHTHALMIC | Status: AC
  Administered 2017-02-19: 1 via OPHTHALMIC

## 2017-02-19 MED ORDER — PHENYLEPHRINE HCL 2.5 % OP SOLN
1.0000 [drp] | OPHTHALMIC | Status: AC
  Administered 2017-02-19 (×3): 1 [drp] via OPHTHALMIC

## 2017-02-19 MED ORDER — LACTATED RINGERS IV SOLN
INTRAVENOUS | Status: DC
  Administered 2017-02-19: 11:00:00 via INTRAVENOUS

## 2017-02-19 SURGICAL SUPPLY — 13 items
CLOTH BEACON ORANGE TIMEOUT ST (SAFETY) ×2 IMPLANT
EYE SHIELD UNIVERSAL CLEAR (GAUZE/BANDAGES/DRESSINGS) ×2 IMPLANT
GLOVE BIOGEL PI IND STRL 6.5 (GLOVE) IMPLANT
GLOVE BIOGEL PI IND STRL 7.0 (GLOVE) IMPLANT
GLOVE BIOGEL PI INDICATOR 6.5 (GLOVE) ×2
GLOVE BIOGEL PI INDICATOR 7.0 (GLOVE) ×2
LENS ALC ACRYL/TECN (Ophthalmic Related) ×2 IMPLANT
PAD ARMBOARD 7.5X6 YLW CONV (MISCELLANEOUS) ×2 IMPLANT
SYRINGE LUER LOK 1CC (MISCELLANEOUS) ×2 IMPLANT
TAPE SURG TRANSPORE 1 IN (GAUZE/BANDAGES/DRESSINGS) IMPLANT
TAPE SURGICAL TRANSPORE 1 IN (GAUZE/BANDAGES/DRESSINGS) ×2
VISCOELASTIC ADDITIONAL (OPHTHALMIC RELATED) ×2 IMPLANT
WATER STERILE IRR 250ML POUR (IV SOLUTION) ×2 IMPLANT

## 2017-02-19 NOTE — Op Note (Signed)
Date of Admission: 02/19/2017  Date of Surgery: 02/19/2017  Pre-Op Dx: Cataract Left  Eye  Post-Op Dx: Senile Nuclear Cataract  Left  Eye,  Dx Code H25.12  Surgeon: Tonny Branch, M.D.  Assistants: None  Anesthesia: Topical with MAC  Indications: Painless, progressive loss of vision with compromise of daily activities.  Surgery: Cataract Extraction with Intraocular lens Implant Left Eye  Discription: The patient had dilating drops and viscous lidocaine placed into the Left eye in the pre-op holding area. After transfer to the operating room, a time out was performed. The patient was then prepped and draped. Beginning with a 87m blade a paracentesis port was made at the surgeon's 2 o'clock position. The anterior chamber was then filled with 1% non-preserved lidocaine. This was followed by filling the anterior chamber with Provisc.  A 2.423mkeratome blade was used to make a clear corneal incision at the temporal limbus.  A bent cystatome needle was used to create a continuous tear capsulotomy. Hydrodissection was performed with balanced salt solution on a Fine canula. The lens nucleus was then removed using the phacoemulsification handpiece. Residual cortex was removed with the I&A handpiece. The anterior chamber and capsular bag were refilled with Provisc. A posterior chamber intraocular lens was placed into the capsular bag with it's injector. The implant was positioned with the Kuglan hook. The Provisc was then removed from the anterior chamber and capsular bag with the I&A handpiece. Stromal hydration of the main incision and paracentesis port was performed with BSS on a Fine canula. The wounds were tested for leak which was negative. The patient tolerated the procedure well. There were no operative complications. The patient was then transferred to the recovery room in stable condition.  Complications: None  Specimen: None  EBL: None  Prosthetic device: [employer], PCB00, power 22.5, SN  [id number]

## 2017-02-19 NOTE — Anesthesia Postprocedure Evaluation (Signed)
Anesthesia Post Note  Patient: [name]  Procedure(s) Performed: CATARACT EXTRACTION PHACO AND INTRAOCULAR LENS PLACEMENT LEFT EYE (Left Eye)  Patient location during evaluation: Short Stay Anesthesia Type: MAC Level of consciousness: awake and alert, oriented and patient cooperative Pain management: pain level controlled Vital Signs Assessment: post-procedure vital signs reviewed and stable Respiratory status: spontaneous breathing and respiratory function stable Cardiovascular status: stable Postop Assessment: no apparent nausea or vomiting Anesthetic complications: no     Last Vitals:  Vitals:   02/19/17 1110 02/19/17 1115  BP: 114/62 (!) 107/59  Pulse:    Resp: 15 (!) 28  Temp:    SpO2: 95% 96%    Last Pain:  Vitals:   02/19/17 1102  TempSrc: Oral                 Keila Turan A

## 2017-02-19 NOTE — Anesthesia Procedure Notes (Signed)
Procedure Name: MAC Date/Time: 02/19/2017 11:17 AM Performed by: Andree Elk Radha Coggins A, CRNA Pre-anesthesia Checklist: Patient identified, Timeout performed, Emergency Drugs available, Suction available and Patient being monitored Oxygen Delivery Method: Nasal cannula

## 2017-02-19 NOTE — Transfer of Care (Signed)
Immediate Anesthesia Transfer of Care Note  Patient: [name]  Procedure(s) Performed: CATARACT EXTRACTION PHACO AND INTRAOCULAR LENS PLACEMENT LEFT EYE (Left Eye)  Patient Location: Short Stay  Anesthesia Type:MAC  Level of Consciousness: awake, alert , oriented and patient cooperative  Airway & Oxygen Therapy: Patient Spontanous Breathing  Post-op Assessment: Report given to RN and Post -op Vital signs reviewed and stable  Post vital signs: Reviewed and stable  Last Vitals:  Vitals:   02/19/17 1110 02/19/17 1115  BP: 114/62 (!) 107/59  Pulse:    Resp: 15 (!) 28  Temp:    SpO2: 95% 96%    Last Pain:  Vitals:   02/19/17 1102  TempSrc: Oral      Patients Stated Pain Goal: 4 (09/81/19 1478)  Complications: No apparent anesthesia complications

## 2017-02-19 NOTE — Anesthesia Preprocedure Evaluation (Signed)
Anesthesia Evaluation  Patient identified by MRN, date of birth, ID band Patient awake    Airway Mallampati: I  TM Distance: >3 FB Neck ROM: Full    Dental  (+) Partial Upper, Poor Dentition   Pulmonary sleep apnea , COPD, former smoker,           Cardiovascular Exercise Tolerance: Poor hypertension, Pt. on medications and Pt. on home beta blockers + Past MI (2008, stents, CABG 2010 no problems since, no CP/SOB)   Rhythm:Regular Rate:Normal  - Left ventricle: The cavity size was normal. Wall thickness was   normal. Systolic function was severely reduced. The estimated   ejection fraction was in the range of 25% to 30%   (2017)  Results for ARY, LAVINE (MRN [id number]) as of 02/19/2017 10:44  05/11/2016 20:10  08/21/2016 10:51 Battery Remaining Longevity: 130  08/21/2016 10:51  11/20/2016 07:34 Battery Remaining Longevity: 128      Neuro/Psych    GI/Hepatic   Endo/Other    Renal/GU      Musculoskeletal   Abdominal   Peds  Hematology   Anesthesia Other Findings   Reproductive/Obstetrics                             Anesthesia Physical Anesthesia Plan  ASA: IV  Anesthesia Plan: MAC   Post-op Pain Management:    Induction:   PONV Risk Score and Plan:   Airway Management Planned: Nasal Cannula  Additional Equipment:   Intra-op Plan:   Post-operative Plan:   Informed Consent: I have reviewed the patients History and Physical, chart, labs and discussed the procedure including the risks, benefits and alternatives for the proposed anesthesia with the patient or authorized representative who has indicated his/her understanding and acceptance.   Dental advisory given  Plan Discussed with: CRNA  Anesthesia Plan Comments:         Anesthesia Quick Evaluation

## 2017-02-19 NOTE — H&P (Signed)
I have reviewed the H&P, the patient was re-examined, and I have identified no interval changes in medical condition and plan of care since the history and physical of record  

## 2017-02-19 NOTE — Discharge Instructions (Signed)

## 2017-02-19 NOTE — Progress Notes (Signed)
Remote ICD transmission.   

## 2017-02-20 ENCOUNTER — Encounter (HOSPITAL_COMMUNITY): Payer: Self-pay | Admitting: Ophthalmology

## 2017-02-21 LAB — CUP PACEART REMOTE DEVICE CHECK
Brady Statistic RV Percent Paced: 0.16 %
Date Time Interrogation Session: 20190128072503
HighPow Impedance: 44 Ohm
HighPow Impedance: 58 Ohm
Implantable Lead Implant Date: 20080624
Implantable Lead Location: 753860
Lead Channel Impedance Value: 456 Ohm
Lead Channel Pacing Threshold Amplitude: 0.375 V
Lead Channel Sensing Intrinsic Amplitude: 4.5 mV
Lead Channel Setting Sensing Sensitivity: 0.9 mV
MDC IDC MSMT BATTERY REMAINING LONGEVITY: 126 mo
MDC IDC MSMT BATTERY VOLTAGE: 3.01 V
MDC IDC MSMT LEADCHNL RV IMPEDANCE VALUE: 475 Ohm
MDC IDC MSMT LEADCHNL RV PACING THRESHOLD PULSEWIDTH: 0.4 ms
MDC IDC MSMT LEADCHNL RV SENSING INTR AMPL: 4.5 mV
MDC IDC PG IMPLANT DT: 20170714
MDC IDC SET LEADCHNL RV PACING AMPLITUDE: 2.5 V
MDC IDC SET LEADCHNL RV PACING PULSEWIDTH: 0.4 ms

## 2017-02-22 ENCOUNTER — Encounter: Payer: Self-pay | Admitting: Cardiology

## 2017-04-17 ENCOUNTER — Telehealth: Payer: Self-pay | Admitting: Cardiology

## 2017-04-17 NOTE — Telephone Encounter (Signed)
Per phone call from pt's daughter-- pt is having a low heart rate, please give him a call [phone]

## 2017-04-17 NOTE — Telephone Encounter (Signed)
Pt reports that his HR is 45-47 at rest. And in the 50's when active with pulse ox. Pt does report being tired but says he may not be getting enough rest. Pt c/o SOB when working hard. Pt denies CP, and dizziness at this time.

## 2017-04-18 MED ORDER — CARVEDILOL 6.25 MG PO TABS: 6.2500 mg | ORAL_TABLET | Freq: Two times a day (BID) | ORAL | 3 refills | Status: DC

## 2017-04-18 NOTE — Telephone Encounter (Signed)
Please call pt at this [phone]

## 2017-04-18 NOTE — Telephone Encounter (Signed)
Lower coreg to 6.25mg  bid and update [place] in 1 week on heart rates    Zandra Abts MD

## 2017-04-18 NOTE — Telephone Encounter (Signed)
Pt notified and voiced understanding 

## 2017-04-24 ENCOUNTER — Encounter: Payer: Self-pay | Admitting: *Deleted

## 2017-04-24 ENCOUNTER — Telehealth: Payer: Self-pay | Admitting: Cardiology

## 2017-04-24 DIAGNOSIS — I959 Hypotension, unspecified: Secondary | ICD-10-CM | POA: Diagnosis not present

## 2017-04-24 DIAGNOSIS — Z6826 Body mass index (BMI) 26.0-26.9, adult: Secondary | ICD-10-CM | POA: Diagnosis not present

## 2017-04-24 DIAGNOSIS — J22 Unspecified acute lower respiratory infection: Secondary | ICD-10-CM | POA: Diagnosis not present

## 2017-04-24 DIAGNOSIS — E663 Overweight: Secondary | ICD-10-CM | POA: Diagnosis not present

## 2017-04-24 NOTE — Telephone Encounter (Signed)
With his weakened heart muscle it is not uncommon to see bp's 90s/60s, though these are somewhat lower than what he has had before. We generally accept these bp's if the patient is not having symptom s(lightheadedness/dizziness) because the medicines that lower the bp are also very good for his heart. If he is asymptomatic I would not make any changes. If symptoms I would lower his coreg further to 3.125mg  bid. What have his heart rates been?   Zandra Abts MD

## 2017-04-24 NOTE — Telephone Encounter (Signed)
Pt returned call to report BP recordings. Pt reports AM BP recordings are 90/60 in the left arm and 98/60 in the right. Pt reports BP at 1:30 pm as 102/64 in the left arm. Pt was seen by PCP today and office note has been requested.

## 2017-04-24 NOTE — Telephone Encounter (Signed)
Pt states that he was dizzy on Sunday and fell back on his head, back and bottom. Pt reports HR this am of 62 and this afternoon in the high 50's. Please advise.

## 2017-04-24 NOTE — Telephone Encounter (Signed)
BP was 90/60's today at pcp from what he can remember, he is going to get batteries for his machine, retake BP and call me back

## 2017-04-24 NOTE — Telephone Encounter (Signed)
Pt had a low BP reading this morning at his PCP office, wants to know if he should stop one of his meds.  please give him a call.

## 2017-04-25 MED ORDER — CARVEDILOL 3.125 MG PO TABS: 3.1250 mg | ORAL_TABLET | Freq: Two times a day (BID) | ORAL | 3 refills | Status: DC

## 2017-04-25 NOTE — Addendum Note (Signed)
Addended by: Levonne Hubert on: 04/25/2017 01:44 PM   Modules accepted: Orders

## 2017-04-25 NOTE — Telephone Encounter (Signed)
Patient notified. appt made

## 2017-04-25 NOTE — Telephone Encounter (Signed)
Can he lower coreg to 3.125mg  bid and see me Friday at 940am?   Zandra Abts MD

## 2017-04-26 ENCOUNTER — Other Ambulatory Visit: Payer: Self-pay | Admitting: Cardiology

## 2017-04-27 ENCOUNTER — Encounter: Payer: Self-pay | Admitting: Cardiology

## 2017-04-27 ENCOUNTER — Ambulatory Visit (INDEPENDENT_AMBULATORY_CARE_PROVIDER_SITE_OTHER): Payer: Medicare Other | Admitting: Cardiology

## 2017-04-27 VITALS — BP 105/65 | HR 52 | Ht 71.0 in | Wt 193.0 lb

## 2017-04-27 DIAGNOSIS — R55 Syncope and collapse: Secondary | ICD-10-CM | POA: Diagnosis not present

## 2017-04-27 DIAGNOSIS — I2510 Atherosclerotic heart disease of native coronary artery without angina pectoris: Secondary | ICD-10-CM

## 2017-04-27 DIAGNOSIS — I255 Ischemic cardiomyopathy: Secondary | ICD-10-CM

## 2017-04-27 DIAGNOSIS — R001 Bradycardia, unspecified: Secondary | ICD-10-CM | POA: Diagnosis not present

## 2017-04-27 DIAGNOSIS — I5022 Chronic systolic (congestive) heart failure: Secondary | ICD-10-CM | POA: Diagnosis not present

## 2017-04-27 MED ORDER — CARVEDILOL 3.125 MG PO TABS: ORAL_TABLET | ORAL | 3 refills | Status: DC

## 2017-04-27 MED ORDER — CARVEDILOL 3.125 MG PO TABS: 3.1250 mg | ORAL_TABLET | Freq: Two times a day (BID) | ORAL | 3 refills | Status: DC

## 2017-04-27 NOTE — Patient Instructions (Signed)
Your physician recommends that you schedule a follow-up appointment in: 3 months with Dr Harlow Asa Coreg for now    Dr Lovena Le will call you soon regarding your ICD     No lab work or tests today.      Thank you for choosing [facility] !
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
## 2017-04-29 ENCOUNTER — Encounter: Payer: Self-pay | Admitting: Cardiology

## 2017-05-07 ENCOUNTER — Telehealth: Payer: Self-pay

## 2017-05-07 NOTE — Telephone Encounter (Signed)
Called pt several time and left messages for him to return call. Mailed letter.

## 2017-05-07 NOTE — Telephone Encounter (Signed)
-----   Message from Arnoldo Lenis, MD sent at 05/02/2017 12:41 PM EDT ----- Please let patient know that I heard back from Dr Lovena Le, he does not recommend increasing the ventricular pacing rate at this time as it could lead to less synchrony with his heart beat, recommends following his heart rates off the coreg for now   Zandra Abts MD ----- Message ----- From: Evans Lance, MD Sent: 05/01/2017   5:13 PM To: Arnoldo Lenis, MD  If we turn his rate up he will be ventricular pacing from the RV, and develop worsening dysynchrony. Only good option if continues to pace is to place an atrial lead but this would require a new device as well. Lets see how much pacing he does with stopping all of his HR slowing meds. I would not increase the ventricular pacing rate. GT ----- Message ----- From: Arnoldo Lenis, MD Sent: 04/29/2017   2:26 PM To: Evans Lance, MD  Marya Amsler, mutual patient of ours. He has been having bradycardia on his beta blocker even with downtitration, I stopped on Friday. EKG with underlying conduction disease. Is it possible to increase the back up rate on his ICD so I can restart his beta blocker, currently back up rate is at 40. Also he had presyncope I think from bradycardia, but can he have a device check this week as well   Roderic Palau

## 2017-05-21 ENCOUNTER — Ambulatory Visit (INDEPENDENT_AMBULATORY_CARE_PROVIDER_SITE_OTHER): Payer: Medicare Other | Admitting: *Deleted

## 2017-05-21 DIAGNOSIS — I255 Ischemic cardiomyopathy: Secondary | ICD-10-CM

## 2017-05-21 DIAGNOSIS — I5022 Chronic systolic (congestive) heart failure: Secondary | ICD-10-CM

## 2017-05-21 NOTE — Progress Notes (Signed)
Remote ICD transmission.   

## 2017-05-22 ENCOUNTER — Encounter: Payer: Self-pay | Admitting: Cardiology

## 2017-05-23 LAB — CUP PACEART REMOTE DEVICE CHECK
Brady Statistic RV Percent Paced: 0.2 %
Date Time Interrogation Session: 20190429083724
HIGH POWER IMPEDANCE MEASURED VALUE: 47 Ohm
HighPow Impedance: 59 Ohm
Implantable Lead Implant Date: 20080624
Lead Channel Pacing Threshold Amplitude: 0.375 V
Lead Channel Pacing Threshold Pulse Width: 0.4 ms
Lead Channel Sensing Intrinsic Amplitude: 5.375 mV
Lead Channel Sensing Intrinsic Amplitude: 5.375 mV
Lead Channel Setting Pacing Pulse Width: 0.4 ms
Lead Channel Setting Sensing Sensitivity: 0.9 mV
MDC IDC LEAD LOCATION: 753860
MDC IDC MSMT BATTERY REMAINING LONGEVITY: 125 mo
MDC IDC MSMT BATTERY VOLTAGE: 3.01 V
MDC IDC MSMT LEADCHNL RV IMPEDANCE VALUE: 418 Ohm
MDC IDC MSMT LEADCHNL RV IMPEDANCE VALUE: 475 Ohm
MDC IDC PG IMPLANT DT: 20170714
MDC IDC SET LEADCHNL RV PACING AMPLITUDE: 2.5 V

## 2017-06-05 ENCOUNTER — Telehealth: Payer: Self-pay

## 2017-06-05 NOTE — Telephone Encounter (Signed)
Daughter called to say patient has had dizzy spells intermittently and fell the other day.She notes he is more SOB.Over the past week his right arm felt numb.No facial weakness, speech problems . He is now caring for wife with dementia and it has been hard on the family.daughter concerned he has no energy.Offered next available apt, 06/06/17 at 130 pm with M.lenze PA-C

## 2017-06-06 ENCOUNTER — Ambulatory Visit (HOSPITAL_COMMUNITY)
Admission: RE | Admit: 2017-06-06 | Discharge: 2017-06-06 | Disposition: A | Discharge status: Home / Self Care | Payer: Medicare Other | Source: Ambulatory Visit | Attending: Physician Assistant | Admitting: Physician Assistant

## 2017-06-06 ENCOUNTER — Encounter: Payer: Self-pay | Admitting: Physician Assistant

## 2017-06-06 ENCOUNTER — Ambulatory Visit (INDEPENDENT_AMBULATORY_CARE_PROVIDER_SITE_OTHER): Payer: Medicare Other | Admitting: Physician Assistant

## 2017-06-06 VITALS — BP 140/80 | HR 51 | Ht 71.0 in | Wt 191.0 lb

## 2017-06-06 DIAGNOSIS — R001 Bradycardia, unspecified: Secondary | ICD-10-CM | POA: Diagnosis not present

## 2017-06-06 DIAGNOSIS — R42 Dizziness and giddiness: Secondary | ICD-10-CM | POA: Diagnosis not present

## 2017-06-06 DIAGNOSIS — M5030 Other cervical disc degeneration, unspecified cervical region: Secondary | ICD-10-CM | POA: Diagnosis not present

## 2017-06-06 DIAGNOSIS — I255 Ischemic cardiomyopathy: Secondary | ICD-10-CM

## 2017-06-06 DIAGNOSIS — I5022 Chronic systolic (congestive) heart failure: Secondary | ICD-10-CM | POA: Diagnosis not present

## 2017-06-06 DIAGNOSIS — M4802 Spinal stenosis, cervical region: Secondary | ICD-10-CM | POA: Diagnosis not present

## 2017-06-06 DIAGNOSIS — R200 Anesthesia of skin: Secondary | ICD-10-CM | POA: Insufficient documentation

## 2017-06-06 NOTE — Patient Instructions (Signed)
Medication Instructions:  Your physician recommends that you continue on your current medications as directed. Please refer to the Current Medication list given to you today.   Labwork: NONE   Testing/Procedures:  Follow-Up: Your physician recommends that you schedule a follow-up appointment in: July with Dr. Harl Bowie.    Any Other Special Instructions Will Be Listed Below (If Applicable).     If you need a refill on your cardiac medications before your next appointment, please call your pharmacy. Thank you for choosing [facility]!
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
## 2017-08-07 DIAGNOSIS — Z6827 Body mass index (BMI) 27.0-27.9, adult: Secondary | ICD-10-CM | POA: Diagnosis not present

## 2017-08-07 DIAGNOSIS — Z0001 Encounter for general adult medical examination with abnormal findings: Secondary | ICD-10-CM | POA: Diagnosis not present

## 2017-08-07 DIAGNOSIS — Z1389 Encounter for screening for other disorder: Secondary | ICD-10-CM | POA: Diagnosis not present

## 2017-08-07 DIAGNOSIS — E663 Overweight: Secondary | ICD-10-CM | POA: Diagnosis not present

## 2017-08-09 ENCOUNTER — Ambulatory Visit (INDEPENDENT_AMBULATORY_CARE_PROVIDER_SITE_OTHER): Payer: Medicare Other | Admitting: Cardiology

## 2017-08-09 ENCOUNTER — Encounter: Payer: Self-pay | Admitting: Cardiology

## 2017-08-09 VITALS — BP 124/82 | HR 57 | Ht 71.0 in | Wt 195.0 lb

## 2017-08-09 DIAGNOSIS — R001 Bradycardia, unspecified: Secondary | ICD-10-CM | POA: Diagnosis not present

## 2017-08-09 DIAGNOSIS — I255 Ischemic cardiomyopathy: Secondary | ICD-10-CM | POA: Diagnosis not present

## 2017-08-09 DIAGNOSIS — I10 Essential (primary) hypertension: Secondary | ICD-10-CM | POA: Diagnosis not present

## 2017-08-09 DIAGNOSIS — E785 Hyperlipidemia, unspecified: Secondary | ICD-10-CM | POA: Diagnosis not present

## 2017-08-09 DIAGNOSIS — R7309 Other abnormal glucose: Secondary | ICD-10-CM
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
## 2017-08-09 NOTE — Patient Instructions (Signed)
Medication Instructions:  Your physician recommends that you continue on your current medications as directed. Please refer to the Current Medication list given to you today.   Labwork: today  Testing/Procedures: none  Follow-Up: Your physician recommends that you schedule a follow-up appointment in: 4 months    Any Other Special Instructions Will Be Listed Below (If Applicable).     If you need a refill on your cardiac medications before your next appointment, please call your pharmacy.

## 2017-08-10 ENCOUNTER — Other Ambulatory Visit (HOSPITAL_COMMUNITY)
Admission: RE | Admit: 2017-08-10 | Discharge: 2017-08-10 | Disposition: A | Discharge status: Home / Self Care | Payer: Medicare Other | Source: Ambulatory Visit | Attending: Cardiology | Admitting: Cardiology

## 2017-08-10 DIAGNOSIS — I5043 Acute on chronic combined systolic (congestive) and diastolic (congestive) heart failure: Secondary | ICD-10-CM | POA: Insufficient documentation

## 2017-08-10 DIAGNOSIS — R002 Palpitations: Secondary | ICD-10-CM | POA: Insufficient documentation

## 2017-08-10 DIAGNOSIS — R7309 Other abnormal glucose: Secondary | ICD-10-CM | POA: Insufficient documentation

## 2017-08-10 DIAGNOSIS — E785 Hyperlipidemia, unspecified: Secondary | ICD-10-CM | POA: Diagnosis not present

## 2017-08-10 LAB — CBC WITH DIFFERENTIAL/PLATELET
BASOS ABS: 0 10*3/uL (ref 0.0–0.1)
BASOS PCT: 0 %
Eosinophils Absolute: 0.3 10*3/uL (ref 0.0–0.7)
Eosinophils Relative: 4 %
HEMATOCRIT: 45.2 % (ref 39.0–52.0)
Hemoglobin: 15.1 g/dL (ref 13.0–17.0)
Lymphocytes Relative: 20 %
Lymphs Abs: 1.5 10*3/uL (ref 0.7–4.0)
MCH: 32.6 pg (ref 26.0–34.0)
MCHC: 33.4 g/dL (ref 30.0–36.0)
MCV: 97.6 fL (ref 78.0–100.0)
MONO ABS: 0.5 10*3/uL (ref 0.1–1.0)
Monocytes Relative: 7 %
NEUTROS ABS: 5.2 10*3/uL (ref 1.7–7.7)
NEUTROS PCT: 69 %
Platelets: 150 10*3/uL (ref 150–400)
RBC: 4.63 MIL/uL (ref 4.22–5.81)
RDW: 13.2 % (ref 11.5–15.5)
WBC: 7.5 10*3/uL (ref 4.0–10.5)

## 2017-08-10 LAB — LIPID PANEL
CHOLESTEROL: 74 mg/dL (ref 0–200)
HDL: 36 mg/dL — ABNORMAL LOW (ref >40)
LDL Cholesterol: 32 mg/dL (ref 0–99)
TRIGLYCERIDES: 32 mg/dL (ref <150)
Total CHOL/HDL Ratio: 2.1 RATIO
VLDL: 6 mg/dL (ref 0–40)

## 2017-08-10 LAB — COMPREHENSIVE METABOLIC PANEL
ALT: 41 U/L (ref 0–44)
AST: 39 U/L (ref 15–41)
Albumin: 3.9 g/dL (ref 3.5–5.0)
Alkaline Phosphatase: 65 U/L (ref 38–126)
Anion gap: 7 (ref 5–15)
BILIRUBIN TOTAL: 0.8 mg/dL (ref 0.3–1.2)
BUN: 14 mg/dL (ref 8–23)
CO2: 28 mmol/L (ref 22–32)
CREATININE: 0.94 mg/dL (ref 0.61–1.24)
Calcium: 9 mg/dL (ref 8.9–10.3)
Chloride: 106 mmol/L (ref 98–111)
GFR calc Af Amer: >60 mL/min (ref >60)
Glucose, Bld: 117 mg/dL — ABNORMAL HIGH (ref 70–99)
POTASSIUM: 4.3 mmol/L (ref 3.5–5.1)
Sodium: 141 mmol/L (ref 135–145)
TOTAL PROTEIN: 6.9 g/dL (ref 6.5–8.1)

## 2017-08-10 LAB — MAGNESIUM: Magnesium: 1.9 mg/dL (ref 1.7–2.4)

## 2017-08-10 LAB — TSH: TSH: 1.728 u[IU]/mL (ref 0.350–4.500)

## 2017-08-10 LAB — HEMOGLOBIN A1C
HEMOGLOBIN A1C: 6.2 % — AB (ref 4.8–5.6)
Mean Plasma Glucose: 131.24 mg/dL

## 2017-08-20 ENCOUNTER — Ambulatory Visit (INDEPENDENT_AMBULATORY_CARE_PROVIDER_SITE_OTHER): Payer: Medicare Other | Admitting: *Deleted

## 2017-08-20 ENCOUNTER — Encounter: Payer: Self-pay | Admitting: Cardiology

## 2017-08-20 DIAGNOSIS — I5022 Chronic systolic (congestive) heart failure: Secondary | ICD-10-CM

## 2017-08-20 DIAGNOSIS — I255 Ischemic cardiomyopathy: Secondary | ICD-10-CM | POA: Diagnosis not present

## 2017-08-20 NOTE — Progress Notes (Signed)
Remote ICD transmission.   

## 2017-08-21 ENCOUNTER — Encounter: Payer: Self-pay | Admitting: Cardiology

## 2017-08-24 LAB — CUP PACEART REMOTE DEVICE CHECK
Battery Remaining Longevity: 123 mo
Battery Voltage: 3 V
Brady Statistic RV Percent Paced: 0.02 %
HIGH POWER IMPEDANCE MEASURED VALUE: 46 Ohm
HighPow Impedance: 56 Ohm
Implantable Lead Implant Date: 20080624
Implantable Lead Model: 6947
Lead Channel Impedance Value: 399 Ohm
Lead Channel Impedance Value: 475 Ohm
Lead Channel Sensing Intrinsic Amplitude: 4.625 mV
Lead Channel Setting Pacing Amplitude: 2.5 V
MDC IDC LEAD LOCATION: 753860
MDC IDC MSMT LEADCHNL RV PACING THRESHOLD AMPLITUDE: 0.375 V
MDC IDC MSMT LEADCHNL RV PACING THRESHOLD PULSEWIDTH: 0.4 ms
MDC IDC MSMT LEADCHNL RV SENSING INTR AMPL: 4.625 mV
MDC IDC PG IMPLANT DT: 20170714
MDC IDC SESS DTM: 20190729083824
MDC IDC SET LEADCHNL RV PACING PULSEWIDTH: 0.4 ms
MDC IDC SET LEADCHNL RV SENSING SENSITIVITY: 0.9 mV

## 2017-09-10 ENCOUNTER — Encounter: Payer: Self-pay | Admitting: Internal Medicine

## 2017-09-10 ENCOUNTER — Ambulatory Visit (INDEPENDENT_AMBULATORY_CARE_PROVIDER_SITE_OTHER): Payer: Medicare Other | Admitting: Internal Medicine

## 2017-09-10 VITALS — BP 134/70 | HR 50 | Ht 71.0 in | Wt 197.0 lb

## 2017-09-10 DIAGNOSIS — I255 Ischemic cardiomyopathy: Secondary | ICD-10-CM

## 2017-09-10 DIAGNOSIS — I5022 Chronic systolic (congestive) heart failure: Secondary | ICD-10-CM

## 2017-09-10 LAB — CUP PACEART INCLINIC DEVICE CHECK
Battery Remaining Longevity: 122 mo
Battery Voltage: 3.01 V
HighPow Impedance: 45 Ohm
HighPow Impedance: 61 Ohm
Implantable Lead Implant Date: 20080624
Implantable Lead Location: 753860
Implantable Lead Model: 6947
Implantable Pulse Generator Implant Date: 20170714
Lead Channel Impedance Value: 475 Ohm
Lead Channel Pacing Threshold Amplitude: 0.5 V
Lead Channel Pacing Threshold Pulse Width: 0.4 ms
Lead Channel Setting Pacing Amplitude: 2.5 V
Lead Channel Setting Pacing Pulse Width: 0.4 ms
MDC IDC MSMT LEADCHNL RV IMPEDANCE VALUE: 456 Ohm
MDC IDC MSMT LEADCHNL RV SENSING INTR AMPL: 5 mV
MDC IDC MSMT LEADCHNL RV SENSING INTR AMPL: 5.125 mV
MDC IDC SESS DTM: 20190819142040
MDC IDC SET LEADCHNL RV SENSING SENSITIVITY: 0.9 mV
MDC IDC STAT BRADY RV PERCENT PACED: 0.13 %
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
## 2017-09-10 NOTE — Patient Instructions (Signed)
Medication Instructions:  Your physician recommends that you continue on your current medications as directed. Please refer to the Current Medication list given to you today.   Labwork: NONE   Testing/Procedures: NONE   Follow-Up: Your physician wants you to follow-up in: 1 Year with Dr. Taylor. You will receive a reminder letter in the mail two months in advance. If you don't receive a letter, please call our office to schedule the follow-up appointment.   Any Other Special Instructions Will Be Listed Below (If Applicable).     If you need a refill on your cardiac medications before your next appointment, please call your pharmacy.  Thank you for choosing [facility]!   

## 2017-11-19 ENCOUNTER — Ambulatory Visit (INDEPENDENT_AMBULATORY_CARE_PROVIDER_SITE_OTHER): Payer: Medicare Other | Admitting: *Deleted

## 2017-11-19 DIAGNOSIS — I5022 Chronic systolic (congestive) heart failure: Secondary | ICD-10-CM | POA: Diagnosis not present

## 2017-11-19 DIAGNOSIS — I255 Ischemic cardiomyopathy: Secondary | ICD-10-CM

## 2017-11-19 NOTE — Progress Notes (Signed)
Remote ICD transmission.   

## 2017-12-03 ENCOUNTER — Encounter: Payer: Self-pay | Admitting: Cardiology

## 2017-12-03 ENCOUNTER — Ambulatory Visit (INDEPENDENT_AMBULATORY_CARE_PROVIDER_SITE_OTHER): Payer: Medicare Other | Admitting: Cardiology

## 2017-12-03 VITALS — BP 128/64 | HR 47 | Ht 71.0 in | Wt 195.6 lb

## 2017-12-03 DIAGNOSIS — R001 Bradycardia, unspecified: Secondary | ICD-10-CM | POA: Diagnosis not present

## 2017-12-03 DIAGNOSIS — E782 Mixed hyperlipidemia: Secondary | ICD-10-CM | POA: Diagnosis not present

## 2017-12-03 DIAGNOSIS — I2510 Atherosclerotic heart disease of native coronary artery without angina pectoris: Secondary | ICD-10-CM | POA: Diagnosis not present

## 2017-12-03 DIAGNOSIS — I5022 Chronic systolic (congestive) heart failure: Secondary | ICD-10-CM | POA: Diagnosis not present

## 2017-12-03 DIAGNOSIS — I255 Ischemic cardiomyopathy: Secondary | ICD-10-CM
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
## 2017-12-03 NOTE — Patient Instructions (Signed)
Medication Instructions:  Your physician recommends that you continue on your current medications as directed. Please refer to the Current Medication list given to you today.  If you need a refill on your cardiac medications before your next appointment, please call your pharmacy.   Lab work: None If you have labs (blood work) drawn today and your tests are completely normal, you will receive your results only by: Marland Kitchen MyChart Message (if you have MyChart) OR . A paper copy in the mail If you have any lab test that is abnormal or we need to change your treatment, we will call you to review the results.  Testing/Procedures: None  Follow-Up: At [facility], you and your health needs are our priority.  As part of our continuing mission to provide you with exceptional heart care, we have created designated Provider Care Teams.  These Care Teams include your primary Cardiologist (physician) and Advanced Practice Providers (APPs -  Physician Assistants and Nurse Practitioners) who all work together to provide you with the care you need, when you need it. You will need a follow up appointment in 6 months.  Please call our office 2 months in advance to schedule this appointment.  You may see Carlyle Dolly, MD or one of the following Advanced Practice Providers on your designated Care Team:   Bernerd Pho, PA-C [facility]) . Ermalinda Barrios, PA-C ([place])  Any Other Special Instructions Will Be Listed Below (If Applicable). None

## 2017-12-12 DIAGNOSIS — Z23 Encounter for immunization: Secondary | ICD-10-CM | POA: Diagnosis not present

## 2018-01-14 LAB — CUP PACEART REMOTE DEVICE CHECK
Battery Voltage: 2.99 V
Brady Statistic RV Percent Paced: 0.05 %
Date Time Interrogation Session: 20191028073326
HIGH POWER IMPEDANCE MEASURED VALUE: 44 Ohm
HIGH POWER IMPEDANCE MEASURED VALUE: 54 Ohm
Implantable Lead Model: 6947
Lead Channel Impedance Value: 399 Ohm
Lead Channel Impedance Value: 456 Ohm
Lead Channel Sensing Intrinsic Amplitude: 5.25 mV
Lead Channel Setting Sensing Sensitivity: 0.9 mV
MDC IDC LEAD IMPLANT DT: 20080624
MDC IDC LEAD LOCATION: 753860
MDC IDC MSMT BATTERY REMAINING LONGEVITY: 121 mo
MDC IDC MSMT LEADCHNL RV PACING THRESHOLD AMPLITUDE: 0.375 V
MDC IDC MSMT LEADCHNL RV PACING THRESHOLD PULSEWIDTH: 0.4 ms
MDC IDC MSMT LEADCHNL RV SENSING INTR AMPL: 5.25 mV
MDC IDC PG IMPLANT DT: 20170714
MDC IDC SET LEADCHNL RV PACING AMPLITUDE: 2.5 V
MDC IDC SET LEADCHNL RV PACING PULSEWIDTH: 0.4 ms

## 2018-01-28 ENCOUNTER — Encounter (HOSPITAL_COMMUNITY): Payer: Self-pay | Admitting: Emergency Medicine

## 2018-01-28 ENCOUNTER — Other Ambulatory Visit: Payer: Self-pay

## 2018-01-28 ENCOUNTER — Emergency Department (HOSPITAL_COMMUNITY): Payer: Medicare Other

## 2018-01-28 ENCOUNTER — Inpatient Hospital Stay (HOSPITAL_COMMUNITY)
Admission: EM | Admit: 2018-01-28 | Discharge: 2018-01-31 | DRG: 439 | Disposition: A | Discharge status: Home / Self Care | Payer: Medicare Other | Attending: Internal Medicine | Admitting: Internal Medicine

## 2018-01-28 DIAGNOSIS — K8689 Other specified diseases of pancreas: Secondary | ICD-10-CM

## 2018-01-28 DIAGNOSIS — I5042 Chronic combined systolic (congestive) and diastolic (congestive) heart failure: Secondary | ICD-10-CM

## 2018-01-28 DIAGNOSIS — I10 Essential (primary) hypertension: Secondary | ICD-10-CM | POA: Diagnosis present

## 2018-01-28 DIAGNOSIS — J449 Chronic obstructive pulmonary disease, unspecified: Secondary | ICD-10-CM | POA: Diagnosis present

## 2018-01-28 DIAGNOSIS — I252 Old myocardial infarction: Secondary | ICD-10-CM

## 2018-01-28 DIAGNOSIS — K859 Acute pancreatitis without necrosis or infection, unspecified: Principal | ICD-10-CM | POA: Diagnosis present

## 2018-01-28 DIAGNOSIS — E785 Hyperlipidemia, unspecified: Secondary | ICD-10-CM | POA: Diagnosis not present

## 2018-01-28 DIAGNOSIS — Z8379 Family history of other diseases of the digestive system: Secondary | ICD-10-CM

## 2018-01-28 DIAGNOSIS — Z953 Presence of xenogenic heart valve: Secondary | ICD-10-CM

## 2018-01-28 DIAGNOSIS — I2510 Atherosclerotic heart disease of native coronary artery without angina pectoris: Secondary | ICD-10-CM | POA: Diagnosis present

## 2018-01-28 DIAGNOSIS — I110 Hypertensive heart disease with heart failure: Secondary | ICD-10-CM | POA: Diagnosis present

## 2018-01-28 DIAGNOSIS — Z7982 Long term (current) use of aspirin: Secondary | ICD-10-CM

## 2018-01-28 DIAGNOSIS — Z79899 Other long term (current) drug therapy: Secondary | ICD-10-CM

## 2018-01-28 DIAGNOSIS — G4730 Sleep apnea, unspecified: Secondary | ICD-10-CM | POA: Diagnosis present

## 2018-01-28 DIAGNOSIS — K861 Other chronic pancreatitis: Secondary | ICD-10-CM | POA: Diagnosis not present

## 2018-01-28 DIAGNOSIS — R0602 Shortness of breath: Secondary | ICD-10-CM | POA: Diagnosis not present

## 2018-01-28 DIAGNOSIS — Z9841 Cataract extraction status, right eye: Secondary | ICD-10-CM

## 2018-01-28 DIAGNOSIS — Z9842 Cataract extraction status, left eye: Secondary | ICD-10-CM

## 2018-01-28 DIAGNOSIS — Z95810 Presence of automatic (implantable) cardiac defibrillator: Secondary | ICD-10-CM | POA: Diagnosis present

## 2018-01-28 DIAGNOSIS — Z833 Family history of diabetes mellitus: Secondary | ICD-10-CM

## 2018-01-28 DIAGNOSIS — Z87891 Personal history of nicotine dependence: Secondary | ICD-10-CM

## 2018-01-28 DIAGNOSIS — K4090 Unilateral inguinal hernia, without obstruction or gangrene, not specified as recurrent: Secondary | ICD-10-CM | POA: Diagnosis not present

## 2018-01-28 DIAGNOSIS — I350 Nonrheumatic aortic (valve) stenosis: Secondary | ICD-10-CM | POA: Diagnosis present

## 2018-01-28 DIAGNOSIS — Z951 Presence of aortocoronary bypass graft: Secondary | ICD-10-CM

## 2018-01-28 DIAGNOSIS — Z881 Allergy status to other antibiotic agents status: Secondary | ICD-10-CM

## 2018-01-28 DIAGNOSIS — I255 Ischemic cardiomyopathy: Secondary | ICD-10-CM | POA: Diagnosis present

## 2018-01-28 DIAGNOSIS — Z961 Presence of intraocular lens: Secondary | ICD-10-CM | POA: Diagnosis present

## 2018-01-28 DIAGNOSIS — R079 Chest pain, unspecified: Secondary | ICD-10-CM | POA: Diagnosis not present

## 2018-01-28 DIAGNOSIS — N401 Enlarged prostate with lower urinary tract symptoms: Secondary | ICD-10-CM | POA: Diagnosis present

## 2018-01-28 DIAGNOSIS — Z8249 Family history of ischemic heart disease and other diseases of the circulatory system: Secondary | ICD-10-CM

## 2018-01-28 LAB — PHOSPHORUS: Phosphorus: 2.2 mg/dL — ABNORMAL LOW (ref 2.5–4.6)

## 2018-01-28 LAB — HEPATIC FUNCTION PANEL
ALT: 25 U/L (ref 0–44)
AST: 25 U/L (ref 15–41)
Albumin: 4.2 g/dL (ref 3.5–5.0)
Alkaline Phosphatase: 57 U/L (ref 38–126)
BILIRUBIN DIRECT: 0.1 mg/dL (ref 0.0–0.2)
BILIRUBIN INDIRECT: 0.6 mg/dL (ref 0.3–0.9)
Total Bilirubin: 0.7 mg/dL (ref 0.3–1.2)
Total Protein: 7.1 g/dL (ref 6.5–8.1)

## 2018-01-28 LAB — URINALYSIS, ROUTINE W REFLEX MICROSCOPIC
BILIRUBIN URINE: NEGATIVE
Bacteria, UA: NONE SEEN
GLUCOSE, UA: 50 mg/dL — AB
Hgb urine dipstick: NEGATIVE
Ketones, ur: NEGATIVE mg/dL
Leukocytes, UA: NEGATIVE
Nitrite: NEGATIVE
PH: 8 (ref 5.0–8.0)
Protein, ur: 30 mg/dL — AB
Specific Gravity, Urine: 1.021 (ref 1.005–1.030)

## 2018-01-28 LAB — BASIC METABOLIC PANEL
ANION GAP: 6 (ref 5–15)
BUN: 19 mg/dL (ref 8–23)
CALCIUM: 8.9 mg/dL (ref 8.9–10.3)
CO2: 27 mmol/L (ref 22–32)
Chloride: 106 mmol/L (ref 98–111)
Creatinine, Ser: 0.76 mg/dL (ref 0.61–1.24)
GFR calc Af Amer: >60 mL/min (ref >60)
GLUCOSE: 147 mg/dL — AB (ref 70–99)
Potassium: 3.6 mmol/L (ref 3.5–5.1)
SODIUM: 139 mmol/L (ref 135–145)

## 2018-01-28 LAB — CBC
HCT: 48.2 % (ref 39.0–52.0)
Hemoglobin: 15 g/dL (ref 13.0–17.0)
MCH: 30.3 pg (ref 26.0–34.0)
MCHC: 31.1 g/dL (ref 30.0–36.0)
MCV: 97.4 fL (ref 80.0–100.0)
PLATELETS: 153 10*3/uL (ref 150–400)
RBC: 4.95 MIL/uL (ref 4.22–5.81)
RDW: 13.6 % (ref 11.5–15.5)
WBC: 14.4 10*3/uL — ABNORMAL HIGH (ref 4.0–10.5)
nRBC: 0 % (ref 0.0–0.2)

## 2018-01-28 LAB — MAGNESIUM: Magnesium: 2 mg/dL (ref 1.7–2.4)

## 2018-01-28 LAB — TROPONIN I: Troponin I: <0.03 ng/mL (ref <0.03)

## 2018-01-28 LAB — LIPASE, BLOOD: LIPASE: 26 U/L (ref 11–51)

## 2018-01-28 MED ORDER — ONDANSETRON HCL 4 MG PO TABS: 4.0000 mg | ORAL_TABLET | Freq: Four times a day (QID) | ORAL | Status: DC | PRN

## 2018-01-28 MED ORDER — FENTANYL CITRATE (PF) 100 MCG/2ML IJ SOLN
50.0000 ug | Freq: Once | INTRAMUSCULAR | Status: AC
  Administered 2018-01-28: 50 ug via INTRAVENOUS
  Filled 2018-01-28: qty 2

## 2018-01-28 MED ORDER — IOPAMIDOL (ISOVUE-370) INJECTION 76%
100.0000 mL | Freq: Once | INTRAVENOUS | Status: AC | PRN
  Administered 2018-01-28: 100 mL via INTRAVENOUS

## 2018-01-28 MED ORDER — SODIUM CHLORIDE 0.9 % IV BOLUS
500.0000 mL | Freq: Once | INTRAVENOUS | Status: AC
  Administered 2018-01-28: 500 mL via INTRAVENOUS

## 2018-01-28 MED ORDER — POTASSIUM CHLORIDE IN NACL 20-0.45 MEQ/L-% IV SOLN
INTRAVENOUS | Status: AC
  Administered 2018-01-29: 01:00:00 via INTRAVENOUS
  Filled 2018-01-28 (×3): qty 1000

## 2018-01-28 MED ORDER — ACETAMINOPHEN 650 MG RE SUPP: 650.0000 mg | Freq: Four times a day (QID) | RECTAL | Status: DC | PRN

## 2018-01-28 MED ORDER — HYDROMORPHONE HCL 1 MG/ML IJ SOLN
1.0000 mg | INTRAMUSCULAR | Status: DC | PRN
  Administered 2018-01-29 – 2018-01-30 (×2): 1 mg via INTRAVENOUS
  Filled 2018-01-28 (×2): qty 1

## 2018-01-28 MED ORDER — ONDANSETRON HCL 4 MG/2ML IJ SOLN: 4.0000 mg | Freq: Four times a day (QID) | INTRAMUSCULAR | Status: DC | PRN

## 2018-01-28 MED ORDER — ACETAMINOPHEN 325 MG PO TABS
650.0000 mg | ORAL_TABLET | Freq: Four times a day (QID) | ORAL | Status: DC | PRN
  Administered 2018-01-31: 650 mg via ORAL
  Filled 2018-01-28: qty 2

## 2018-01-28 MED ORDER — ONDANSETRON HCL 4 MG/2ML IJ SOLN
4.0000 mg | Freq: Once | INTRAMUSCULAR | Status: AC
  Administered 2018-01-28: 4 mg via INTRAVENOUS
  Filled 2018-01-28: qty 2

## 2018-01-28 NOTE — ED Triage Notes (Signed)
CP that started in epigastric area earlier today and moved into center of chest
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
## 2018-01-28 NOTE — ED Notes (Signed)
Patient transported to CT 

## 2018-01-29 ENCOUNTER — Observation Stay (HOSPITAL_COMMUNITY): Payer: Medicare Other

## 2018-01-29 ENCOUNTER — Other Ambulatory Visit: Payer: Self-pay

## 2018-01-29 ENCOUNTER — Encounter (HOSPITAL_COMMUNITY): Payer: Self-pay

## 2018-01-29 DIAGNOSIS — Z9842 Cataract extraction status, left eye: Secondary | ICD-10-CM | POA: Diagnosis not present

## 2018-01-29 DIAGNOSIS — K8689 Other specified diseases of pancreas: Secondary | ICD-10-CM | POA: Diagnosis not present

## 2018-01-29 DIAGNOSIS — I255 Ischemic cardiomyopathy: Secondary | ICD-10-CM | POA: Diagnosis present

## 2018-01-29 DIAGNOSIS — E782 Mixed hyperlipidemia: Secondary | ICD-10-CM | POA: Diagnosis not present

## 2018-01-29 DIAGNOSIS — Z9841 Cataract extraction status, right eye: Secondary | ICD-10-CM | POA: Diagnosis not present

## 2018-01-29 DIAGNOSIS — K859 Acute pancreatitis without necrosis or infection, unspecified: Secondary | ICD-10-CM | POA: Diagnosis present

## 2018-01-29 DIAGNOSIS — I350 Nonrheumatic aortic (valve) stenosis: Secondary | ICD-10-CM | POA: Diagnosis present

## 2018-01-29 DIAGNOSIS — N401 Enlarged prostate with lower urinary tract symptoms: Secondary | ICD-10-CM | POA: Diagnosis present

## 2018-01-29 DIAGNOSIS — Z881 Allergy status to other antibiotic agents status: Secondary | ICD-10-CM | POA: Diagnosis not present

## 2018-01-29 DIAGNOSIS — I110 Hypertensive heart disease with heart failure: Secondary | ICD-10-CM | POA: Diagnosis present

## 2018-01-29 DIAGNOSIS — Z953 Presence of xenogenic heart valve: Secondary | ICD-10-CM | POA: Diagnosis not present

## 2018-01-29 DIAGNOSIS — Z79899 Other long term (current) drug therapy: Secondary | ICD-10-CM | POA: Diagnosis not present

## 2018-01-29 DIAGNOSIS — I252 Old myocardial infarction: Secondary | ICD-10-CM | POA: Diagnosis not present

## 2018-01-29 DIAGNOSIS — J449 Chronic obstructive pulmonary disease, unspecified: Secondary | ICD-10-CM | POA: Diagnosis present

## 2018-01-29 DIAGNOSIS — I2510 Atherosclerotic heart disease of native coronary artery without angina pectoris: Secondary | ICD-10-CM | POA: Diagnosis present

## 2018-01-29 DIAGNOSIS — Z87891 Personal history of nicotine dependence: Secondary | ICD-10-CM | POA: Diagnosis not present

## 2018-01-29 DIAGNOSIS — E785 Hyperlipidemia, unspecified: Secondary | ICD-10-CM | POA: Diagnosis present

## 2018-01-29 DIAGNOSIS — K861 Other chronic pancreatitis: Secondary | ICD-10-CM

## 2018-01-29 DIAGNOSIS — I5042 Chronic combined systolic (congestive) and diastolic (congestive) heart failure: Secondary | ICD-10-CM | POA: Diagnosis present

## 2018-01-29 DIAGNOSIS — Z8379 Family history of other diseases of the digestive system: Secondary | ICD-10-CM | POA: Diagnosis not present

## 2018-01-29 DIAGNOSIS — I10 Essential (primary) hypertension: Secondary | ICD-10-CM

## 2018-01-29 DIAGNOSIS — Z8249 Family history of ischemic heart disease and other diseases of the circulatory system: Secondary | ICD-10-CM | POA: Diagnosis not present

## 2018-01-29 DIAGNOSIS — Z951 Presence of aortocoronary bypass graft: Secondary | ICD-10-CM | POA: Diagnosis not present

## 2018-01-29 DIAGNOSIS — Z961 Presence of intraocular lens: Secondary | ICD-10-CM | POA: Diagnosis present

## 2018-01-29 DIAGNOSIS — J410 Simple chronic bronchitis: Secondary | ICD-10-CM | POA: Diagnosis not present

## 2018-01-29 DIAGNOSIS — Z95810 Presence of automatic (implantable) cardiac defibrillator: Secondary | ICD-10-CM | POA: Diagnosis not present

## 2018-01-29 DIAGNOSIS — R079 Chest pain, unspecified: Secondary | ICD-10-CM | POA: Diagnosis not present

## 2018-01-29 DIAGNOSIS — G4730 Sleep apnea, unspecified: Secondary | ICD-10-CM | POA: Diagnosis present

## 2018-01-29 DIAGNOSIS — Z833 Family history of diabetes mellitus: Secondary | ICD-10-CM | POA: Diagnosis not present

## 2018-01-29 LAB — LIPID PANEL
CHOLESTEROL: 73 mg/dL (ref 0–200)
HDL: 39 mg/dL — ABNORMAL LOW (ref >40)
LDL Cholesterol: 26 mg/dL (ref 0–99)
TRIGLYCERIDES: 39 mg/dL (ref <150)
Total CHOL/HDL Ratio: 1.9 RATIO
VLDL: 8 mg/dL (ref 0–40)

## 2018-01-29 LAB — CBC WITH DIFFERENTIAL/PLATELET
Abs Immature Granulocytes: 0.05 10*3/uL (ref 0.00–0.07)
BASOS ABS: 0 10*3/uL (ref 0.0–0.1)
Basophils Relative: 0 %
Eosinophils Absolute: 0 10*3/uL (ref 0.0–0.5)
Eosinophils Relative: 0 %
HEMATOCRIT: 45 % (ref 39.0–52.0)
Hemoglobin: 14.5 g/dL (ref 13.0–17.0)
Immature Granulocytes: 1 %
LYMPHS ABS: 0.8 10*3/uL (ref 0.7–4.0)
Lymphocytes Relative: 7 %
MCH: 31.3 pg (ref 26.0–34.0)
MCHC: 32.2 g/dL (ref 30.0–36.0)
MCV: 97 fL (ref 80.0–100.0)
Monocytes Absolute: 1 10*3/uL (ref 0.1–1.0)
Monocytes Relative: 9 %
Neutro Abs: 9.2 10*3/uL — ABNORMAL HIGH (ref 1.7–7.7)
Neutrophils Relative %: 83 %
Platelets: 126 10*3/uL — ABNORMAL LOW (ref 150–400)
RBC: 4.64 MIL/uL (ref 4.22–5.81)
RDW: 13.6 % (ref 11.5–15.5)
WBC: 11 10*3/uL — ABNORMAL HIGH (ref 4.0–10.5)
nRBC: 0 % (ref 0.0–0.2)

## 2018-01-29 LAB — LIPASE, BLOOD: Lipase: 21 U/L (ref 11–51)

## 2018-01-29 LAB — COMPREHENSIVE METABOLIC PANEL WITH GFR
ALT: 21 U/L (ref 0–44)
AST: 20 U/L (ref 15–41)
Albumin: 3.6 g/dL (ref 3.5–5.0)
Alkaline Phosphatase: 51 U/L (ref 38–126)
Anion gap: 9 (ref 5–15)
BUN: 14 mg/dL (ref 8–23)
CO2: 22 mmol/L (ref 22–32)
Calcium: 8.5 mg/dL — ABNORMAL LOW (ref 8.9–10.3)
Chloride: 107 mmol/L (ref 98–111)
Creatinine, Ser: 0.79 mg/dL (ref 0.61–1.24)
GFR calc Af Amer: >60 mL/min
GFR calc non Af Amer: >60 mL/min
Glucose, Bld: 107 mg/dL — ABNORMAL HIGH (ref 70–99)
Potassium: 3.6 mmol/L (ref 3.5–5.1)
Sodium: 138 mmol/L (ref 135–145)
Total Bilirubin: 1.2 mg/dL (ref 0.3–1.2)
Total Protein: 6.4 g/dL — ABNORMAL LOW (ref 6.5–8.1)

## 2018-01-29 MED ORDER — LISINOPRIL 5 MG PO TABS
2.5000 mg | ORAL_TABLET | Freq: Every morning | ORAL | Status: DC
  Administered 2018-01-29 – 2018-01-31 (×3): 2.5 mg via ORAL
  Filled 2018-01-29 (×3): qty 1

## 2018-01-29 MED ORDER — NITROGLYCERIN 0.4 MG SL SUBL: 0.4000 mg | SUBLINGUAL_TABLET | SUBLINGUAL | Status: DC | PRN

## 2018-01-29 MED ORDER — CIPROFLOXACIN IN D5W 400 MG/200ML IV SOLN
INTRAVENOUS | Status: AC
  Filled 2018-01-29: qty 200

## 2018-01-29 MED ORDER — ASPIRIN EC 81 MG PO TBEC
81.0000 mg | DELAYED_RELEASE_TABLET | Freq: Every morning | ORAL | Status: DC
  Administered 2018-01-29 – 2018-01-31 (×3): 81 mg via ORAL
  Filled 2018-01-29 (×3): qty 1

## 2018-01-29 MED ORDER — FAMOTIDINE IN NACL 20-0.9 MG/50ML-% IV SOLN
20.0000 mg | Freq: Two times a day (BID) | INTRAVENOUS | Status: DC
  Administered 2018-01-29 – 2018-01-31 (×6): 20 mg via INTRAVENOUS
  Filled 2018-01-29 (×6): qty 50

## 2018-01-29 MED ORDER — IPRATROPIUM BROMIDE 0.03 % NA SOLN
2.0000 | Freq: Two times a day (BID) | NASAL | Status: DC
  Administered 2018-01-30 – 2018-01-31 (×2): 2 via NASAL
  Filled 2018-01-29: qty 30

## 2018-01-29 NOTE — Progress Notes (Signed)
Patient has home CPAP unit at bedside. Water chamber filled with sterile water and machine is within reach of patient. Patient able to place self on and off when ready.

## 2018-01-29 NOTE — Progress Notes (Signed)
Attempted to place patient on CPAP. Patient is unsure of his home settings and prefers a nasal mask (which we are out of). Patient's son is bringing his home unit in for tomorrow night. Patient declined hospital unit tonight and will stick with the nasal cannula at 4 lpm. RN made aware.
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
## 2018-01-29 NOTE — Progress Notes (Signed)
PROGRESS NOTE  CREEDON DANIELSKI OVF:[id number] DOB: [birth year] DOA: 01/28/2018 PCP: Sharilyn Sites, MD  Brief History:  73 year old male with a history of ischemic cardiomyopathy with EF 25-30%, hyperlipidemia, coronary disease, hypertension, tobacco abuse in remission presenting with acute onset of epigastric abdominal pain that began on the afternoon of 01/28/2018.  The patient has some nausea without emesis.  Shortly after his abdominal pain, the patient had a short episode of chest discomfort.  He denied any dizziness, shortness of breath, palpitations.  There is been no fevers, chills, diarrhea, dysuria, hematuria.  The patient presented emergency department where CT angiogram of the abdomen and pelvis was performed.  It showed a dilated pancreatic duct up to 11 mm with calcifications in the distal aspect of the pancreas.  There was surrounding peripancreatic inflammation suggesting acute on chronic pancreatitis.  The patient states that he initially had a bout of pancreatitis approximately 20 years ago without any complications or recurrence since then.  He denies any alcohol use in nearly 20 years.  He has not smoked in over 10 years.  He denies any new medications.  He was noted to have lipase 26 with normal LFTs and normal bilirubin.  WBC was 14.4 with hemoglobin 14.5 and platelets 153,000.  GI was consulted to assist with management.  Assessment/Plan: Acute on chronic pancreatitis -CT angiogram abdomen and pelvis--patent celiac, SMA, IMA, renal arteries; pancreatic findings as discussed above -GI consult -N.p.o. 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 Gen change 08/06/2015  Hyperlipidemia -Continue  statin -Check lipid panel  Aortic stenosis with prior AVR  - pericardial tissue valve [employer] 23 mm placed 09/2008   COPD -Stable on room air  Impaired glucose tolerance -08/10/2016 hemoglobin A1c 6.2     Disposition Plan:   Home in 2-3 days  Family Communication:   Son updated at bedside  Consultants:  GI  Code Status:  FULL   DVT Prophylaxis:  SCDs   Procedures: As Listed in Progress Note Above  Antibiotics: None       Subjective: Patient denies fevers, chills, headache, chest pain, dyspnea, nausea, vomiting, diarrhea, abdominal pain, dysuria, hematuria, hematochezia, and melena.   Objective: Vitals:   01/28/18 2230 01/28/18 2300 01/28/18 2357 01/29/18 0015  BP: (!) 159/74 (!) 143/83 90/70   Pulse: (!) 133 89 100   Resp: (!) 23 (!) 28 16   Temp:   99 F (37.2 C)   TempSrc:   Oral   SpO2: (!) 85% 97% 94% 95%  Weight:   88.4 kg   Height:   5\' 11"  (1.803 m)     Intake/Output Summary (Last 24 hours) at 01/29/2018 8416 Last data filed at 01/29/2018 0500 Gross per 24 hour  Intake 240.92 ml  Output 450 ml  Net -209.08 ml   Weight change:  Exam:   General:  Pt is alert, follows commands appropriately, not in acute distress  HEENT: No icterus, No thrush, No neck mass, [place]/AT  Cardiovascular: RRR, S1/S2, no rubs, no gallops  Respiratory: Bibasilar crackles but no wheezing.  Good air movement  Abdomen: Soft/+BS, non tender, non distended, no guarding  Extremities: No edema, No lymphangitis, No petechiae, No rashes, no synovitis   Data Reviewed: I  have personally reviewed following labs and imaging studies Basic Metabolic Panel: Recent Labs  Lab 01/28/18 1826 01/29/18 0513  NA 139 138  K 3.6 3.6  CL 106 107  CO2 27 22  GLUCOSE 147* 107*  BUN 19 14  CREATININE 0.76 0.79  CALCIUM 8.9 8.5*  MG 2.0  --   PHOS 2.2*  --    Liver Function Tests: Recent Labs  Lab 01/28/18 1826 01/29/18 0513  AST 25 20  ALT 25 21  ALKPHOS 57  51  BILITOT 0.7 1.2  PROT 7.1 6.4*  ALBUMIN 4.2 3.6   Recent Labs  Lab 01/28/18 1826 01/29/18 0513  LIPASE 26 21   No results for input(s): AMMONIA in the last 168 hours. Coagulation Profile: No results for input(s): INR, PROTIME in the last 168 hours. CBC: Recent Labs  Lab 01/28/18 1826 01/29/18 0513  WBC 14.4* 11.0*  NEUTROABS  --  9.2*  HGB 15.0 14.5  HCT 48.2 45.0  MCV 97.4 97.0  PLT 153 126*   Cardiac Enzymes: Recent Labs  Lab 01/28/18 1826  TROPONINI <0.03   BNP: Invalid input(s): POCBNP CBG: No results for input(s): GLUCAP in the last 168 hours. HbA1C: No results for input(s): HGBA1C in the last 72 hours. Urine analysis:    Component Value Date/Time   COLORURINE YELLOW 01/28/2018 2030   APPEARANCEUR CLEAR 01/28/2018 2030   LABSPEC 1.021 01/28/2018 2030   PHURINE 8.0 01/28/2018 2030   GLUCOSEU 50 (A) 01/28/2018 2030   [place] NEGATIVE 01/28/2018 2030   [place] NEGATIVE 01/28/2018 2030   [place] 01/28/2018 2030   PROTEINUR 30 (A) 01/28/2018 2030   UROBILINOGEN 0.2 09/30/2008 0939   NITRITE NEGATIVE 01/28/2018 2030   LEUKOCYTESUR NEGATIVE 01/28/2018 2030   Sepsis Labs: @LABRCNTIP (procalcitonin:4,lacticidven:4) )No results found for this or any previous visit (from the past 240 hour(s)).   Scheduled Meds: . aspirin EC  81 mg Oral q morning - 10a  . ipratropium  2 spray Each Nare Q12H  . lisinopril  2.5 mg Oral q morning - 10a   Continuous Infusions: . 0.45 % NaCl with KCl 20 mEq / L Stopped (01/29/18 0458)  . famotidine (PEPCID) IV Stopped (01/29/18 0107)    Procedures/Studies: Dg Chest 2 View  Result Date: 01/28/2018 CLINICAL DATA:  Chest pain. EXAM: CHEST - 2 VIEW COMPARISON:  Radiograph of Jun 04, 2015. FINDINGS: Stable cardiomegaly. Status post coronary bypass graft. Stable position of single lead left-sided pacemaker. Status post aortic valve replacement. No pneumothorax or pleural effusion is noted. No acute pulmonary disease  is noted. Bony thorax is unremarkable. IMPRESSION: No active cardiopulmonary disease. Electronically Signed   By: Marijo Conception, M.D.   On: 01/28/2018 18:25   Ct Angio Chest/abd/pel For Dissection W And/or Wo Contrast  Result Date: 01/28/2018 CLINICAL DATA:  Initial evaluation for acute chest and epigastric pain. EXAM: CT ANGIOGRAPHY CHEST, ABDOMEN AND PELVIS TECHNIQUE: Multidetector CT imaging through the chest, abdomen and pelvis was performed using the standard protocol during bolus administration of intravenous contrast. Multiplanar reconstructed images and MIPs were obtained and reviewed to evaluate the vascular anatomy. CONTRAST:  171mL ISOVUE-370 IOPAMIDOL (ISOVUE-370) INJECTION 76% COMPARISON:  Prior radiograph from earlier the same day. FINDINGS: CTA CHEST FINDINGS Cardiovascular: Precontrast imaging through the intrathoracic aorta demonstrates no mural thrombus or other acute finding. Moderate scattered atheromatous plaque noted. Postcontrast imaging demonstrates no evidence for dissection or other acute aortic pathology. Sequelae of prior CABG noted. Visualized great vessels intact and normal. Left-sided pacemaker/AICD noted.  Cardiomegaly. Diffuse 3 vessel coronary artery calcifications. No pericardial effusion. Limited assessment of the pulmonary arterial tree demonstrates no acute finding. Mediastinum/Nodes: Thyroid normal. No enlarged mediastinal, hilar, or axillary lymph nodes. No mediastinal hematoma or mass. Esophagus within normal limits. Lungs/Pleura: Tracheobronchial tree intact and patent. Lungs well inflated bilaterally. Scattered fine subpleural fine reticular densities could reflect mild fibrotic lung changes and/or atelectasis. No consolidative opacity to suggest pneumonia. No pleural effusion or pulmonary edema. No pneumothorax. No worrisome pulmonary nodule or mass. Musculoskeletal: External soft tissues demonstrate no acute finding. No acute osseous abnormality. No discrete lytic or  blastic osseous lesions. Median sternotomy wires intact. Review of the MIP images confirms the above findings. CTA ABDOMEN AND PELVIS FINDINGS VASCULAR Aorta: Normal intravascular enhancement seen throughout the intra-abdominal aorta without evidence for dissection or aneurysm. Moderate atherosclerotic change noted. Celiac: Celiac axis and its branch vessels widely patent without stenosis. SMA: SMA widely patent to its distal aspects without stenosis. Renals: Single renal arteries present bilaterally, both of which are widely patent and well perfused. IMA: IMA widely patent to its distal aspect. Inflow: Moderate to advanced atherosclerotic change seen throughout the iliac arteries bilaterally. No high-grade stenosis. There is an irregular somewhat globular focal outpouching arising from the distal left common iliac artery (series 5, images 161-165). This focal outpouching appears to at least be partially extraluminal in nature, extending beyond adjacent calcified atherosclerotic plaque, suggestive of a small pseudoaneurysm. This measures up to 1.7 cm in diameter. No appreciable raised dissection flap identified. Left external and internal iliac arteries are well perfused distally. Veins: No acute venous abnormality identified within the abdomen and pelvis. Review of the MIP images confirms the above findings. NON-VASCULAR Hepatobiliary: Liver demonstrates a normal contrast enhanced appearance. Gallbladder within normal limits. No biliary dilatation. Pancreas: Pancreas is diffusely atrophic in appearance with multifocal calcifications seen involving the pancreatic head, uncinate process, as well as the body and tail, suggesting chronic pancreatitis. Pancreatic duct is dilated up to 11 mm in diameter. A prominent 11 mm calcification at the distal aspect of the pancreatic duct could conceivably be obstructive in nature (series 5, image 123). No other definite discernible pancreatic mass. Surrounding peripancreatic  inflammation suggest acute on chronic pancreatitis. No loculated peripancreatic collections or other complication identified. Spleen: Spleen within normal limits. Adrenals/Urinary Tract: 2 cm enhancing left adrenal nodule noted (series 5, image 103), indeterminate. Adrenal glands otherwise unremarkable. Kidneys equal in size with symmetric enhancement. 3.8 cm simple cyst present within the interpolar right kidney. No nephrolithiasis, hydronephrosis, or focal enhancing renal mass. No hydroureter. Partially distended bladder within normal limits. Stomach/Bowel: Stomach moderately distended without acute abnormality. Mild hazy stranding surrounds the duodenum related to the inflammatory process within the adjacent pancreas. Superimposed small duodenal diverticulum noted. Small bowel of normal caliber without evidence for obstruction. No acute inflammatory changes seen about the bowels. Appendix within normal limits. Lymphatic: No adenopathy. Reproductive: Prostate enlarged measuring 5.7 cm in transverse diameter. Other: Small bilateral inguinal hernias noted, left larger than right. Small amount of associated free fluid noted within the right inguinal hernia (series 5, image 195). No free intraperitoneal air. Musculoskeletal: No acute osseous abnormality. No discrete lytic or blastic osseous lesions. 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 Review of the MIP images confirms the above findings. IMPRESSION: 1. No CTA evidence for dissection or other acute aortic pathology. 2. Findings compatible with acute on chronic pancreatitis. Pancreatic duct dilated up to 11 mm, with possible 11 mm obstructive stone at the pancreatic  head. No definite obstructive mass. Further assessment with dedicated abdominal MRI could be performed for further evaluation as warranted. 3. 17 mm irregular focal outpouching arising from the distal common left iliac artery, suggestive of a pseudoaneurysm. 4.  Cardiomegaly with diffuse 3 vessel coronary artery calcifications and moderate aortic atherosclerosis. No aneurysm. 5. Small bilateral inguinal hernias, with small amount of associated free fluid on the right. Electronically Signed   By: Jeannine Boga M.D.   On: 01/28/2018 22:06    Orson Eva, DO  [facility] Pager [phone]  If 7PM-7AM, please contact night-coverage [email or web address] Password [email or web address] 01/29/2018, 9:03 AM   LOS: 1 day

## 2018-01-30 LAB — CBC WITH DIFFERENTIAL/PLATELET
Abs Immature Granulocytes: 0.03 10*3/uL (ref 0.00–0.07)
Basophils Absolute: 0 10*3/uL (ref 0.0–0.1)
Basophils Relative: 1 %
Eosinophils Absolute: 0.1 10*3/uL (ref 0.0–0.5)
Eosinophils Relative: 2 %
HCT: 47.2 % (ref 39.0–52.0)
Hemoglobin: 15 g/dL (ref 13.0–17.0)
Immature Granulocytes: 0 %
LYMPHS ABS: 0.9 10*3/uL (ref 0.7–4.0)
Lymphocytes Relative: 11 %
MCH: 30.9 pg (ref 26.0–34.0)
MCHC: 31.8 g/dL (ref 30.0–36.0)
MCV: 97.3 fL (ref 80.0–100.0)
Monocytes Absolute: 0.7 10*3/uL (ref 0.1–1.0)
Monocytes Relative: 10 %
NRBC: 0 % (ref 0.0–0.2)
Neutro Abs: 5.8 10*3/uL (ref 1.7–7.7)
Neutrophils Relative %: 76 %
Platelets: 123 10*3/uL — ABNORMAL LOW (ref 150–400)
RBC: 4.85 MIL/uL (ref 4.22–5.81)
RDW: 13.3 % (ref 11.5–15.5)
WBC: 7.5 10*3/uL (ref 4.0–10.5)

## 2018-01-30 LAB — COMPREHENSIVE METABOLIC PANEL
ALK PHOS: 47 U/L (ref 38–126)
ALT: 20 U/L (ref 0–44)
AST: 23 U/L (ref 15–41)
Albumin: 3.6 g/dL (ref 3.5–5.0)
Anion gap: 8 (ref 5–15)
BUN: 20 mg/dL (ref 8–23)
CO2: 22 mmol/L (ref 22–32)
Calcium: 8.7 mg/dL — ABNORMAL LOW (ref 8.9–10.3)
Chloride: 107 mmol/L (ref 98–111)
Creatinine, Ser: 0.79 mg/dL (ref 0.61–1.24)
GFR calc Af Amer: >60 mL/min (ref >60)
GFR calc non Af Amer: >60 mL/min (ref >60)
Glucose, Bld: 73 mg/dL (ref 70–99)
Potassium: 3.9 mmol/L (ref 3.5–5.1)
Sodium: 137 mmol/L (ref 135–145)
Total Bilirubin: 1.5 mg/dL — ABNORMAL HIGH (ref 0.3–1.2)
Total Protein: 6.5 g/dL (ref 6.5–8.1)

## 2018-01-30 LAB — LIPASE, BLOOD: Lipase: 25 U/L (ref 11–51)

## 2018-01-30 MED ORDER — ATORVASTATIN CALCIUM 40 MG PO TABS
80.0000 mg | ORAL_TABLET | Freq: Every evening | ORAL | Status: DC
  Administered 2018-01-30: 80 mg via ORAL
  Filled 2018-01-30: qty 2

## 2018-01-30 NOTE — Progress Notes (Signed)
PROGRESS NOTE  [name] HAL:[id number] DOB: 06-19-1945 DOA: 01/28/2018 PCP: Sharilyn Sites, MD   LOS: 2 days   Brief Narrative / Interim history: 73 year old male with a history of ischemic cardiomyopathy with EF 25-30%, hyperlipidemia, coronary disease, hypertension, tobacco abuse in remission presenting with acute onset of epigastric abdominal pain that began on the afternoon of 01/28/2018.  The patient has some nausea without emesis.  Shortly after his abdominal pain, the patient had a short episode of chest discomfort.  He denied any dizziness, shortness of breath, palpitations.  There is been no fevers, chills, diarrhea, dysuria, hematuria.  The patient presented emergency department where CT angiogram of the abdomen and pelvis was performed.  It showed a dilated pancreatic duct up to 11 mm with calcifications in the distal aspect of the pancreas.  There was surrounding peripancreatic inflammation suggesting acute on chronic pancreatitis.  The patient states that he initially had a bout of pancreatitis approximately 20 years ago without any complications or recurrence since then.  He denies any alcohol use in nearly 20 years.  He has not smoked in over 10 years.  He denies any new medications.  He was noted to have lipase 26 with normal LFTs and normal bilirubin.  WBC was 14.4 with hemoglobin 14.5 and platelets 153,000.  GI was consulted to assist with management.  Subjective: -Continues to have abdominal pain however feels improved, denies any chest pain, denies any shortness of breath.  No nausea or vomiting.  Assessment & Plan: Principal Problem:   Acute on chronic pancreatitis ([place]) Active Problems:   COPD (chronic obstructive pulmonary disease) (HCC)   Hypertension   AICD (automatic cardioverter/defibrillator) present   Hyperlipidemia   Chronic combined systolic and diastolic heart failure (HCC)   Principal Problem Acute on chronic pancreatitis -CT angiogram abdomen and  pelvis--patent celiac, SMA, IMA, renal arteries; pancreatic findings as discussed above with concern for pancreatic duct stone -Gastroenterology consulted, appreciate input, discussed with Neil Crouch, PA this morning, they will further discuss case with gastroenterology at [place] clear liquid, hold further IV fluids given known CHF  Active problems Chronic systolic and diastolic CHF/ischemic cardiomyopathy/AICD -05/25/2015 Echo EF 25-30%, grade 1 DD -Clinically compensated currently without significant fluid overload -Continue lisinopril -followed by Dr Lovena Le. Gen change 08/06/2015  Hyperlipidemia -Continue statin -LDL 26  Aortic stenosis with prior AVR  -pericardial tissue valve [employer] 23 mm placed 09/2008   COPD -Stable on room air  Impaired glucose tolerance -08/10/2016 hemoglobin A1c 6.2   Scheduled Meds: . aspirin EC  81 mg Oral q morning - 10a  . ipratropium  2 spray Each Nare Q12H  . lisinopril  2.5 mg Oral q morning - 10a   Continuous Infusions: . famotidine (PEPCID) IV 20 mg (01/30/18 1007)   PRN Meds:.acetaminophen **OR** acetaminophen, HYDROmorphone (DILAUDID) injection, nitroGLYCERIN, ondansetron **OR** ondansetron (ZOFRAN) IV  DVT prophylaxis: SCDs Code Status: Full code Family Communication: no family at bedside  Disposition Plan: home when cleared by GI  Consultants:   Gastroenterology   Procedures:   None   Antimicrobials:  None    Objective: Vitals:   01/29/18 0015 01/29/18 2054 01/29/18 2123 01/30/18 0516  BP:   111/67 (!) 123/54  Pulse:   60 (!) 58  Resp:      Temp:   99.5 F (37.5 C) 99 F (37.2 C)  TempSrc:   Oral Oral  SpO2: 95% 93% 92% 93%  Weight:      Height:  Intake/Output Summary (Last 24 hours) at 01/30/2018 1327 Last data filed at 01/30/2018 0924 Gross per 24 hour  Intake 790.73 ml  Output 1150 ml  Net -359.27 ml   Filed Weights   01/28/18 1801 01/28/18 2357  Weight:  86.2 kg 88.4 kg    Examination:  Constitutional: NAD Eyes: PERRL, lids and conjunctivae normal ENMT: Mucous membranes are moist. Neck: normal, supple Respiratory: clear to auscultation bilaterally, no wheezing, no crackles. Normal respiratory effort.  Cardiovascular: Regular rate and rhythm, no murmurs / rubs / gallops.  Trace LE edema.  Abdomen: Tenderness to palpation in the epigastric area without guarding or rebound Musculoskeletal: no clubbing / cyanosis.  Skin: no rashes Neurologic: CN 2-12 grossly intact. Strength 5/5 in all 4.  Psychiatric: Normal judgment and insight. Alert and oriented x 3. Normal mood.    Data Reviewed: I have independently reviewed following labs and imaging studies   CBC: Recent Labs  Lab 01/28/18 1826 01/29/18 0513 01/30/18 0524  WBC 14.4* 11.0* 7.5  NEUTROABS  --  9.2* 5.8  HGB 15.0 14.5 15.0  HCT 48.2 45.0 47.2  MCV 97.4 97.0 97.3  PLT 153 126* 384*   Basic Metabolic Panel: Recent Labs  Lab 01/28/18 1826 01/29/18 0513 01/30/18 0524  NA 139 138 137  K 3.6 3.6 3.9  CL 106 107 107  CO2 27 22 22   GLUCOSE 147* 107* 73  BUN 19 14 20   CREATININE 0.76 0.79 0.79  CALCIUM 8.9 8.5* 8.7*  MG 2.0  --   --   PHOS 2.2*  --   --    GFR: Estimated Creatinine Clearance: 86.3 mL/min (by C-G formula based on SCr of 0.79 mg/dL). Liver Function Tests: Recent Labs  Lab 01/28/18 1826 01/29/18 0513 01/30/18 0524  AST 25 20 23   ALT 25 21 20   ALKPHOS 57 51 47  BILITOT 0.7 1.2 1.5*  PROT 7.1 6.4* 6.5  ALBUMIN 4.2 3.6 3.6   Recent Labs  Lab 01/28/18 1826 01/29/18 0513 01/30/18 0524  LIPASE 26 21 25    No results for input(s): AMMONIA in the last 168 hours. Coagulation Profile: No results for input(s): INR, PROTIME in the last 168 hours. Cardiac Enzymes: Recent Labs  Lab 01/28/18 1826  TROPONINI <0.03   BNP (last 3 results) No results for input(s): PROBNP in the last 8760 hours. HbA1C: No results for input(s): HGBA1C in the last 72  hours. CBG: No results for input(s): GLUCAP in the last 168 hours. Lipid Profile: Recent Labs    01/29/18 0513  CHOL 73  HDL 39*  LDLCALC 26  TRIG 39  CHOLHDL 1.9   Thyroid Function Tests: No results for input(s): TSH, T4TOTAL, FREET4, T3FREE, THYROIDAB in the last 72 hours. Anemia Panel: No results for input(s): VITAMINB12, FOLATE, FERRITIN, TIBC, IRON, RETICCTPCT in the last 72 hours. Urine analysis:    Component Value Date/Time   COLORURINE YELLOW 01/28/2018 2030   APPEARANCEUR CLEAR 01/28/2018 2030   LABSPEC 1.021 01/28/2018 2030   PHURINE 8.0 01/28/2018 2030   GLUCOSEU 50 (A) 01/28/2018 2030   HGBUR NEGATIVE 01/28/2018 2030   [place] NEGATIVE 01/28/2018 2030   [place] 01/28/2018 2030   PROTEINUR 30 (A) 01/28/2018 2030   UROBILINOGEN 0.2 09/30/2008 0939   NITRITE NEGATIVE 01/28/2018 2030   LEUKOCYTESUR NEGATIVE 01/28/2018 2030   Sepsis Labs: Invalid input(s): PROCALCITONIN, LACTICIDVEN  No results found for this or any previous visit (from the past 240 hour(s)).    Radiology Studies: Dg Chest 2 View  Result Date: 01/28/2018 CLINICAL DATA:  Chest pain. EXAM: CHEST - 2 VIEW COMPARISON:  Radiograph of Jun 04, 2015. FINDINGS: Stable cardiomegaly. Status post coronary bypass graft. Stable position of single lead left-sided pacemaker. Status post aortic valve replacement. No pneumothorax or pleural effusion is noted. No acute pulmonary disease is noted. Bony thorax is unremarkable. IMPRESSION: No active cardiopulmonary disease. Electronically Signed   By: Marijo Conception, M.D.   On: 01/28/2018 18:25   Ct Angio Chest/abd/pel For Dissection W And/or Wo Contrast  Result Date: 01/28/2018 CLINICAL DATA:  Initial evaluation for acute chest and epigastric pain. EXAM: CT ANGIOGRAPHY CHEST, ABDOMEN AND PELVIS TECHNIQUE: Multidetector CT imaging through the chest, abdomen and pelvis was performed using the standard protocol during bolus administration of intravenous  contrast. Multiplanar reconstructed images and MIPs were obtained and reviewed to evaluate the vascular anatomy. CONTRAST:  144mL ISOVUE-370 IOPAMIDOL (ISOVUE-370) INJECTION 76% COMPARISON:  Prior radiograph from earlier the same day. FINDINGS: CTA CHEST FINDINGS Cardiovascular: Precontrast imaging through the intrathoracic aorta demonstrates no mural thrombus or other acute finding. Moderate scattered atheromatous plaque noted. Postcontrast imaging demonstrates no evidence for dissection or other acute aortic pathology. Sequelae of prior CABG noted. Visualized great vessels intact and normal. Left-sided pacemaker/AICD noted. Cardiomegaly. Diffuse 3 vessel coronary artery calcifications. No pericardial effusion. Limited assessment of the pulmonary arterial tree demonstrates no acute finding. Mediastinum/Nodes: Thyroid normal. No enlarged mediastinal, hilar, or axillary lymph nodes. No mediastinal hematoma or mass. Esophagus within normal limits. Lungs/Pleura: Tracheobronchial tree intact and patent. Lungs well inflated bilaterally. Scattered fine subpleural fine reticular densities could reflect mild fibrotic lung changes and/or atelectasis. No consolidative opacity to suggest pneumonia. No pleural effusion or pulmonary edema. No pneumothorax. No worrisome pulmonary nodule or mass. Musculoskeletal: External soft tissues demonstrate no acute finding. No acute osseous abnormality. No discrete lytic or blastic osseous lesions. Median sternotomy wires intact. Review of the MIP images confirms the above findings. CTA ABDOMEN AND PELVIS FINDINGS VASCULAR Aorta: Normal intravascular enhancement seen throughout the intra-abdominal aorta without evidence for dissection or aneurysm. Moderate atherosclerotic change noted. Celiac: Celiac axis and its branch vessels widely patent without stenosis. SMA: SMA widely patent to its distal aspects without stenosis. Renals: Single renal arteries present bilaterally, both of which are  widely patent and well perfused. IMA: IMA widely patent to its distal aspect. Inflow: Moderate to advanced atherosclerotic change seen throughout the iliac arteries bilaterally. No high-grade stenosis. There is an irregular somewhat globular focal outpouching arising from the distal left common iliac artery (series 5, images 161-165). This focal outpouching appears to at least be partially extraluminal in nature, extending beyond adjacent calcified atherosclerotic plaque, suggestive of a small pseudoaneurysm. This measures up to 1.7 cm in diameter. No appreciable raised dissection flap identified. Left external and internal iliac arteries are well perfused distally. Veins: No acute venous abnormality identified within the abdomen and pelvis. Review of the MIP images confirms the above findings. NON-VASCULAR Hepatobiliary: Liver demonstrates a normal contrast enhanced appearance. Gallbladder within normal limits. No biliary dilatation. Pancreas: Pancreas is diffusely atrophic in appearance with multifocal calcifications seen involving the pancreatic head, uncinate process, as well as the body and tail, suggesting chronic pancreatitis. Pancreatic duct is dilated up to 11 mm in diameter. A prominent 11 mm calcification at the distal aspect of the pancreatic duct could conceivably be obstructive in nature (series 5, image 123). No other definite discernible pancreatic mass. Surrounding peripancreatic inflammation suggest acute on chronic pancreatitis. No loculated peripancreatic collections or other complication  identified. Spleen: Spleen within normal limits. Adrenals/Urinary Tract: 2 cm enhancing left adrenal nodule noted (series 5, image 103), indeterminate. Adrenal glands otherwise unremarkable. Kidneys equal in size with symmetric enhancement. 3.8 cm simple cyst present within the interpolar right kidney. No nephrolithiasis, hydronephrosis, or focal enhancing renal mass. No hydroureter. Partially distended bladder  within normal limits. Stomach/Bowel: Stomach moderately distended without acute abnormality. Mild hazy stranding surrounds the duodenum related to the inflammatory process within the adjacent pancreas. Superimposed small duodenal diverticulum noted. Small bowel of normal caliber without evidence for obstruction. No acute inflammatory changes seen about the bowels. Appendix within normal limits. Lymphatic: No adenopathy. Reproductive: Prostate enlarged measuring 5.7 cm in transverse diameter. Other: Small bilateral inguinal hernias noted, left larger than right. Small amount of associated free fluid noted within the right inguinal hernia (series 5, image 195). No free intraperitoneal air. Musculoskeletal: No acute osseous abnormality. No discrete lytic or blastic osseous lesions. 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 Review of the MIP images confirms the above findings. IMPRESSION: 1. No CTA evidence for dissection or other acute aortic pathology. 2. Findings compatible with acute on chronic pancreatitis. Pancreatic duct dilated up to 11 mm, with possible 11 mm obstructive stone at the pancreatic head. No definite obstructive mass. Further assessment with dedicated abdominal MRI could be performed for further evaluation as warranted. 3. 17 mm irregular focal outpouching arising from the distal common left iliac artery, suggestive of a pseudoaneurysm. 4. Cardiomegaly with diffuse 3 vessel coronary artery calcifications and moderate aortic atherosclerosis. No aneurysm. 5. Small bilateral inguinal hernias, with small amount of associated free fluid on the right. Electronically Signed   By: Jeannine Boga M.D.   On: 01/28/2018 22:06    Marzetta Board, MD, PhD [facility]  Contact via  [email or web address]  [place] P: [phone]  F: [phone]

## 2018-01-30 NOTE — Progress Notes (Signed)
Subjective:  Complains of 8/10 abdominal pain. Hurts into the back. Feels hungry. +flatus. Last dose of pain medication almost 24 hours ago.   Objective: Vital signs in last 24 hours: Temp:  [99 F (37.2 C)-99.5 F (37.5 C)] 99 F (37.2 C) (01/08 0516) Pulse Rate:  [58-60] 58 (01/08 0516) BP: (111-123)/(54-67) 123/54 (01/08 0516) SpO2:  [92 %-93 %] 93 % (01/08 0516) Last BM Date: 01/28/18 General:   Alert,  Well-developed, well-nourished, pleasant and cooperative in NAD. Appears comfortable.  Son at bedside.  Head:  Normocephalic and atraumatic. Eyes:  Sclera clear, no icterus. . Abdomen:  Soft, moderate epigastric tenderness and nondistended. Normal bowel sounds, without guarding, and without rebound.  . Neurologic:  Alert and  oriented x4;  Psych:  Alert and cooperative. Normal mood and affect.  Intake/Output from previous day: 01/07 0701 - 01/08 0700 In: 790.7 [I.V.:737.5; IV Piggyback:53.3] Out: 750 [Urine:750] Intake/Output this shift: No intake/output data recorded.  Lab Results: CBC Recent Labs    01/28/18 1826 01/29/18 0513 01/30/18 0524  WBC 14.4* 11.0* 7.5  HGB 15.0 14.5 15.0  HCT 48.2 45.0 47.2  MCV 97.4 97.0 97.3  PLT 153 126* 123*   BMET Recent Labs    01/28/18 1826 01/29/18 0513 01/30/18 0524  NA 139 138 137  K 3.6 3.6 3.9  CL 106 107 107  CO2 27 22 22   GLUCOSE 147* 107* 73  BUN 19 14 20   CREATININE 0.76 0.79 0.79  CALCIUM 8.9 8.5* 8.7*   LFTs Recent Labs    01/28/18 1826 01/29/18 0513 01/30/18 0524  BILITOT 0.7 1.2 1.5*  BILIDIR 0.1  --   --   IBILI 0.6  --   --   ALKPHOS 57 51 47  AST 25 20 23   ALT 25 21 20   PROT 7.1 6.4* 6.5  ALBUMIN 4.2 3.6 3.6   Recent Labs    01/28/18 1826 01/29/18 0513 01/30/18 0524  LIPASE 26 21 25    PT/INR No results for input(s): LABPROT, INR in the last 72 hours.    Imaging Studies: Dg Chest 2 View  Result Date: 01/28/2018 CLINICAL DATA:  Chest pain. EXAM: CHEST - 2 VIEW COMPARISON:  Radiograph  of Jun 04, 2015. FINDINGS: Stable cardiomegaly. Status post coronary bypass graft. Stable position of single lead left-sided pacemaker. Status post aortic valve replacement. No pneumothorax or pleural effusion is noted. No acute pulmonary disease is noted. Bony thorax is unremarkable. IMPRESSION: No active cardiopulmonary disease. Electronically Signed   By: Marijo Conception, M.D.   On: 01/28/2018 18:25   Ct Angio Chest/abd/pel For Dissection W And/or Wo Contrast  Result Date: 01/28/2018 CLINICAL DATA:  Initial evaluation for acute chest and epigastric pain. EXAM: CT ANGIOGRAPHY CHEST, ABDOMEN AND PELVIS TECHNIQUE: Multidetector CT imaging through the chest, abdomen and pelvis was performed using the standard protocol during bolus administration of intravenous contrast. Multiplanar reconstructed images and MIPs were obtained and reviewed to evaluate the vascular anatomy. CONTRAST:  154mL ISOVUE-370 IOPAMIDOL (ISOVUE-370) INJECTION 76% COMPARISON:  Prior radiograph from earlier the same day. FINDINGS: CTA CHEST FINDINGS Cardiovascular: Precontrast imaging through the intrathoracic aorta demonstrates no mural thrombus or other acute finding. Moderate scattered atheromatous plaque noted. Postcontrast imaging demonstrates no evidence for dissection or other acute aortic pathology. Sequelae of prior CABG noted. Visualized great vessels intact and normal. Left-sided pacemaker/AICD noted. Cardiomegaly. Diffuse 3 vessel coronary artery calcifications. No pericardial effusion. Limited assessment of the pulmonary arterial tree demonstrates no acute finding. Mediastinum/Nodes: Thyroid normal. No  enlarged mediastinal, hilar, or axillary lymph nodes. No mediastinal hematoma or mass. Esophagus within normal limits. Lungs/Pleura: Tracheobronchial tree intact and patent. Lungs well inflated bilaterally. Scattered fine subpleural fine reticular densities could reflect mild fibrotic lung changes and/or atelectasis. No consolidative  opacity to suggest pneumonia. No pleural effusion or pulmonary edema. No pneumothorax. No worrisome pulmonary nodule or mass. Musculoskeletal: External soft tissues demonstrate no acute finding. No acute osseous abnormality. No discrete lytic or blastic osseous lesions. Median sternotomy wires intact. Review of the MIP images confirms the above findings. CTA ABDOMEN AND PELVIS FINDINGS VASCULAR Aorta: Normal intravascular enhancement seen throughout the intra-abdominal aorta without evidence for dissection or aneurysm. Moderate atherosclerotic change noted. Celiac: Celiac axis and its branch vessels widely patent without stenosis. SMA: SMA widely patent to its distal aspects without stenosis. Renals: Single renal arteries present bilaterally, both of which are widely patent and well perfused. IMA: IMA widely patent to its distal aspect. Inflow: Moderate to advanced atherosclerotic change seen throughout the iliac arteries bilaterally. No high-grade stenosis. There is an irregular somewhat globular focal outpouching arising from the distal left common iliac artery (series 5, images 161-165). This focal outpouching appears to at least be partially extraluminal in nature, extending beyond adjacent calcified atherosclerotic plaque, suggestive of a small pseudoaneurysm. This measures up to 1.7 cm in diameter. No appreciable raised dissection flap identified. Left external and internal iliac arteries are well perfused distally. Veins: No acute venous abnormality identified within the abdomen and pelvis. Review of the MIP images confirms the above findings. NON-VASCULAR Hepatobiliary: Liver demonstrates a normal contrast enhanced appearance. Gallbladder within normal limits. No biliary dilatation. Pancreas: Pancreas is diffusely atrophic in appearance with multifocal calcifications seen involving the pancreatic head, uncinate process, as well as the body and tail, suggesting chronic pancreatitis. Pancreatic duct is dilated  up to 11 mm in diameter. A prominent 11 mm calcification at the distal aspect of the pancreatic duct could conceivably be obstructive in nature (series 5, image 123). No other definite discernible pancreatic mass. Surrounding peripancreatic inflammation suggest acute on chronic pancreatitis. No loculated peripancreatic collections or other complication identified. Spleen: Spleen within normal limits. Adrenals/Urinary Tract: 2 cm enhancing left adrenal nodule noted (series 5, image 103), indeterminate. Adrenal glands otherwise unremarkable. Kidneys equal in size with symmetric enhancement. 3.8 cm simple cyst present within the interpolar right kidney. No nephrolithiasis, hydronephrosis, or focal enhancing renal mass. No hydroureter. Partially distended bladder within normal limits. Stomach/Bowel: Stomach moderately distended without acute abnormality. Mild hazy stranding surrounds the duodenum related to the inflammatory process within the adjacent pancreas. Superimposed small duodenal diverticulum noted. Small bowel of normal caliber without evidence for obstruction. No acute inflammatory changes seen about the bowels. Appendix within normal limits. Lymphatic: No adenopathy. Reproductive: Prostate enlarged measuring 5.7 cm in transverse diameter. Other: Small bilateral inguinal hernias noted, left larger than right. Small amount of associated free fluid noted within the right inguinal hernia (series 5, image 195). No free intraperitoneal air. Musculoskeletal: No acute osseous abnormality. No discrete lytic or blastic osseous lesions. 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 Review of the MIP images confirms the above findings. IMPRESSION: 1. No CTA evidence for dissection or other acute aortic pathology. 2. Findings compatible with acute on chronic pancreatitis. Pancreatic duct dilated up to 11 mm, with possible 11 mm obstructive stone at the pancreatic head. No definite  obstructive mass. Further assessment with dedicated abdominal MRI could be performed for further evaluation as warranted. 3. 17 mm irregular focal  outpouching arising from the distal common left iliac artery, suggestive of a pseudoaneurysm. 4. Cardiomegaly with diffuse 3 vessel coronary artery calcifications and moderate aortic atherosclerosis. No aneurysm. 5. Small bilateral inguinal hernias, with small amount of associated free fluid on the right. Electronically Signed   By: Jeannine Boga M.D.   On: 01/28/2018 22:06  [2 weeks]   Assessment:  73 year old male presenting with acute on chronic pancreatitis, findings of chronic pancreatic ductal dilation (previously noted on ultrasound and CT in 2004), and possible obstructive stone of the pancreatic head.  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 Having some epigastric pain/back pain this morning. Last dose of pain medication nearly 24 hours ago as patient reports he wasn't aware that he could have more pain medication. Feels hungry. Total bilirubin minimally elevated, not differentiated.   Plan: 1. Outpatient evaluation at [facility] for possible EUS/ERCP. Will start referral process. 2. Add pancreatic enzymes once oral intake resumed, weight-based dosing. 3. Recheck LFTs in AM. 4. Consider trial of clear liquids later today.  5. Encouraged patient to utilize pain medication as needed.   Laureen Ochs. Bernarda Caffey [facility] [phone] 1/8/20209:37 AM     LOS: 2 days

## 2018-01-31 ENCOUNTER — Telehealth: Payer: Self-pay | Admitting: Gastroenterology

## 2018-01-31 DIAGNOSIS — K859 Acute pancreatitis without necrosis or infection, unspecified: Secondary | ICD-10-CM

## 2018-01-31 DIAGNOSIS — J410 Simple chronic bronchitis: Secondary | ICD-10-CM

## 2018-01-31 DIAGNOSIS — K8689 Other specified diseases of pancreas: Secondary | ICD-10-CM

## 2018-01-31 DIAGNOSIS — K861 Other chronic pancreatitis: Principal | ICD-10-CM

## 2018-01-31 LAB — CBC WITH DIFFERENTIAL/PLATELET
Abs Immature Granulocytes: 0.04 10*3/uL (ref 0.00–0.07)
Basophils Absolute: 0 10*3/uL (ref 0.0–0.1)
Basophils Relative: 0 %
Eosinophils Absolute: 0.3 10*3/uL (ref 0.0–0.5)
Eosinophils Relative: 4 %
HCT: 45.3 % (ref 39.0–52.0)
Hemoglobin: 14.8 g/dL (ref 13.0–17.0)
Immature Granulocytes: 1 %
Lymphocytes Relative: 11 %
Lymphs Abs: 0.9 10*3/uL (ref 0.7–4.0)
MCH: 31 pg (ref 26.0–34.0)
MCHC: 32.7 g/dL (ref 30.0–36.0)
MCV: 95 fL (ref 80.0–100.0)
MONO ABS: 1.1 10*3/uL — AB (ref 0.1–1.0)
Monocytes Relative: 14 %
NEUTROS ABS: 5.8 10*3/uL (ref 1.7–7.7)
Neutrophils Relative %: 70 %
Platelets: 148 10*3/uL — ABNORMAL LOW (ref 150–400)
RBC: 4.77 MIL/uL (ref 4.22–5.81)
RDW: 13.2 % (ref 11.5–15.5)
WBC: 8.2 10*3/uL (ref 4.0–10.5)
nRBC: 0 % (ref 0.0–0.2)

## 2018-01-31 LAB — COMPREHENSIVE METABOLIC PANEL
ALT: 20 U/L (ref 0–44)
AST: 22 U/L (ref 15–41)
Albumin: 3.5 g/dL (ref 3.5–5.0)
Alkaline Phosphatase: 47 U/L (ref 38–126)
Anion gap: 8 (ref 5–15)
BUN: 16 mg/dL (ref 8–23)
CO2: 23 mmol/L (ref 22–32)
Calcium: 8.8 mg/dL — ABNORMAL LOW (ref 8.9–10.3)
Chloride: 107 mmol/L (ref 98–111)
Creatinine, Ser: 0.79 mg/dL (ref 0.61–1.24)
GFR calc Af Amer: >60 mL/min (ref >60)
GFR calc non Af Amer: >60 mL/min (ref >60)
Glucose, Bld: 93 mg/dL (ref 70–99)
Potassium: 4 mmol/L (ref 3.5–5.1)
Sodium: 138 mmol/L (ref 135–145)
Total Bilirubin: 1.2 mg/dL (ref 0.3–1.2)
Total Protein: 6.6 g/dL (ref 6.5–8.1)

## 2018-01-31 LAB — LIPASE, BLOOD: Lipase: 20 U/L (ref 11–51)

## 2018-01-31 MED ORDER — PANCRELIPASE (LIP-PROT-AMYL) 36000-114000 UNITS PO CPEP: 72000.0000 [IU] | ORAL_CAPSULE | Freq: Three times a day (TID) | ORAL | 1 refills | Status: DC

## 2018-01-31 MED ORDER — PANCRELIPASE (LIP-PROT-AMYL) 36000-114000 UNITS PO CPEP: 36000.0000 [IU] | ORAL_CAPSULE | ORAL | 0 refills | Status: DC

## 2018-01-31 MED ORDER — PANCRELIPASE (LIP-PROT-AMYL) 12000-38000 UNITS PO CPEP
72000.0000 [IU] | ORAL_CAPSULE | Freq: Three times a day (TID) | ORAL | Status: DC
  Administered 2018-01-31 (×2): 72000 [IU] via ORAL
  Filled 2018-01-31 (×2): qty 6

## 2018-01-31 MED ORDER — PANCRELIPASE (LIP-PROT-AMYL) 12000-38000 UNITS PO CPEP
36000.0000 [IU] | ORAL_CAPSULE | ORAL | Status: DC
  Administered 2018-01-31: 36000 [IU] via ORAL
  Filled 2018-01-31: qty 3

## 2018-01-31 NOTE — Progress Notes (Signed)
Subjective:  Feeling better. No pain in nearly 24 hours. Hungry. Ate all of heart healthy breakfast. +flatus.   Objective: Vital signs in last 24 hours: Temp:  [97.4 F (36.3 C)-98.7 F (37.1 C)] 98.1 F (36.7 C) (01/09 0450) Pulse Rate:  [56-59] 58 (01/09 0450) Resp:  [16-19] 17 (01/09 0450) BP: (118-127)/(64-74) 126/64 (01/09 0450) SpO2:  [92 %-95 %] 95 % (01/09 0450) Last BM Date: 01/28/18 General:   Alert,  Well-developed, well-nourished, pleasant and cooperative in NAD Head:  Normocephalic and atraumatic. Eyes:  Sclera clear, no icterus.  Abdomen:  Soft,  nondistended. Mild epig tenderness. Normal bowel sounds, without guarding, and without rebound.   Extremities:  Without clubbing, deformity or edema. Neurologic:  Alert and  oriented x4;  grossly normal neurologically. Skin:  Intact without significant lesions or rashes. Psych:  Alert and cooperative. Normal mood and affect.  Intake/Output from previous day: 01/08 0701 - 01/09 0700 In: 1180 [P.O.:1080; IV Piggyback:100] Out: 1700 [Urine:1700] Intake/Output this shift: No intake/output data recorded.  Lab Results: CBC Recent Labs    01/29/18 0513 01/30/18 0524 01/31/18 0618  WBC 11.0* 7.5 8.2  HGB 14.5 15.0 14.8  HCT 45.0 47.2 45.3  MCV 97.0 97.3 95.0  PLT 126* 123* 148*   BMET Recent Labs    01/29/18 0513 01/30/18 0524 01/31/18 0618  NA 138 137 138  K 3.6 3.9 4.0  CL 107 107 107  CO2 22 22 23   GLUCOSE 107* 73 93  BUN 14 20 16   CREATININE 0.79 0.79 0.79  CALCIUM 8.5* 8.7* 8.8*   LFTs Recent Labs    01/28/18 1826 01/29/18 0513 01/30/18 0524 01/31/18 0618  BILITOT 0.7 1.2 1.5* 1.2  BILIDIR 0.1  --   --   --   IBILI 0.6  --   --   --   ALKPHOS 57 51 47 47  AST 25 20 23 22   ALT 25 21 20 20   PROT 7.1 6.4* 6.5 6.6  ALBUMIN 4.2 3.6 3.6 3.5   Recent Labs    01/29/18 0513 01/30/18 0524 01/31/18 0618  LIPASE 21 25 20    PT/INR No results for input(s): LABPROT, INR in the last 72 hours.     Imaging Studies: Dg Chest 2 View  Result Date: 01/28/2018 CLINICAL DATA:  Chest pain. EXAM: CHEST - 2 VIEW COMPARISON:  Radiograph of Jun 04, 2015. FINDINGS: Stable cardiomegaly. Status post coronary bypass graft. Stable position of single lead left-sided pacemaker. Status post aortic valve replacement. No pneumothorax or pleural effusion is noted. No acute pulmonary disease is noted. Bony thorax is unremarkable. IMPRESSION: No active cardiopulmonary disease. Electronically Signed   By: Marijo Conception, M.D.   On: 01/28/2018 18:25   Ct Angio Chest/abd/pel For Dissection W And/or Wo Contrast  Result Date: 01/28/2018 CLINICAL DATA:  Initial evaluation for acute chest and epigastric pain. EXAM: CT ANGIOGRAPHY CHEST, ABDOMEN AND PELVIS TECHNIQUE: Multidetector CT imaging through the chest, abdomen and pelvis was performed using the standard protocol during bolus administration of intravenous contrast. Multiplanar reconstructed images and MIPs were obtained and reviewed to evaluate the vascular anatomy. CONTRAST:  160mL ISOVUE-370 IOPAMIDOL (ISOVUE-370) INJECTION 76% COMPARISON:  Prior radiograph from earlier the same day. FINDINGS: CTA CHEST FINDINGS Cardiovascular: Precontrast imaging through the intrathoracic aorta demonstrates no mural thrombus or other acute finding. Moderate scattered atheromatous plaque noted. Postcontrast imaging demonstrates no evidence for dissection or other acute aortic pathology. Sequelae of prior CABG noted. Visualized great vessels intact and normal. Left-sided pacemaker/AICD  noted. Cardiomegaly. Diffuse 3 vessel coronary artery calcifications. No pericardial effusion. Limited assessment of the pulmonary arterial tree demonstrates no acute finding. Mediastinum/Nodes: Thyroid normal. No enlarged mediastinal, hilar, or axillary lymph nodes. No mediastinal hematoma or mass. Esophagus within normal limits. Lungs/Pleura: Tracheobronchial tree intact and patent. Lungs well inflated  bilaterally. Scattered fine subpleural fine reticular densities could reflect mild fibrotic lung changes and/or atelectasis. No consolidative opacity to suggest pneumonia. No pleural effusion or pulmonary edema. No pneumothorax. No worrisome pulmonary nodule or mass. Musculoskeletal: External soft tissues demonstrate no acute finding. No acute osseous abnormality. No discrete lytic or blastic osseous lesions. Median sternotomy wires intact. Review of the MIP images confirms the above findings. CTA ABDOMEN AND PELVIS FINDINGS VASCULAR Aorta: Normal intravascular enhancement seen throughout the intra-abdominal aorta without evidence for dissection or aneurysm. Moderate atherosclerotic change noted. Celiac: Celiac axis and its branch vessels widely patent without stenosis. SMA: SMA widely patent to its distal aspects without stenosis. Renals: Single renal arteries present bilaterally, both of which are widely patent and well perfused. IMA: IMA widely patent to its distal aspect. Inflow: Moderate to advanced atherosclerotic change seen throughout the iliac arteries bilaterally. No high-grade stenosis. There is an irregular somewhat globular focal outpouching arising from the distal left common iliac artery (series 5, images 161-165). This focal outpouching appears to at least be partially extraluminal in nature, extending beyond adjacent calcified atherosclerotic plaque, suggestive of a small pseudoaneurysm. This measures up to 1.7 cm in diameter. No appreciable raised dissection flap identified. Left external and internal iliac arteries are well perfused distally. Veins: No acute venous abnormality identified within the abdomen and pelvis. Review of the MIP images confirms the above findings. NON-VASCULAR Hepatobiliary: Liver demonstrates a normal contrast enhanced appearance. Gallbladder within normal limits. No biliary dilatation. Pancreas: Pancreas is diffusely atrophic in appearance with multifocal calcifications  seen involving the pancreatic head, uncinate process, as well as the body and tail, suggesting chronic pancreatitis. Pancreatic duct is dilated up to 11 mm in diameter. A prominent 11 mm calcification at the distal aspect of the pancreatic duct could conceivably be obstructive in nature (series 5, image 123). No other definite discernible pancreatic mass. Surrounding peripancreatic inflammation suggest acute on chronic pancreatitis. No loculated peripancreatic collections or other complication identified. Spleen: Spleen within normal limits. Adrenals/Urinary Tract: 2 cm enhancing left adrenal nodule noted (series 5, image 103), indeterminate. Adrenal glands otherwise unremarkable. Kidneys equal in size with symmetric enhancement. 3.8 cm simple cyst present within the interpolar right kidney. No nephrolithiasis, hydronephrosis, or focal enhancing renal mass. No hydroureter. Partially distended bladder within normal limits. Stomach/Bowel: Stomach moderately distended without acute abnormality. Mild hazy stranding surrounds the duodenum related to the inflammatory process within the adjacent pancreas. Superimposed small duodenal diverticulum noted. Small bowel of normal caliber without evidence for obstruction. No acute inflammatory changes seen about the bowels. Appendix within normal limits. Lymphatic: No adenopathy. Reproductive: Prostate enlarged measuring 5.7 cm in transverse diameter. Other: Small bilateral inguinal hernias noted, left larger than right. Small amount of associated free fluid noted within the right inguinal hernia (series 5, image 195). No free intraperitoneal air. Musculoskeletal: No acute osseous abnormality. No discrete lytic or blastic osseous lesions. 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 Review of the MIP images confirms the above findings. IMPRESSION: 1. No CTA evidence for dissection or other acute aortic pathology. 2. Findings compatible with acute  on chronic pancreatitis. Pancreatic duct dilated up to 11 mm, with possible 11 mm obstructive stone at  the pancreatic head. No definite obstructive mass. Further assessment with dedicated abdominal MRI could be performed for further evaluation as warranted. 3. 17 mm irregular focal outpouching arising from the distal common left iliac artery, suggestive of a pseudoaneurysm. 4. Cardiomegaly with diffuse 3 vessel coronary artery calcifications and moderate aortic atherosclerosis. No aneurysm. 5. Small bilateral inguinal hernias, with small amount of associated free fluid on the right. Electronically Signed   By: Jeannine Boga M.D.   On: 01/28/2018 22:06  [2 weeks]   Assessment: 73 year old male presenting with acute on chronic pancreatitis, findings of chronic pancreatic ductal dilation (previously noted on ultrasound and CT in 2004), and possible obstructive stone at the head of the pancreas.  Known previous history of alcohol abuse in the 80s, with chronic abstinence. Clinically doing better. This morning is his first tray with advance diet.  LFTs/lipase normal.    Plan: 1. Add Creon, starting at 72,000 units with meals and 36,000 units with snacks. Discharge on Creon. 2. Patient may be able to go home later this afternoon. Would like to see that he tolerates both breakfast and lunch without significant pain.  3. Office is working on referral to [facility].   Laureen Ochs. Bernarda Caffey [facility] [phone] 1/9/20209:34 AM     LOS: 3 days

## 2018-01-31 NOTE — Discharge Instructions (Signed)
Take Creon 2 capsules 3 times daily with meals and 1 capsule with snacks in between  Follow with Sharilyn Sites, MD in 5-7 days You have been referred urgently to [facility] gastroenterology.  Please get a complete blood count and chemistry panel checked by your Primary MD at your next visit, and again as instructed by your Primary MD. Please get your medications reviewed and adjusted by your Primary MD.  Please request your Primary MD to go over all Hospital Tests and Procedure/Radiological results at the follow up, please get all Hospital records sent to your Prim MD by signing hospital release before you go home.  If you had Pneumonia of Lung problems at the Hospital: Please get a 2 view Chest X ray done in 6-8 weeks after hospital discharge or sooner if instructed by your Primary MD.  If you have Congestive Heart Failure: Please call your Cardiologist or Primary MD anytime you have any of the following symptoms:  1) 3 pound weight gain in 24 hours or 5 pounds in 1 week  2) shortness of breath, with or without a dry hacking cough  3) swelling in the hands, feet or stomach  4) if you have to sleep on extra pillows at night in order to breathe  Follow cardiac low salt diet and 1.5 lit/day fluid restriction.  If you have diabetes Accuchecks 4 times/day, Once in AM empty stomach and then before each meal. Log in all results and show them to your primary doctor at your next visit. If any glucose reading is under 80 or above 300 call your primary MD immediately.  If you have Seizure/Convulsions/Epilepsy: Please do not drive, operate heavy machinery, participate in activities at heights or participate in high speed sports until you have seen by Primary MD or a Neurologist and advised to do so again.  If you had Gastrointestinal Bleeding: Please ask your Primary MD to check a complete blood count within one week of discharge or at your next visit. Your endoscopic/colonoscopic biopsies that  are pending at the time of discharge, will also need to followed by your Primary MD.  Get Medicines reviewed and adjusted. Please take all your medications with you for your next visit with your Primary MD  Please request your Primary MD to go over all hospital tests and procedure/radiological results at the follow up, please ask your Primary MD to get all Hospital records sent to his/her office.  If you experience worsening of your admission symptoms, develop shortness of breath, life threatening emergency, suicidal or homicidal thoughts you must seek medical attention immediately by calling 911 or calling your MD immediately  if symptoms less severe.  You must read complete instructions/literature along with all the possible adverse reactions/side effects for all the Medicines you take and that have been prescribed to you. Take any new Medicines after you have completely understood and accpet all the possible adverse reactions/side effects.   Do not drive or operate heavy machinery when taking Pain medications.   Do not take more than prescribed Pain, Sleep and Anxiety Medications  Special Instructions: If you have smoked or chewed Tobacco  in the last 2 yrs please stop smoking, stop any regular Alcohol  and or any Recreational drug use.  Wear Seat belts while driving.  Please note You were cared for by a hospitalist during your hospital stay. If you have any questions about your discharge medications or the care you received while you were in the hospital after you are discharged,  you can call the unit and asked to speak with the hospitalist on call if the hospitalist that took care of you is not available. Once you are discharged, your primary care physician will handle any further medical issues. Please note that NO REFILLS for any discharge medications will be authorized once you are discharged, as it is imperative that you return to your primary care physician (or establish a relationship  with a primary care physician if you do not have one) for your aftercare needs so that they can reassess your need for medications and monitor your lab values.  You can reach the hospitalist office at phone [phone] or fax [phone]   If you do not have a primary care physician, you can call [phone] for a physician referral.  Activity: As tolerated with Full fall precautions use walker/cane & assistance as needed  Diet: regular  Disposition Home

## 2018-01-31 NOTE — Telephone Encounter (Signed)
Urgent referral faxed to [facility] for EUS-/+ERCP.

## 2018-01-31 NOTE — Discharge Summary (Signed)
Physician Discharge Summary  [name] BSW:[id number] DOB: 12/04/1945 DOA: 01/28/2018  PCP: Sharilyn Sites, MD  Admit date: 01/28/2018 Discharge date: 01/31/2018  Admitted From: home Disposition:  home  Recommendations for Outpatient Follow-up:  1. Follow up with PCP in 1-2 weeks 2. Urgent referral was made by our gastroenterologist week for gastroenterology so the patient gets an appointment ASAP  Home Health: None Equipment/Devices: None  Discharge Condition: Stable CODE STATUS: Full code Diet recommendation: Low-fat  HPI: Per admitting MD, HPI: [name] is a 73 y.o. male with medical history significant of AICD, aortic stenosis, carotid bruits, fall, hyperlipidemia, hypertension, CAD, history of MI, ischemic cardiomyopathy, pancreatitis with history of remote ethanol abuse, sleep apnea, history of syncope, former smoker who is coming to the emergency department with complaints of sudden onset of epigastric abdominal pain, radiated to his back associated with nausea at around 1300 today.  He last day the previous evening.  He denies fever, chills, emesis, diarrhea, constipation, melena or hematochezia.  Complains of nocturia of 2-3 times a night, but denies dysuria, frequency or hematuria.  He denies chest pain, palpitations, dizziness, diaphoresis, PND or orthopnea.  No polyuria, polydipsia, polyphagia or blurred vision.  No pruritus or skin rashes. ED Course: Initial vital signs were 97.5 F, pulse 59, respirations 19, blood pressure 175/107 mmHg and O2 sat 98% on room air.  He received a 500 mL NS bolus, fentanyl 50 mcg and ondansetron 4 mg IVP x1 dose. Lab work shows an urinalysis with glucosuria of 50 and proteinuria of 30 mg/dL, but all other values are within normal limits.White counts 14.4, hemoglobin 15.0 g/dL and platelets 153.  Chemistry shows a glucose of 147 and phosphorus level of 2.2 mg/dL, but all other values, including troponin, LFTs and lipase level were within  normal limits. Imaging: CTA chest/abdomen/pelvis did not show any evidence of dissection or any other acute aortic pathology.  However findings were compatible with acute on chronic pancreatitis and dilated pancreatic duct up to 11 mm.  There is a possible obstructive stone at the pancreatic head.  No definite obstructing mass.  Further assessment recommended with MRI.  Please see images and full radiology report for further details.  Hospital Course: Principal Problem Acute on chronic pancreatitis -patient was admitted to hospital with abdominal pain, diagnosed with acute on chronic pancreatitis.  CT angiogram abdomen and pelvis--patent celiac, SMA, IMA, renal arteries;pancreatic findings as discussed above with concern for pancreatic duct stone.  Gastroenterology was consulted and followed patient while hospitalized, they recommend patient be evaluated at a tertiary center at [facility].  An urgent referral was made.  Patient improved with supportive treatment, his lipase is normal, he is able to tolerate a regular diet and his pain is resolved.  He is stable for home discharge with close outpatient follow-up at [facility].  He was also placed on Creon at gastroenterology recommendations.  Active problems Chronic systolic and diastolic CHF/ischemic cardiomyopathy/AICD -05/25/2015 Echo EF 25-30%, grade 1 DD, clinically compensated currently without significant fluid overload, home medications at discharge Hyperlipidemia -Continue statin, LDL 26 Aortic stenosis with prior AVR  -pericardial tissue valve [employer] 23 mm placed 09/2008 COPD -Stable on room air Impaired glucose tolerance -08/10/2016 hemoglobin A1c 6.2   Discharge Diagnoses:  Principal Problem:   Acute on chronic pancreatitis ([place]) Active Problems:   COPD (chronic obstructive pulmonary disease) ([place])   Hypertension   AICD (automatic cardioverter/defibrillator) present   Hyperlipidemia   Chronic combined systolic and  diastolic heart failure ([place])   Pancreatic duct stones     Discharge Instructions   Allergies as of 01/31/2018      Reactions   Cefazolin Rash      Medication List    TAKE these medications   acetaminophen 500 MG tablet Commonly known as:  TYLENOL Take 1,000 mg by mouth 2 (two) times daily.   aspirin 81 MG tablet Take 81 mg by mouth every morning.   atorvastatin 80 MG tablet Commonly known as:  LIPITOR Take 80 mg by mouth at dinner What changed:    how much to take  how to take this  when to take this  additional instructions   ipratropium 0.03 % nasal spray Commonly known as:  ATROVENT Place 2 sprays into both nostrils every 12 (twelve) hours.   lipase/protease/amylase 36000 UNITS Cpep capsule Commonly known as:  CREON Take 2 capsules (72,000 Units total) by mouth 3 (three) times daily with meals.   lipase/protease/amylase 36000 UNITS Cpep capsule Commonly known as:  CREON Take 1 capsule (36,000 Units total) by mouth with snacks.   lisinopril 2.5 MG tablet Commonly known as:  PRINIVIL,ZESTRIL TAKE 1 TABLET EVERY DAY  (ONLY TAKE IF SYSTOLIC BLOOD PRESSURE IS AT LEAST 100) What changed:  See the new instructions.   nitroGLYCERIN 0.4 MG SL tablet Commonly known as:  NITROSTAT Place 1 tablet (0.4 mg total) under the tongue every 5 (five) minutes as needed for chest pain.      Follow-up Information    Sharilyn Sites, MD. Schedule an appointment as soon as possible for a visit in 3 week(s).   Specialty:  Family Medicine Contact information: [address] [place] [address] [phone]        Arnoldo Lenis, MD .   Specialty:  Cardiology Contact information: [address] [place] [address] [phone]           Consultations:  Gastroenterology  Procedures/Studies:  Dg Chest 2 View  Result Date: 01/28/2018 CLINICAL DATA:  Chest pain. EXAM: CHEST - 2 VIEW COMPARISON:  Radiograph of Jun 04, 2015. FINDINGS: Stable  cardiomegaly. Status post coronary bypass graft. Stable position of single lead left-sided pacemaker. Status post aortic valve replacement. No pneumothorax or pleural effusion is noted. No acute pulmonary disease is noted. Bony thorax is unremarkable. IMPRESSION: No active cardiopulmonary disease. Electronically Signed   By: Marijo Conception, M.D.   On: 01/28/2018 18:25   Ct Angio Chest/abd/pel For Dissection W And/or Wo Contrast  Result Date: 01/28/2018 CLINICAL DATA:  Initial evaluation for acute chest and epigastric pain. EXAM: CT ANGIOGRAPHY CHEST, ABDOMEN AND PELVIS TECHNIQUE: Multidetector CT imaging through the chest, abdomen and pelvis was performed using the standard protocol during bolus administration of intravenous contrast. Multiplanar reconstructed images and MIPs were obtained and reviewed to evaluate the vascular anatomy. CONTRAST:  140mL ISOVUE-370 IOPAMIDOL (ISOVUE-370) INJECTION 76% COMPARISON:  Prior radiograph from earlier the same day. FINDINGS: CTA CHEST FINDINGS Cardiovascular: Precontrast imaging through the intrathoracic aorta demonstrates no mural thrombus or other acute finding. Moderate scattered atheromatous plaque noted. Postcontrast imaging demonstrates no evidence for dissection or other acute aortic pathology. Sequelae of prior CABG noted. Visualized great vessels intact and normal. Left-sided pacemaker/AICD noted. Cardiomegaly. Diffuse 3 vessel coronary artery calcifications. No pericardial effusion. Limited assessment of the pulmonary arterial tree demonstrates no acute finding. Mediastinum/Nodes: Thyroid normal. No enlarged mediastinal, hilar, or axillary lymph nodes. No mediastinal hematoma or mass. Esophagus within normal limits. Lungs/Pleura: Tracheobronchial tree intact and  patent. Lungs well inflated bilaterally. Scattered fine subpleural fine reticular densities could reflect mild fibrotic lung changes and/or atelectasis. No consolidative opacity to suggest pneumonia. No  pleural effusion or pulmonary edema. No pneumothorax. No worrisome pulmonary nodule or mass. Musculoskeletal: External soft tissues demonstrate no acute finding. No acute osseous abnormality. No discrete lytic or blastic osseous lesions. Median sternotomy wires intact. Review of the MIP images confirms the above findings. CTA ABDOMEN AND PELVIS FINDINGS VASCULAR Aorta: Normal intravascular enhancement seen throughout the intra-abdominal aorta without evidence for dissection or aneurysm. Moderate atherosclerotic change noted. Celiac: Celiac axis and its branch vessels widely patent without stenosis. SMA: SMA widely patent to its distal aspects without stenosis. Renals: Single renal arteries present bilaterally, both of which are widely patent and well perfused. IMA: IMA widely patent to its distal aspect. Inflow: Moderate to advanced atherosclerotic change seen throughout the iliac arteries bilaterally. No high-grade stenosis. There is an irregular somewhat globular focal outpouching arising from the distal left common iliac artery (series 5, images 161-165). This focal outpouching appears to at least be partially extraluminal in nature, extending beyond adjacent calcified atherosclerotic plaque, suggestive of a small pseudoaneurysm. This measures up to 1.7 cm in diameter. No appreciable raised dissection flap identified. Left external and internal iliac arteries are well perfused distally. Veins: No acute venous abnormality identified within the abdomen and pelvis. Review of the MIP images confirms the above findings. NON-VASCULAR Hepatobiliary: Liver demonstrates a normal contrast enhanced appearance. Gallbladder within normal limits. No biliary dilatation. Pancreas: Pancreas is diffusely atrophic in appearance with multifocal calcifications seen involving the pancreatic head, uncinate process, as well as the body and tail, suggesting chronic pancreatitis. Pancreatic duct is dilated up to 11 mm in diameter. A  prominent 11 mm calcification at the distal aspect of the pancreatic duct could conceivably be obstructive in nature (series 5, image 123). No other definite discernible pancreatic mass. Surrounding peripancreatic inflammation suggest acute on chronic pancreatitis. No loculated peripancreatic collections or other complication identified. Spleen: Spleen within normal limits. Adrenals/Urinary Tract: 2 cm enhancing left adrenal nodule noted (series 5, image 103), indeterminate. Adrenal glands otherwise unremarkable. Kidneys equal in size with symmetric enhancement. 3.8 cm simple cyst present within the interpolar right kidney. No nephrolithiasis, hydronephrosis, or focal enhancing renal mass. No hydroureter. Partially distended bladder within normal limits. Stomach/Bowel: Stomach moderately distended without acute abnormality. Mild hazy stranding surrounds the duodenum related to the inflammatory process within the adjacent pancreas. Superimposed small duodenal diverticulum noted. Small bowel of normal caliber without evidence for obstruction. No acute inflammatory changes seen about the bowels. Appendix within normal limits. Lymphatic: No adenopathy. Reproductive: Prostate enlarged measuring 5.7 cm in transverse diameter. Other: Small bilateral inguinal hernias noted, left larger than right. Small amount of associated free fluid noted within the right inguinal hernia (series 5, image 195). No free intraperitoneal air. Musculoskeletal: No acute osseous abnormality. No discrete lytic or blastic osseous lesions. 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 Review of the MIP images confirms the above findings. IMPRESSION: 1. No CTA evidence for dissection or other acute aortic pathology. 2. Findings compatible with acute on chronic pancreatitis. Pancreatic duct dilated up to 11 mm, with possible 11 mm obstructive stone at the pancreatic head. No definite obstructive mass. Further  assessment with dedicated abdominal MRI could be performed for further evaluation as warranted. 3. 17 mm irregular focal outpouching arising from the distal common left iliac artery, suggestive of a pseudoaneurysm. 4. Cardiomegaly with diffuse 3 vessel coronary artery  calcifications and moderate aortic atherosclerosis. No aneurysm. 5. Small bilateral inguinal hernias, with small amount of associated free fluid on the right. Electronically Signed   By: Jeannine Boga M.D.   On: 01/28/2018 22:06      Subjective: - no chest pain, shortness of breath, no abdominal pain, nausea or vomiting.   Discharge Exam: Vitals:   01/31/18 0450 01/31/18 1248  BP: 126/64 126/78  Pulse: (!) 58 72  Resp: 17 20  Temp: 98.1 F (36.7 C) 98.6 F (37 C)  SpO2: 95% 96%    General: Pt is alert, awake, not in acute distress Cardiovascular: RRR, S1/S2 +, no rubs, no gallops Respiratory: CTA bilaterally, no wheezing, no rhonchi Abdominal: Soft, NT, ND, bowel sounds + Extremities: no edema, no cyanosis    The results of significant diagnostics from this hospitalization (including imaging, microbiology, ancillary and laboratory) are listed below for reference.     Microbiology: No results found for this or any previous visit (from the past 240 hour(s)).   Labs: BNP (last 3 results) No results for input(s): BNP in the last 8760 hours. Basic Metabolic Panel: Recent Labs  Lab 01/28/18 1826 01/29/18 0513 01/30/18 0524 01/31/18 0618  NA 139 138 137 138  K 3.6 3.6 3.9 4.0  CL 106 107 107 107  CO2 27 22 22 23   GLUCOSE 147* 107* 73 93  BUN 19 14 20 16   CREATININE 0.76 0.79 0.79 0.79  CALCIUM 8.9 8.5* 8.7* 8.8*  MG 2.0  --   --   --   PHOS 2.2*  --   --   --    Liver Function Tests: Recent Labs  Lab 01/28/18 1826 01/29/18 0513 01/30/18 0524 01/31/18 0618  AST 25 20 23 22   ALT 25 21 20 20   ALKPHOS 57 51 47 47  BILITOT 0.7 1.2 1.5* 1.2  PROT 7.1 6.4* 6.5 6.6  ALBUMIN 4.2 3.6 3.6 3.5    Recent Labs  Lab 01/28/18 1826 01/29/18 0513 01/30/18 0524 01/31/18 0618  LIPASE 26 21 25 20    No results for input(s): AMMONIA in the last 168 hours. CBC: Recent Labs  Lab 01/28/18 1826 01/29/18 0513 01/30/18 0524 01/31/18 0618  WBC 14.4* 11.0* 7.5 8.2  NEUTROABS  --  9.2* 5.8 5.8  HGB 15.0 14.5 15.0 14.8  HCT 48.2 45.0 47.2 45.3  MCV 97.4 97.0 97.3 95.0  PLT 153 126* 123* 148*   Cardiac Enzymes: Recent Labs  Lab 01/28/18 1826  TROPONINI <0.03   BNP: Invalid input(s): POCBNP CBG: No results for input(s): GLUCAP in the last 168 hours. D-Dimer No results for input(s): DDIMER in the last 72 hours. Hgb A1c No results for input(s): HGBA1C in the last 72 hours. Lipid Profile Recent Labs    01/29/18 0513  CHOL 73  HDL 39*  LDLCALC 26  TRIG 39  CHOLHDL 1.9   Thyroid function studies No results for input(s): TSH, T4TOTAL, T3FREE, THYROIDAB in the last 72 hours.  Invalid input(s): FREET3 Anemia work up No results for input(s): VITAMINB12, FOLATE, FERRITIN, TIBC, IRON, RETICCTPCT in the last 72 hours. Urinalysis    Component Value Date/Time   COLORURINE YELLOW 01/28/2018 2030   APPEARANCEUR CLEAR 01/28/2018 2030   LABSPEC 1.021 01/28/2018 2030   PHURINE 8.0 01/28/2018 2030   GLUCOSEU 50 (A) 01/28/2018 2030   HGBUR NEGATIVE 01/28/2018 2030   [place] NEGATIVE 01/28/2018 2030   [place] 01/28/2018 2030   PROTEINUR 30 (A) 01/28/2018 2030   UROBILINOGEN 0.2 09/30/2008 2130  NITRITE NEGATIVE 01/28/2018 2030   LEUKOCYTESUR NEGATIVE 01/28/2018 2030   Sepsis Labs Invalid input(s): PROCALCITONIN,  WBC,  LACTICIDVEN   Time coordinating discharge: 35 minutes  SIGNED:  Marzetta Board, MD  [facility] 01/31/2018, 3:43 PM

## 2018-01-31 NOTE — Addendum Note (Signed)
Addended by: Hassan Rowan on: 01/31/2018 08:19 AM   Modules accepted: Orders

## 2018-01-31 NOTE — Telephone Encounter (Signed)
Please make urgent referral to [facility] GI for EUS+/-ERCP for acute on chronic pancreatitis with dilated pancreatic duct with possible 107mm obstructive stone at head of pancreas on CT imaging.

## 2018-02-01 ENCOUNTER — Telehealth: Payer: Self-pay | Admitting: Nurse Practitioner

## 2018-02-01 NOTE — Telephone Encounter (Signed)
Patient called and was recently d/c from the hospital with Dx pancreatitis. Was Rx'd Creon 72,000 units with meals and 36,000 units with snacks. Went to fill his Rx and was told it would cost $500 with Medicare. Not eligible for copay card. Offered patient samples x about 5-6 days to give [place] time to work on a solution.   AM: please investigate either patient assistance program vs. Tier exception, etc to help with cost. Let me know if you need anything from me or have any questions.  Cc: LSL who last saw patient in the hospital for [place].

## 2018-02-04 ENCOUNTER — Telehealth: Payer: Self-pay | Admitting: Internal Medicine

## 2018-02-04 NOTE — Telephone Encounter (Signed)
[phone] PLEASE CALL PATIENT, HE WAS GIVEN CREON SAMPLES AND SAID HE COULD NOT AFFORD A PRESCRIPTION.  HE ALSO STATES WE ARE SUPPOSED TO BE REFERRING HIM TO [facility]

## 2018-02-04 NOTE — Telephone Encounter (Signed)
Noted. Spoke with pt, he is aware that I will be checking into medication.

## 2018-02-04 NOTE — Telephone Encounter (Signed)
Checking into creon.

## 2018-02-05 DIAGNOSIS — Z881 Allergy status to other antibiotic agents status: Secondary | ICD-10-CM | POA: Diagnosis not present

## 2018-02-05 DIAGNOSIS — K860 Alcohol-induced chronic pancreatitis: Secondary | ICD-10-CM | POA: Diagnosis not present

## 2018-02-05 DIAGNOSIS — K8689 Other specified diseases of pancreas: Secondary | ICD-10-CM | POA: Diagnosis not present

## 2018-02-05 DIAGNOSIS — K861 Other chronic pancreatitis: Secondary | ICD-10-CM | POA: Diagnosis not present

## 2018-02-06 NOTE — Telephone Encounter (Signed)
Noted  

## 2018-02-07 DIAGNOSIS — E663 Overweight: Secondary | ICD-10-CM | POA: Diagnosis not present

## 2018-02-07 DIAGNOSIS — Z1389 Encounter for screening for other disorder: Secondary | ICD-10-CM | POA: Diagnosis not present

## 2018-02-07 DIAGNOSIS — K859 Acute pancreatitis without necrosis or infection, unspecified: Secondary | ICD-10-CM | POA: Diagnosis not present

## 2018-02-07 DIAGNOSIS — E782 Mixed hyperlipidemia: Secondary | ICD-10-CM | POA: Diagnosis not present

## 2018-02-07 DIAGNOSIS — Z6827 Body mass index (BMI) 27.0-27.9, adult: Secondary | ICD-10-CM | POA: Diagnosis not present

## 2018-02-07 DIAGNOSIS — K858 Other acute pancreatitis without necrosis or infection: Secondary | ICD-10-CM | POA: Diagnosis not present

## 2018-02-07 DIAGNOSIS — I10 Essential (primary) hypertension: Secondary | ICD-10-CM | POA: Diagnosis not present

## 2018-02-07 DIAGNOSIS — I509 Heart failure, unspecified: Secondary | ICD-10-CM | POA: Diagnosis not present

## 2018-02-07 DIAGNOSIS — K8689 Other specified diseases of pancreas: Secondary | ICD-10-CM | POA: Diagnosis not present

## 2018-02-13 ENCOUNTER — Other Ambulatory Visit: Payer: Self-pay | Admitting: Cardiology

## 2018-02-14 ENCOUNTER — Telehealth: Payer: Self-pay | Admitting: *Deleted

## 2018-02-14 DIAGNOSIS — I452 Bifascicular block: Secondary | ICD-10-CM | POA: Diagnosis not present

## 2018-02-14 DIAGNOSIS — R9431 Abnormal electrocardiogram [ECG] [EKG]: Secondary | ICD-10-CM | POA: Diagnosis not present

## 2018-02-14 DIAGNOSIS — Z01810 Encounter for preprocedural cardiovascular examination: Secondary | ICD-10-CM | POA: Diagnosis not present

## 2018-02-14 DIAGNOSIS — R001 Bradycardia, unspecified: Secondary | ICD-10-CM | POA: Diagnosis not present

## 2018-02-14 NOTE — Telephone Encounter (Signed)
Samples of creon 36, 000 units given to pt today.

## 2018-02-14 NOTE — Telephone Encounter (Signed)
When pt picked his samples of Creon up, I asked him if he had a drug plan care. Pt gave me his [facility] RX card. RX Bin [id number], Rx PCN [id number], [place]. Will work on [employer]

## 2018-02-14 NOTE — Telephone Encounter (Signed)
See note in care everywhere with recent OV with [facility] regarding EUS with possible ERCP referral.

## 2018-02-15 DIAGNOSIS — K208 Other esophagitis: Secondary | ICD-10-CM | POA: Diagnosis not present

## 2018-02-15 DIAGNOSIS — G8929 Other chronic pain: Secondary | ICD-10-CM | POA: Diagnosis not present

## 2018-02-15 DIAGNOSIS — R109 Unspecified abdominal pain: Secondary | ICD-10-CM | POA: Diagnosis not present

## 2018-02-15 DIAGNOSIS — K2270 Barrett's esophagus without dysplasia: Secondary | ICD-10-CM | POA: Diagnosis not present

## 2018-02-15 DIAGNOSIS — R1013 Epigastric pain: Secondary | ICD-10-CM | POA: Diagnosis not present

## 2018-02-15 DIAGNOSIS — B9681 Helicobacter pylori [H. pylori] as the cause of diseases classified elsewhere: Secondary | ICD-10-CM | POA: Diagnosis not present

## 2018-02-15 DIAGNOSIS — K869 Disease of pancreas, unspecified: Secondary | ICD-10-CM | POA: Diagnosis not present

## 2018-02-18 ENCOUNTER — Ambulatory Visit (INDEPENDENT_AMBULATORY_CARE_PROVIDER_SITE_OTHER): Payer: Medicare Other

## 2018-02-18 DIAGNOSIS — I5022 Chronic systolic (congestive) heart failure: Secondary | ICD-10-CM

## 2018-02-18 DIAGNOSIS — I255 Ischemic cardiomyopathy: Secondary | ICD-10-CM

## 2018-02-18 NOTE — Telephone Encounter (Signed)
PA submitted. Waiting on approval or denial. Pt is aware.

## 2018-02-19 NOTE — Progress Notes (Signed)
Remote ICD transmission.   

## 2018-02-21 LAB — CUP PACEART REMOTE DEVICE CHECK
Battery Remaining Longevity: 119 mo
Battery Voltage: 2.99 V
Brady Statistic RV Percent Paced: 0.03 %
HighPow Impedance: 44 Ohm
HighPow Impedance: 58 Ohm
Implantable Lead Implant Date: 20080624
Implantable Lead Location: 753860
Implantable Lead Model: 6947
Implantable Pulse Generator Implant Date: 20170714
Lead Channel Impedance Value: 418 Ohm
Lead Channel Impedance Value: 475 Ohm
Lead Channel Pacing Threshold Pulse Width: 0.4 ms
Lead Channel Sensing Intrinsic Amplitude: 5.625 mV
Lead Channel Sensing Intrinsic Amplitude: 5.625 mV
Lead Channel Setting Pacing Amplitude: 2.5 V
Lead Channel Setting Pacing Pulse Width: 0.4 ms
MDC IDC MSMT LEADCHNL RV PACING THRESHOLD AMPLITUDE: 0.375 V
MDC IDC SESS DTM: 20200127093825
MDC IDC SET LEADCHNL RV SENSING SENSITIVITY: 0.9 mV

## 2018-02-25 DIAGNOSIS — T82110A Breakdown (mechanical) of cardiac electrode, initial encounter: Secondary | ICD-10-CM | POA: Diagnosis not present

## 2018-02-25 DIAGNOSIS — K860 Alcohol-induced chronic pancreatitis: Secondary | ICD-10-CM | POA: Diagnosis not present

## 2018-02-25 DIAGNOSIS — D3502 Benign neoplasm of left adrenal gland: Secondary | ICD-10-CM | POA: Diagnosis not present

## 2018-02-25 DIAGNOSIS — Z951 Presence of aortocoronary bypass graft: Secondary | ICD-10-CM | POA: Diagnosis not present

## 2018-02-25 DIAGNOSIS — Z952 Presence of prosthetic heart valve: Secondary | ICD-10-CM | POA: Diagnosis not present

## 2018-02-25 DIAGNOSIS — Z45010 Encounter for checking and testing of cardiac pacemaker pulse generator [battery]: Secondary | ICD-10-CM | POA: Diagnosis not present

## 2018-03-04 NOTE — Telephone Encounter (Signed)
PA for Creon was denied for pt under part D Law. Pt hasn't tried any other medications like this before. Please advise on the next steps for the pt.

## 2018-03-07 NOTE — Telephone Encounter (Signed)
I'm not sure what they mean "denied due to Medicare Part D law"??  We can try ZenPep and see if they'll cover that. Zenpep 40,000 units two capsules with meals 3 times daily

## 2018-03-08 ENCOUNTER — Other Ambulatory Visit: Payer: Self-pay

## 2018-03-08 NOTE — Telephone Encounter (Signed)
Called Zenpep into [employer]. Spoke with pt, he saw the doctor that our office referred him to and he was given a medication. Pt is going to call back with that medication name. He was going to try it. Pt also is aware that he has Zenpep if needed at his pharmacy.

## 2018-03-11 NOTE — Telephone Encounter (Signed)
Pt returned my call. Lmom, waiting on a return call.

## 2018-03-11 NOTE — Telephone Encounter (Signed)
Spoke to pt. He is currently taking Flagyl, Clarithromycin, and Omeprazole. He wanted to know if he still needs the Creon. He is aware that his insurnace wouldn't cover this medication and he is also aware that Zenpep was called into his pharmacy in place of the Creon. I haven't received anything from the pharmacy yet and asked the pt to call and check on the price.

## 2018-03-15 NOTE — Telephone Encounter (Signed)
Yes, we still recommend pancreatic enzymes. Creon or Zenpep is acceptable, whichever is easier to obtain and/or cheapest with his insurance.

## 2018-03-18 NOTE — Telephone Encounter (Signed)
Spoke with pt. He is aware that he needs to take a pancreatic enzymes medication. He's going to check the price of Zenpep with his pharmacy.

## 2018-03-19 NOTE — Telephone Encounter (Signed)
FYI Dr. Rourk. 

## 2018-03-19 NOTE — Telephone Encounter (Addendum)
Reviewed records under [email or web address] records to be scanned. Details as below.  EGD 02/15/2018 showed "short segment Barrett's esophagus in the distal esophagus, multiple biopsies performed."  Biopsy showed squamocolumnar mucosa with chronic and acute inflammation.  Negative for intestinal metaplasia, dysplasia or malignancy.  Positive for H. Pylori.  MRCP dated 02/25/2018 showed constellation of findings suggestive of chronic pancreatitis.  Favor stricture of the proximal periampullary pancreatic duct.  Filling defect in the proximal pancreatic duct may reflect sludge ball or stone.  No appreciable pancreatic head mass.  Left adrenal adenoma seen.  Patient has follow up with GI at [facility].

## 2018-03-20 NOTE — Telephone Encounter (Signed)
Sounds like he needs on going GI care at [facility]

## 2018-04-17 ENCOUNTER — Telehealth: Payer: Self-pay | Admitting: Internal Medicine

## 2018-04-17 NOTE — Telephone Encounter (Signed)
Spoke with pt. He is aware that the Creon Nurse Ambassador enrollment program is who contacted him. They will try to help with getting his Creon medication. The pt was prescribed Zenpep when Creon was too expensive. Zenpep was expensive as well so I submitted pt to the [employer] for Creon.

## 2018-04-17 NOTE — Telephone Encounter (Signed)
[phone]  Patient said his drug company called him with a question about his pancreatitis medications.  What it is and who called it in.

## 2018-04-18 ENCOUNTER — Telehealth: Payer: Self-pay | Admitting: Internal Medicine

## 2018-04-18 NOTE — Telephone Encounter (Signed)
Spoke with pt. The Ambassador program for Creon called him and asked him to complete pt assistance forms and submit them. Offered to mail forms to pt. Pt would prefer to sign forms at office.

## 2018-04-18 NOTE — Telephone Encounter (Signed)
PATIENT NEEDS APPLICATION FORM FOR CREON ASSISTANCE SENT TO HIM.Marland [phone]

## 2018-04-30 NOTE — Telephone Encounter (Signed)
Pt brought samples by to fax to the [facility] program. Samples were also given of Zenpep 40,000 units which was ok per EG. See previous notes. Pt has had a hard time with getting medication and needs a pancreatic enzyme. Faxing papers.

## 2018-05-07 DIAGNOSIS — F1011 Alcohol abuse, in remission: Secondary | ICD-10-CM | POA: Diagnosis not present

## 2018-05-07 DIAGNOSIS — Z87891 Personal history of nicotine dependence: Secondary | ICD-10-CM | POA: Diagnosis not present

## 2018-05-07 DIAGNOSIS — K860 Alcohol-induced chronic pancreatitis: Secondary | ICD-10-CM | POA: Diagnosis not present

## 2018-05-20 ENCOUNTER — Ambulatory Visit (INDEPENDENT_AMBULATORY_CARE_PROVIDER_SITE_OTHER): Payer: Medicare Other | Admitting: *Deleted

## 2018-05-20 ENCOUNTER — Other Ambulatory Visit: Payer: Self-pay

## 2018-05-20 DIAGNOSIS — I5022 Chronic systolic (congestive) heart failure: Secondary | ICD-10-CM | POA: Diagnosis not present

## 2018-05-20 DIAGNOSIS — I255 Ischemic cardiomyopathy: Secondary | ICD-10-CM

## 2018-05-20 LAB — CUP PACEART REMOTE DEVICE CHECK
Battery Remaining Longevity: 115 mo
Battery Voltage: 2.99 V
Brady Statistic RV Percent Paced: 0.04 %
Date Time Interrogation Session: 20200427082207
HighPow Impedance: 45 Ohm
HighPow Impedance: 57 Ohm
Implantable Lead Implant Date: 20080624
Implantable Lead Location: 753860
Implantable Lead Model: 6947
Implantable Pulse Generator Implant Date: 20170714
Lead Channel Impedance Value: 475 Ohm
Lead Channel Impedance Value: 513 Ohm
Lead Channel Pacing Threshold Amplitude: 0.5 V
Lead Channel Pacing Threshold Pulse Width: 0.4 ms
Lead Channel Sensing Intrinsic Amplitude: 7.25 mV
Lead Channel Sensing Intrinsic Amplitude: 7.25 mV
Lead Channel Setting Pacing Amplitude: 2.5 V
Lead Channel Setting Pacing Pulse Width: 0.4 ms
Lead Channel Setting Sensing Sensitivity: 0.9 mV

## 2018-05-27 NOTE — Telephone Encounter (Signed)
Denial letter received from [employer] [facility] assist. They denied request since pt has insurance. We were told when the drug reps came in that you could apply for the ambassador program with or without insurance. Pt is aware that he can send in copies of all his medical bills/pharmacy and medical to be considered for further eval. i'm not sure what else can be done to get pt approved for this medication.

## 2018-05-28 NOTE — Progress Notes (Signed)
Remote pacemaker transmission.   

## 2018-05-28 NOTE — Telephone Encounter (Signed)
It looks like we already submitted to the [employer] program (per previous notes?). Can we call the Creon rep and see if they can assist?

## 2018-06-06 NOTE — Telephone Encounter (Signed)
Spoke with [place], pt was sent a medical out of pocket expense for 1 year. Pt is to fill the sheet out and send it back to [facility] assist.

## 2018-06-06 NOTE — Telephone Encounter (Signed)
Left  A message for a Creon specialist of the Ambassador program. Pt received some more paperwork from them. I need to inquire on the next steps to help pt receive his medication.

## 2018-06-06 NOTE — Telephone Encounter (Signed)
Called pt, he will call back when he leaves the doctors office.

## 2018-06-06 NOTE — Telephone Encounter (Signed)
Spoke to someone from the [employer], I need to check with the pt assistance program. The Creon Ambassador help pts find a program to receive their medication.

## 2018-06-07 ENCOUNTER — Ambulatory Visit: Payer: Medicare Other | Admitting: Cardiology

## 2018-06-10 ENCOUNTER — Telehealth: Payer: Self-pay | Admitting: Internal Medicine

## 2018-06-10 NOTE — Telephone Encounter (Signed)
Pt has questions about papers for his medication and wants to see AM face to face to help with fill completing form and answer his questions. He is going to take a shower and asked for AM to call him in 30 minutes. I told him that he could speak with her and work out how they can see each other with completing papers. [phone]

## 2018-06-10 NOTE — Telephone Encounter (Signed)
Spoke with pt and he is going to call before he brings his paper work up to the office to be faxed to the [place].

## 2018-06-11 ENCOUNTER — Telehealth: Payer: Self-pay | Admitting: Internal Medicine

## 2018-06-11 NOTE — Telephone Encounter (Signed)
Pt is going to stop by tomorrow to get help with his paper work.

## 2018-06-11 NOTE — Telephone Encounter (Signed)
Patient returned call

## 2018-06-12 ENCOUNTER — Encounter: Payer: Self-pay | Admitting: Internal Medicine

## 2018-06-12 ENCOUNTER — Telehealth: Payer: Self-pay | Admitting: Internal Medicine

## 2018-06-12 NOTE — Telephone Encounter (Signed)
[phone]  Please call patient about helping him fill out assistance papers

## 2018-06-12 NOTE — Telephone Encounter (Signed)
Lmom, waiting on a return call.  

## 2018-06-12 NOTE — Telephone Encounter (Signed)
Spoke with pt. He is coming tomorrow 06/13/18 @ 8:30.

## 2018-06-20 ENCOUNTER — Telehealth: Payer: Self-pay | Admitting: Internal Medicine

## 2018-06-20 NOTE — Telephone Encounter (Signed)
Patient called and stated [facility] has not received the fax that was coming from here regarding patient financial assistance

## 2018-06-20 NOTE — Telephone Encounter (Signed)
Papers explaining pts financial cost were faxed to pt assistance [employer]. Waiting on a reply.

## 2018-06-20 NOTE — Telephone Encounter (Signed)
Spoke with pt. Faxed was sent this morning and received.

## 2018-08-19 ENCOUNTER — Ambulatory Visit (INDEPENDENT_AMBULATORY_CARE_PROVIDER_SITE_OTHER): Payer: Medicare Other | Admitting: *Deleted

## 2018-08-19 DIAGNOSIS — I255 Ischemic cardiomyopathy: Secondary | ICD-10-CM

## 2018-08-19 DIAGNOSIS — I5022 Chronic systolic (congestive) heart failure: Secondary | ICD-10-CM

## 2018-08-19 LAB — CUP PACEART REMOTE DEVICE CHECK
Battery Remaining Longevity: 112 mo
Battery Voltage: 2.99 V
Brady Statistic RV Percent Paced: 0.19 %
Date Time Interrogation Session: 20200727083824
HighPow Impedance: 44 Ohm
HighPow Impedance: 57 Ohm
Implantable Lead Implant Date: 20080624
Implantable Lead Location: 753860
Implantable Lead Model: 6947
Implantable Pulse Generator Implant Date: 20170714
Lead Channel Impedance Value: 418 Ohm
Lead Channel Impedance Value: 475 Ohm
Lead Channel Pacing Threshold Amplitude: 0.5 V
Lead Channel Pacing Threshold Pulse Width: 0.4 ms
Lead Channel Sensing Intrinsic Amplitude: 6.375 mV
Lead Channel Sensing Intrinsic Amplitude: 6.375 mV
Lead Channel Setting Pacing Amplitude: 2.5 V
Lead Channel Setting Pacing Pulse Width: 0.4 ms
Lead Channel Setting Sensing Sensitivity: 0.9 mV

## 2018-08-20 ENCOUNTER — Ambulatory Visit: Payer: Medicare Other | Admitting: Cardiology
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
## 2018-08-22 DIAGNOSIS — Z6837 Body mass index (BMI) 37.0-37.9, adult: Secondary | ICD-10-CM | POA: Diagnosis not present

## 2018-08-22 DIAGNOSIS — Z0000 Encounter for general adult medical examination without abnormal findings: Secondary | ICD-10-CM | POA: Diagnosis not present

## 2018-09-06 ENCOUNTER — Encounter: Payer: Self-pay | Admitting: Cardiology

## 2018-09-06 NOTE — Progress Notes (Signed)
Remote ICD transmission.   

## 2018-10-23 DIAGNOSIS — E782 Mixed hyperlipidemia: Secondary | ICD-10-CM | POA: Diagnosis not present

## 2018-10-23 DIAGNOSIS — G894 Chronic pain syndrome: Secondary | ICD-10-CM | POA: Diagnosis not present

## 2018-10-23 DIAGNOSIS — I10 Essential (primary) hypertension: Secondary | ICD-10-CM | POA: Diagnosis not present

## 2018-11-19 ENCOUNTER — Ambulatory Visit (INDEPENDENT_AMBULATORY_CARE_PROVIDER_SITE_OTHER): Payer: Medicare Other | Admitting: *Deleted

## 2018-11-19 DIAGNOSIS — I5042 Chronic combined systolic (congestive) and diastolic (congestive) heart failure: Secondary | ICD-10-CM | POA: Diagnosis not present

## 2018-11-19 DIAGNOSIS — I255 Ischemic cardiomyopathy: Secondary | ICD-10-CM

## 2018-11-20 LAB — CUP PACEART REMOTE DEVICE CHECK
Battery Remaining Longevity: 109 mo
Battery Voltage: 2.99 V
Brady Statistic RV Percent Paced: 0.13 %
Date Time Interrogation Session: 20201027062824
HighPow Impedance: 48 Ohm
HighPow Impedance: 64 Ohm
Implantable Lead Implant Date: 20080624
Implantable Lead Location: 753860
Implantable Lead Model: 6947
Implantable Pulse Generator Implant Date: 20170714
Lead Channel Impedance Value: 475 Ohm
Lead Channel Impedance Value: 532 Ohm
Lead Channel Pacing Threshold Amplitude: 0.375 V
Lead Channel Pacing Threshold Pulse Width: 0.4 ms
Lead Channel Sensing Intrinsic Amplitude: 9.375 mV
Lead Channel Sensing Intrinsic Amplitude: 9.375 mV
Lead Channel Setting Pacing Amplitude: 2.5 V
Lead Channel Setting Pacing Pulse Width: 0.4 ms
Lead Channel Setting Sensing Sensitivity: 0.9 mV

## 2018-12-07 ENCOUNTER — Other Ambulatory Visit: Payer: Self-pay | Admitting: Cardiology

## 2018-12-10 NOTE — Progress Notes (Signed)
Remote ICD transmission.   

## 2018-12-23 ENCOUNTER — Telehealth: Payer: Self-pay

## 2018-12-23 DIAGNOSIS — I10 Essential (primary) hypertension: Secondary | ICD-10-CM | POA: Diagnosis not present

## 2018-12-23 DIAGNOSIS — K861 Other chronic pancreatitis: Secondary | ICD-10-CM | POA: Diagnosis not present

## 2018-12-23 DIAGNOSIS — I2510 Atherosclerotic heart disease of native coronary artery without angina pectoris: Secondary | ICD-10-CM | POA: Diagnosis not present

## 2018-12-23 DIAGNOSIS — I714 Abdominal aortic aneurysm, without rupture: Secondary | ICD-10-CM | POA: Diagnosis not present

## 2018-12-23 NOTE — Telephone Encounter (Signed)

## 2018-12-25 ENCOUNTER — Telehealth (INDEPENDENT_AMBULATORY_CARE_PROVIDER_SITE_OTHER): Payer: Medicare Other | Admitting: Cardiology

## 2018-12-25 ENCOUNTER — Encounter: Payer: Self-pay | Admitting: Cardiology

## 2018-12-25 VITALS — BP 162/86 | HR 49

## 2018-12-25 DIAGNOSIS — G4730 Sleep apnea, unspecified: Secondary | ICD-10-CM

## 2018-12-25 DIAGNOSIS — I5022 Chronic systolic (congestive) heart failure: Secondary | ICD-10-CM | POA: Diagnosis not present

## 2018-12-25 DIAGNOSIS — I2510 Atherosclerotic heart disease of native coronary artery without angina pectoris: Secondary | ICD-10-CM

## 2018-12-25 DIAGNOSIS — R001 Bradycardia, unspecified: Secondary | ICD-10-CM

## 2018-12-25 DIAGNOSIS — I10 Essential (primary) hypertension: Secondary | ICD-10-CM

## 2018-12-25 DIAGNOSIS — E782 Mixed hyperlipidemia: Secondary | ICD-10-CM
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
## 2018-12-25 NOTE — Patient Instructions (Signed)
Medication Instructions:  Your physician recommends that you continue on your current medications as directed. Please refer to the Current Medication list given to you today.   Labwork: I will request labs form pcp   Testing/Procedures: none  Follow-Up: Your physician wants you to follow-up in: 6 months.  You will receive a reminder letter in the mail two months in advance. If you don't receive a letter, please call our office to schedule the follow-up appointment.   Any Other Special Instructions Will Be Listed Below (If Applicable).  You have been referred to DR. TRACI TURNER for evaluation for sleep apnea. They will contact you.    If you need a refill on your cardiac medications before your next appointment, please call your pharmacy.

## 2018-12-25 NOTE — Addendum Note (Signed)
Addended by: Debbora Lacrosse R on: 12/25/2018 11:28 AM   Modules accepted: Orders

## 2019-01-20 IMAGING — DX DG CHEST 2V
2 series · 2 of 2 positions shown · non-contrast
Comparison: Radiograph June 04, 2015.

CLINICAL DATA: Chest pain.

EXAM:
CHEST - 2 VIEW

[chest lat]
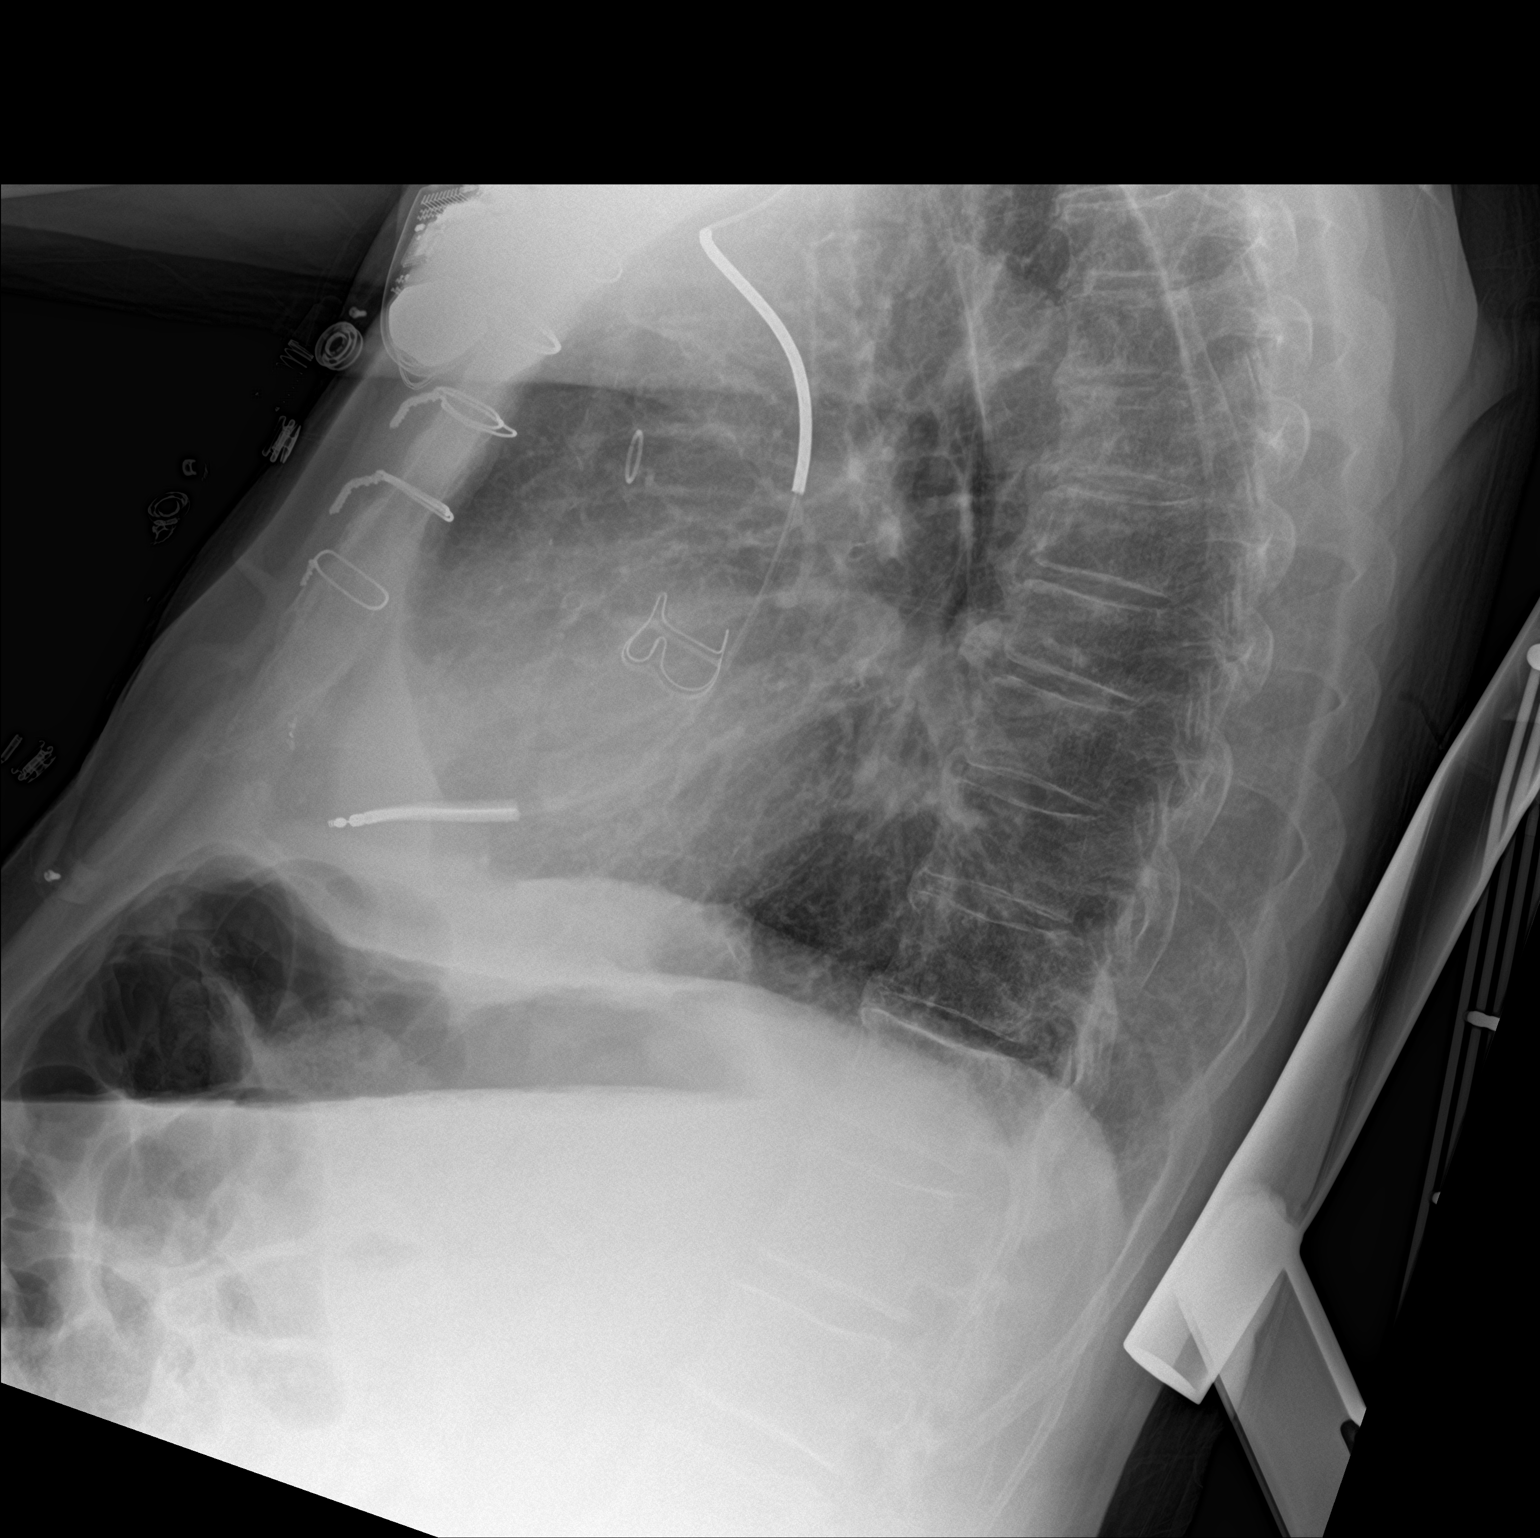

[chest ap]
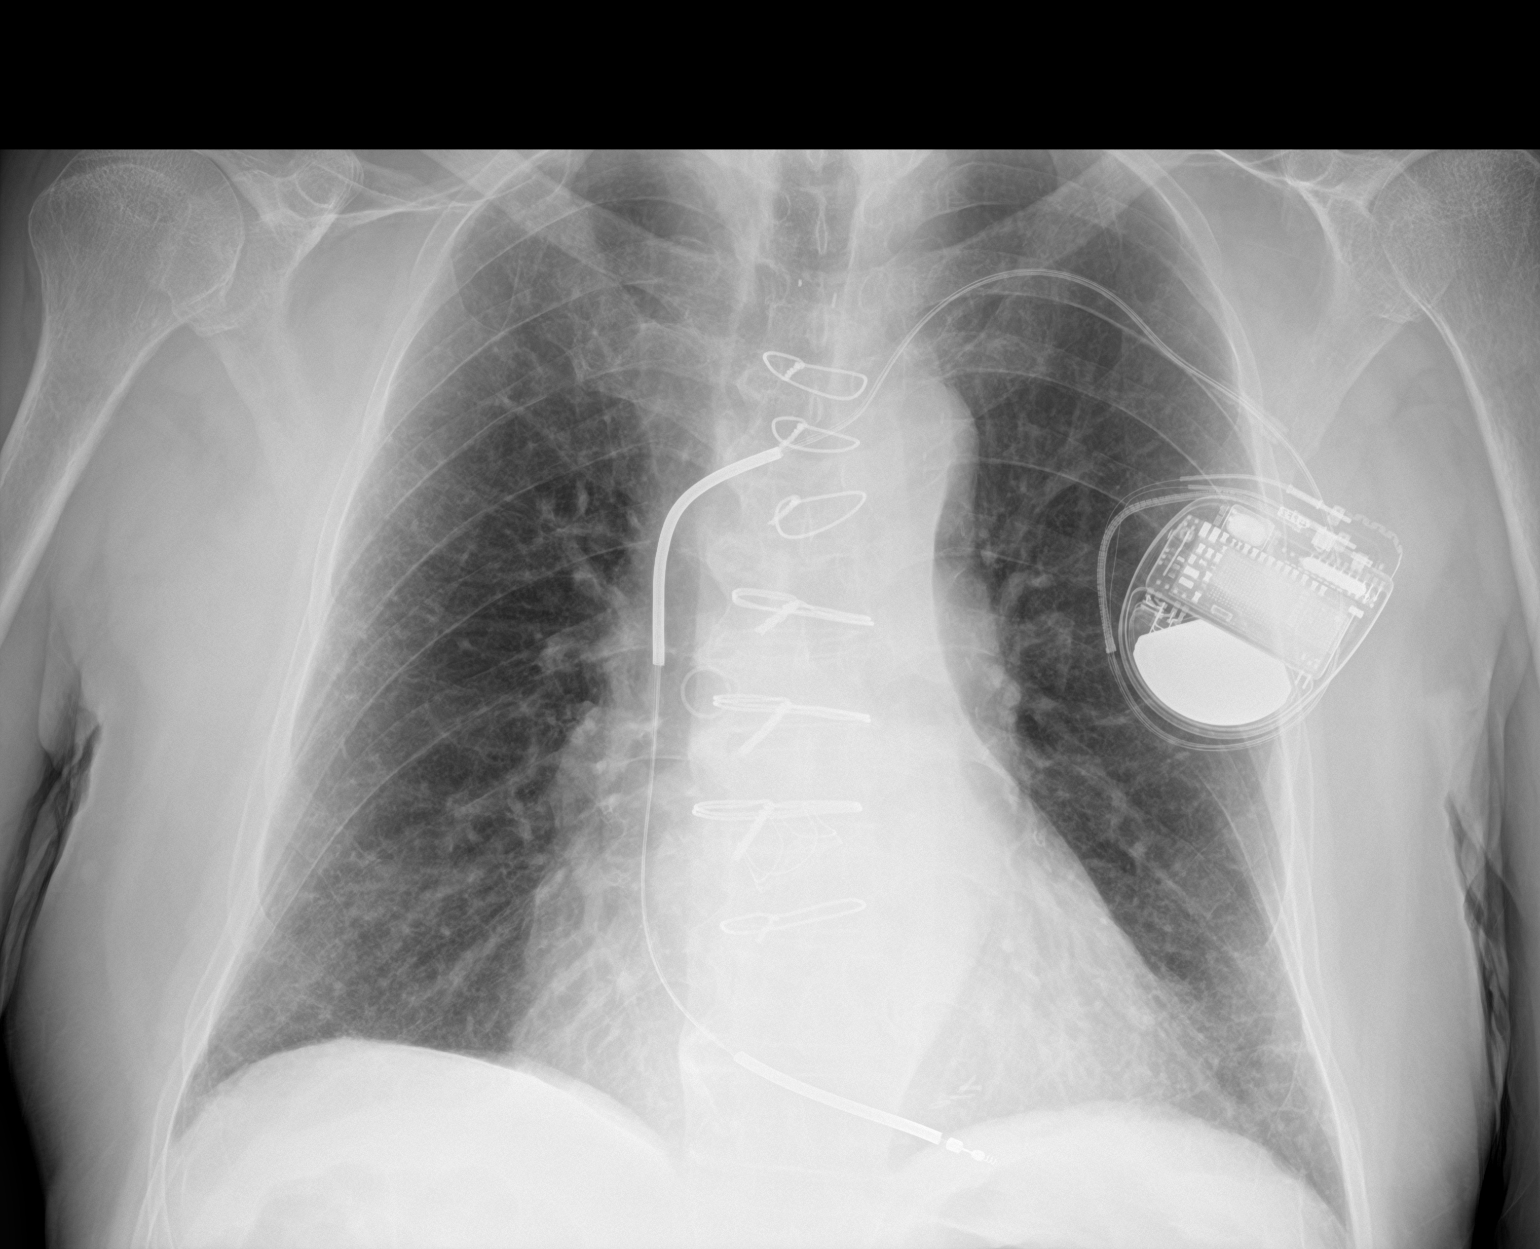

[2 of 2 positions shown; findings below may reference images not displayed]

FINDINGS: Stable cardiomegaly. Status post coronary bypass graft. Stable
position of single lead left-sided pacemaker. Status post aortic
valve replacement. No pneumothorax or pleural effusion is noted. No
acute pulmonary disease is noted. Bony thorax is unremarkable.
IMPRESSION: No active cardiopulmonary disease.

## 2019-01-21 ENCOUNTER — Telehealth: Payer: Self-pay | Admitting: *Deleted

## 2019-01-21 NOTE — Telephone Encounter (Signed)

## 2019-01-23 DIAGNOSIS — I2510 Atherosclerotic heart disease of native coronary artery without angina pectoris: Secondary | ICD-10-CM | POA: Diagnosis not present

## 2019-01-23 DIAGNOSIS — K861 Other chronic pancreatitis: Secondary | ICD-10-CM | POA: Diagnosis not present

## 2019-01-23 DIAGNOSIS — I10 Essential (primary) hypertension: Secondary | ICD-10-CM | POA: Diagnosis not present

## 2019-01-23 DIAGNOSIS — K8681 Exocrine pancreatic insufficiency: Secondary | ICD-10-CM | POA: Diagnosis not present
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
## 2019-02-14 ENCOUNTER — Other Ambulatory Visit: Payer: Self-pay

## 2019-02-14 ENCOUNTER — Telehealth (INDEPENDENT_AMBULATORY_CARE_PROVIDER_SITE_OTHER): Payer: Medicare Other | Admitting: Cardiology

## 2019-02-14 ENCOUNTER — Encounter: Payer: Self-pay | Admitting: Cardiology

## 2019-02-14 VITALS — BP 142/94 | Ht 71.0 in | Wt 196.0 lb

## 2019-02-14 DIAGNOSIS — E669 Obesity, unspecified: Secondary | ICD-10-CM | POA: Diagnosis not present

## 2019-02-14 DIAGNOSIS — G4730 Sleep apnea, unspecified: Secondary | ICD-10-CM

## 2019-02-14 DIAGNOSIS — I10 Essential (primary) hypertension: Secondary | ICD-10-CM | POA: Diagnosis not present

## 2019-02-17 ENCOUNTER — Other Ambulatory Visit: Payer: Self-pay

## 2019-02-17 ENCOUNTER — Ambulatory Visit: Payer: Medicare Other | Attending: Internal Medicine

## 2019-02-17 DIAGNOSIS — Z20822 Contact with and (suspected) exposure to covid-19: Secondary | ICD-10-CM | POA: Diagnosis not present

## 2019-02-18 ENCOUNTER — Ambulatory Visit (INDEPENDENT_AMBULATORY_CARE_PROVIDER_SITE_OTHER): Payer: Medicare Other | Admitting: *Deleted

## 2019-02-18 DIAGNOSIS — I5042 Chronic combined systolic (congestive) and diastolic (congestive) heart failure: Secondary | ICD-10-CM

## 2019-02-18 LAB — NOVEL CORONAVIRUS, NAA: SARS-CoV-2, NAA: NOT DETECTED

## 2019-02-19 LAB — CUP PACEART REMOTE DEVICE CHECK
Battery Remaining Longevity: 105 mo
Battery Voltage: 2.98 V
Brady Statistic RV Percent Paced: 0.1 %
Date Time Interrogation Session: 20210126033425
HighPow Impedance: 46 Ohm
HighPow Impedance: 58 Ohm
Implantable Lead Implant Date: 20080624
Implantable Lead Location: 753860
Implantable Lead Model: 6947
Implantable Pulse Generator Implant Date: 20170714
Lead Channel Impedance Value: 418 Ohm
Lead Channel Impedance Value: 475 Ohm
Lead Channel Pacing Threshold Amplitude: 0.375 V
Lead Channel Pacing Threshold Pulse Width: 0.4 ms
Lead Channel Sensing Intrinsic Amplitude: 7.625 mV
Lead Channel Sensing Intrinsic Amplitude: 7.625 mV
Lead Channel Setting Pacing Amplitude: 2.5 V
Lead Channel Setting Pacing Pulse Width: 0.4 ms
Lead Channel Setting Sensing Sensitivity: 0.9 mV

## 2019-02-23 DIAGNOSIS — I2510 Atherosclerotic heart disease of native coronary artery without angina pectoris: Secondary | ICD-10-CM | POA: Diagnosis not present

## 2019-02-23 DIAGNOSIS — K8681 Exocrine pancreatic insufficiency: Secondary | ICD-10-CM | POA: Diagnosis not present

## 2019-02-23 DIAGNOSIS — K861 Other chronic pancreatitis: Secondary | ICD-10-CM | POA: Diagnosis not present

## 2019-02-23 DIAGNOSIS — I10 Essential (primary) hypertension: Secondary | ICD-10-CM | POA: Diagnosis not present

## 2019-03-14 ENCOUNTER — Emergency Department (HOSPITAL_COMMUNITY): Payer: Medicare Other

## 2019-03-14 ENCOUNTER — Encounter (HOSPITAL_COMMUNITY): Payer: Self-pay

## 2019-03-14 ENCOUNTER — Other Ambulatory Visit: Payer: Self-pay

## 2019-03-14 ENCOUNTER — Observation Stay (HOSPITAL_COMMUNITY)
Admission: EM | Admit: 2019-03-14 | Discharge: 2019-03-15 | Disposition: A | Discharge status: Home / Self Care | Payer: Medicare Other | Attending: Cardiovascular Disease | Admitting: Cardiovascular Disease

## 2019-03-14 DIAGNOSIS — Z79899 Other long term (current) drug therapy: Secondary | ICD-10-CM | POA: Diagnosis not present

## 2019-03-14 DIAGNOSIS — Z7982 Long term (current) use of aspirin: Secondary | ICD-10-CM | POA: Diagnosis not present

## 2019-03-14 DIAGNOSIS — I2510 Atherosclerotic heart disease of native coronary artery without angina pectoris: Secondary | ICD-10-CM | POA: Diagnosis not present

## 2019-03-14 DIAGNOSIS — R0689 Other abnormalities of breathing: Secondary | ICD-10-CM | POA: Diagnosis not present

## 2019-03-14 DIAGNOSIS — E785 Hyperlipidemia, unspecified: Secondary | ICD-10-CM | POA: Insufficient documentation

## 2019-03-14 DIAGNOSIS — I472 Ventricular tachycardia, unspecified: Secondary | ICD-10-CM

## 2019-03-14 DIAGNOSIS — I5042 Chronic combined systolic (congestive) and diastolic (congestive) heart failure: Secondary | ICD-10-CM | POA: Insufficient documentation

## 2019-03-14 DIAGNOSIS — I110 Hypertensive heart disease with heart failure: Secondary | ICD-10-CM | POA: Insufficient documentation

## 2019-03-14 DIAGNOSIS — R0789 Other chest pain: Secondary | ICD-10-CM | POA: Diagnosis not present

## 2019-03-14 DIAGNOSIS — Z87891 Personal history of nicotine dependence: Secondary | ICD-10-CM | POA: Insufficient documentation

## 2019-03-14 DIAGNOSIS — Z951 Presence of aortocoronary bypass graft: Secondary | ICD-10-CM | POA: Insufficient documentation

## 2019-03-14 DIAGNOSIS — Z4502 Encounter for adjustment and management of automatic implantable cardiac defibrillator: Secondary | ICD-10-CM | POA: Insufficient documentation

## 2019-03-14 DIAGNOSIS — R002 Palpitations: Secondary | ICD-10-CM | POA: Diagnosis not present

## 2019-03-14 DIAGNOSIS — J449 Chronic obstructive pulmonary disease, unspecified: Secondary | ICD-10-CM | POA: Insufficient documentation

## 2019-03-14 DIAGNOSIS — I252 Old myocardial infarction: Secondary | ICD-10-CM | POA: Diagnosis not present

## 2019-03-14 DIAGNOSIS — R000 Tachycardia, unspecified: Secondary | ICD-10-CM | POA: Diagnosis present

## 2019-03-14 DIAGNOSIS — Z95810 Presence of automatic (implantable) cardiac defibrillator: Secondary | ICD-10-CM | POA: Insufficient documentation

## 2019-03-14 DIAGNOSIS — I10 Essential (primary) hypertension: Secondary | ICD-10-CM | POA: Diagnosis not present

## 2019-03-14 DIAGNOSIS — Z20822 Contact with and (suspected) exposure to covid-19: Secondary | ICD-10-CM | POA: Diagnosis not present

## 2019-03-14 DIAGNOSIS — R0989 Other specified symptoms and signs involving the circulatory and respiratory systems: Secondary | ICD-10-CM | POA: Diagnosis not present

## 2019-03-14 DIAGNOSIS — I4510 Unspecified right bundle-branch block: Secondary | ICD-10-CM | POA: Diagnosis not present

## 2019-03-14 DIAGNOSIS — R110 Nausea: Secondary | ICD-10-CM | POA: Diagnosis not present

## 2019-03-14 HISTORY — DX: Atherosclerotic heart disease of native coronary artery without angina pectoris: I25.10

## 2019-03-14 HISTORY — DX: Chronic combined systolic (congestive) and diastolic (congestive) heart failure: I50.42

## 2019-03-14 LAB — CBC WITH DIFFERENTIAL/PLATELET
Abs Immature Granulocytes: 0.06 10*3/uL (ref 0.00–0.07)
Basophils Absolute: 0 10*3/uL (ref 0.0–0.1)
Basophils Relative: 1 %
Eosinophils Absolute: 0.2 10*3/uL (ref 0.0–0.5)
Eosinophils Relative: 3 %
HCT: 48.2 % (ref 39.0–52.0)
Hemoglobin: 15.8 g/dL (ref 13.0–17.0)
Immature Granulocytes: 1 %
Lymphocytes Relative: 19 %
Lymphs Abs: 1.4 10*3/uL (ref 0.7–4.0)
MCH: 31.7 pg (ref 26.0–34.0)
MCHC: 32.8 g/dL (ref 30.0–36.0)
MCV: 96.8 fL (ref 80.0–100.0)
Monocytes Absolute: 0.6 10*3/uL (ref 0.1–1.0)
Monocytes Relative: 8 %
Neutro Abs: 5.1 10*3/uL (ref 1.7–7.7)
Neutrophils Relative %: 68 %
Platelets: 146 10*3/uL — ABNORMAL LOW (ref 150–400)
RBC: 4.98 MIL/uL (ref 4.22–5.81)
RDW: 12.5 % (ref 11.5–15.5)
WBC: 7.3 10*3/uL (ref 4.0–10.5)
nRBC: 0 % (ref 0.0–0.2)

## 2019-03-14 LAB — TROPONIN I (HIGH SENSITIVITY)
Troponin I (High Sensitivity): 81 ng/L — ABNORMAL HIGH (ref <18)
Troponin I (High Sensitivity): 210 ng/L (ref <18)
Troponin I (High Sensitivity): 185 ng/L (ref <18)

## 2019-03-14 LAB — BASIC METABOLIC PANEL
Anion gap: 9 (ref 5–15)
BUN: 14 mg/dL (ref 8–23)
CO2: 25 mmol/L (ref 22–32)
Calcium: 8.9 mg/dL (ref 8.9–10.3)
Chloride: 105 mmol/L (ref 98–111)
Creatinine, Ser: 0.94 mg/dL (ref 0.61–1.24)
GFR calc Af Amer: >60 mL/min (ref >60)
GFR calc non Af Amer: >60 mL/min (ref >60)
Glucose, Bld: 110 mg/dL — ABNORMAL HIGH (ref 70–99)
Potassium: 4.1 mmol/L (ref 3.5–5.1)
Sodium: 139 mmol/L (ref 135–145)
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
## 2019-03-14 NOTE — ED Triage Notes (Signed)
EMS reports pt called d/t ICD firing. Pt got device in 2018. Pt has RBB. V/S stable, no firing with ems.

## 2019-03-14 NOTE — ED Notes (Signed)
Report attempted 

## 2019-03-14 NOTE — ED Provider Notes (Signed)
Medical screening examination/treatment/procedure(s) were conducted as a shared visit with non-physician practitioner(s) and myself.  I personally evaluated the patient during the encounter. Briefly, the patient is a 74 y.o. male with history of ischemic cardiomyopathy with the AICD who presents to the emergency department after his defibrillator fired.  Patient with normal vitals upon arrival.  EKG shows sinus rhythm.  No significant ischemic changes.  Patient appears to have symptomatic V. fib/V. tach as he felt queasy, lightheaded and then his defibrillator fired.  He states that it felt like it lasted 3 to 4 minutes but he states that he was only shocked once.  He has felt much better after that happen.  Denies any further chest pain.  Patient has [employer]tr[employer] defibrillator which showed that he had 4 minutes of V. fib that was treated 4 times.  It appears that it was treated 3 times with overdrive pacing until delivered a shock.  Cardiology has been consulted and has come down to the emergency department to evaluate the patient.  They will admit the patient for further care.  Lab work showed no significant anemia, electrolyte abnormality, kidney injury.  Mildly elevated troponin.  Per cardiology they will admit for further care.  This chart was dictated using voice recognition software.  Despite best efforts to proofread,  errors can occur which can change the documentation meaning.     EKG Interpretation  Date/Time:  Friday March 14 2019 19:08:30 EST Ventricular Rate:  60 PR Interval:    QRS Duration: 174 QT Interval:  480 QTC Calculation: 480 R Axis:   -71 Text Interpretation: Sinus rhythm RBBB and LAFB Anteroseptal infarct, old No significant change since last tracing Confirmed by Lennice Sites [phone]) on 03/14/2019 7:20:08 PM          Lennice Sites, DO 03/14/19 2028
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
## 2019-03-15 ENCOUNTER — Other Ambulatory Visit: Payer: Self-pay | Admitting: Cardiology

## 2019-03-15 DIAGNOSIS — I472 Ventricular tachycardia, unspecified: Secondary | ICD-10-CM

## 2019-03-15 DIAGNOSIS — I2510 Atherosclerotic heart disease of native coronary artery without angina pectoris: Secondary | ICD-10-CM | POA: Diagnosis not present

## 2019-03-15 LAB — BASIC METABOLIC PANEL
Anion gap: 8 (ref 5–15)
BUN: 13 mg/dL (ref 8–23)
CO2: 25 mmol/L (ref 22–32)
Calcium: 8.6 mg/dL — ABNORMAL LOW (ref 8.9–10.3)
Chloride: 106 mmol/L (ref 98–111)
Creatinine, Ser: 0.87 mg/dL (ref 0.61–1.24)
GFR calc Af Amer: >60 mL/min (ref >60)
GFR calc non Af Amer: >60 mL/min (ref >60)
Glucose, Bld: 139 mg/dL — ABNORMAL HIGH (ref 70–99)
Potassium: 3.7 mmol/L (ref 3.5–5.1)
Sodium: 139 mmol/L (ref 135–145)

## 2019-03-15 LAB — CBC
HCT: 43.9 % (ref 39.0–52.0)
Hemoglobin: 15 g/dL (ref 13.0–17.0)
MCH: 32.5 pg (ref 26.0–34.0)
MCHC: 34.2 g/dL (ref 30.0–36.0)
MCV: 95 fL (ref 80.0–100.0)
Platelets: 143 10*3/uL — ABNORMAL LOW (ref 150–400)
RBC: 4.62 MIL/uL (ref 4.22–5.81)
RDW: 12.5 % (ref 11.5–15.5)
WBC: 8.1 10*3/uL (ref 4.0–10.5)
nRBC: 0 % (ref 0.0–0.2)

## 2019-03-15 LAB — SARS CORONAVIRUS 2 (TAT 6-24 HRS): SARS Coronavirus 2: NEGATIVE

## 2019-03-15 LAB — TROPONIN I (HIGH SENSITIVITY): Troponin I (High Sensitivity): 268 ng/L (ref <18)

## 2019-03-15 MED ORDER — SODIUM CHLORIDE 0.9% FLUSH: 3.0000 mL | INTRAVENOUS | Status: DC | PRN

## 2019-03-15 MED ORDER — ACETAMINOPHEN 325 MG PO TABS: 650.0000 mg | ORAL_TABLET | ORAL | Status: DC | PRN

## 2019-03-15 MED ORDER — SODIUM CHLORIDE 0.9% FLUSH
3.0000 mL | Freq: Two times a day (BID) | INTRAVENOUS | Status: DC
  Administered 2019-03-15 (×2): 3 mL via INTRAVENOUS

## 2019-03-15 MED ORDER — SODIUM CHLORIDE 0.9 % IV SOLN: 250.0000 mL | INTRAVENOUS | Status: DC | PRN

## 2019-03-15 MED ORDER — PANCRELIPASE (LIP-PROT-AMYL) 12000-38000 UNITS PO CPEP: 36000.0000 [IU] | ORAL_CAPSULE | ORAL | Status: DC | PRN

## 2019-03-15 MED ORDER — PANCRELIPASE (LIP-PROT-AMYL) 12000-38000 UNITS PO CPEP: 36000.0000 [IU] | ORAL_CAPSULE | Freq: Two times a day (BID) | ORAL | Status: DC | PRN

## 2019-03-15 MED ORDER — CARVEDILOL 3.125 MG PO TABS: 3.1250 mg | ORAL_TABLET | Freq: Two times a day (BID) | ORAL | 0 refills | Status: DC

## 2019-03-15 MED ORDER — ASPIRIN 81 MG PO CHEW
81.0000 mg | CHEWABLE_TABLET | Freq: Every morning | ORAL | Status: DC
  Administered 2019-03-15: 09:00:00 81 mg via ORAL
  Filled 2019-03-15: qty 1

## 2019-03-15 MED ORDER — PANCRELIPASE (LIP-PROT-AMYL) 12000-38000 UNITS PO CPEP
72000.0000 [IU] | ORAL_CAPSULE | Freq: Three times a day (TID) | ORAL | Status: DC
  Administered 2019-03-15: 09:00:00 72000 [IU] via ORAL
  Filled 2019-03-15: qty 6

## 2019-03-15 MED ORDER — CARVEDILOL 3.125 MG PO TABS
3.1250 mg | ORAL_TABLET | Freq: Two times a day (BID) | ORAL | Status: DC
  Administered 2019-03-15: 09:00:00 3.125 mg via ORAL
  Filled 2019-03-15: qty 1

## 2019-03-15 MED ORDER — ONDANSETRON HCL 4 MG/2ML IJ SOLN: 4.0000 mg | Freq: Four times a day (QID) | INTRAMUSCULAR | Status: DC | PRN

## 2019-03-15 MED ORDER — PANCRELIPASE (LIP-PROT-AMYL) 12000-38000 UNITS PO CPEP: 36000.0000 [IU] | ORAL_CAPSULE | ORAL | Status: DC

## 2019-03-15 MED ORDER — LOSARTAN POTASSIUM 25 MG PO TABS
25.0000 mg | ORAL_TABLET | Freq: Every day | ORAL | Status: DC
  Administered 2019-03-15: 09:00:00 25 mg via ORAL
  Filled 2019-03-15: qty 1

## 2019-03-15 MED ORDER — ATORVASTATIN CALCIUM 80 MG PO TABS
80.0000 mg | ORAL_TABLET | Freq: Every evening | ORAL | Status: DC
  Administered 2019-03-15: 01:00:00 80 mg via ORAL
  Filled 2019-03-15: qty 1

## 2019-03-15 MED ORDER — NITROGLYCERIN 0.4 MG SL SUBL: 0.4000 mg | SUBLINGUAL_TABLET | SUBLINGUAL | Status: DC | PRN

## 2019-03-15 MED ORDER — HEPARIN SODIUM (PORCINE) 5000 UNIT/ML IJ SOLN
5000.0000 [IU] | Freq: Three times a day (TID) | INTRAMUSCULAR | Status: DC
  Administered 2019-03-15 (×2): 5000 [IU] via SUBCUTANEOUS
  Filled 2019-03-15 (×2): qty 1
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
## 2019-03-15 NOTE — Plan of Care (Signed)
  Problem: Clinical Measurements: Goal: Ability to maintain clinical measurements within normal limits will improve Outcome: Progressing   Problem: Clinical Measurements: Goal: Cardiovascular complication will be avoided Outcome: Progressing   Problem: Pain Managment: Goal: General experience of comfort will improve Outcome: Progressing   

## 2019-03-15 NOTE — Care Management Obs Status (Signed)
[place] NOTIFICATION   Patient Details  Name: [name] MRN: [id number] Date of Birth: 1945/02/14   Medicare Observation Status Notification Given:  Yes    Claudie Leach, RN 03/15/2019, 1:01 PM

## 2019-03-15 NOTE — Care Management CC44 (Signed)
Condition Code 44 Documentation Completed  Patient Details  Name: KLEVER AIRD MRN: [id number] Date of Birth: 30-Mar-1945   Condition Code 44 given:  Yes Patient signature on Condition Code 44 notice:  Yes Documentation of 2 MD's agreement:  Yes Code 44 added to claim:  Yes    Claudie Leach, RN 03/15/2019, 1:01 PM

## 2019-03-23 DIAGNOSIS — I2510 Atherosclerotic heart disease of native coronary artery without angina pectoris: Secondary | ICD-10-CM | POA: Diagnosis not present

## 2019-03-23 DIAGNOSIS — I10 Essential (primary) hypertension: Secondary | ICD-10-CM | POA: Diagnosis not present

## 2019-03-23 DIAGNOSIS — K8681 Exocrine pancreatic insufficiency: Secondary | ICD-10-CM | POA: Diagnosis not present

## 2019-03-23 DIAGNOSIS — K861 Other chronic pancreatitis: Secondary | ICD-10-CM | POA: Diagnosis not present

## 2019-03-25 ENCOUNTER — Telehealth: Payer: Self-pay | Admitting: Internal Medicine

## 2019-03-25 NOTE — Telephone Encounter (Signed)
Per phone call from pt's daughter-- they're wanting to know if it's ok for pt to drive since he's been d/c from his previous hospital stay  Please call [phone]

## 2019-03-25 NOTE — Telephone Encounter (Signed)
Pt was hospitlaized following ICD treated VT/VF events on 03/14/19.  Advised pt daughter of  DMV laws that pt cannot drive for 6 months following treated event.

## 2019-03-31 ENCOUNTER — Other Ambulatory Visit: Payer: Self-pay

## 2019-03-31 ENCOUNTER — Ambulatory Visit (HOSPITAL_COMMUNITY): Payer: Medicare Other | Attending: Cardiology

## 2019-03-31 DIAGNOSIS — I472 Ventricular tachycardia, unspecified: Secondary | ICD-10-CM

## 2019-04-11 ENCOUNTER — Ambulatory Visit (INDEPENDENT_AMBULATORY_CARE_PROVIDER_SITE_OTHER): Payer: Medicare Other | Admitting: Internal Medicine

## 2019-04-11 ENCOUNTER — Encounter: Payer: Self-pay | Admitting: Internal Medicine

## 2019-04-11 ENCOUNTER — Other Ambulatory Visit: Payer: Self-pay

## 2019-04-11 VITALS — BP 130/74 | HR 58 | Temp 94.6°F | Ht 70.5 in | Wt 202.8 lb

## 2019-04-11 DIAGNOSIS — I255 Ischemic cardiomyopathy: Secondary | ICD-10-CM | POA: Diagnosis not present

## 2019-04-11 DIAGNOSIS — I5042 Chronic combined systolic (congestive) and diastolic (congestive) heart failure: Secondary | ICD-10-CM

## 2019-04-11 DIAGNOSIS — I472 Ventricular tachycardia, unspecified: Secondary | ICD-10-CM

## 2019-04-11 LAB — CUP PACEART INCLINIC DEVICE CHECK
Battery Remaining Longevity: 92 mo
Battery Voltage: 2.98 V
Brady Statistic RV Percent Paced: 2.08 %
Date Time Interrogation Session: 20210319103445
HighPow Impedance: 50 Ohm
HighPow Impedance: 71 Ohm
Implantable Lead Implant Date: 20080624
Implantable Lead Location: 753860
Implantable Lead Model: 6947
Implantable Pulse Generator Implant Date: 20170714
Lead Channel Impedance Value: 475 Ohm
Lead Channel Impedance Value: 551 Ohm
Lead Channel Pacing Threshold Amplitude: 0.5 V
Lead Channel Pacing Threshold Pulse Width: 0.4 ms
Lead Channel Sensing Intrinsic Amplitude: 8.125 mV
Lead Channel Setting Pacing Amplitude: 2.5 V
Lead Channel Setting Pacing Pulse Width: 0.4 ms
Lead Channel Setting Sensing Sensitivity: 0.9 mV

## 2019-04-11 MED ORDER — ENTRESTO 24-26 MG PO TABS: 1.0000 | ORAL_TABLET | Freq: Two times a day (BID) | ORAL | 11 refills | Status: DC

## 2019-04-11 NOTE — Patient Instructions (Signed)
Medication Instructions:  Your physician has recommended you make the following change in your medication:  Stop taking Lisinopril  Start Entresto 24-26 mg Two Times Daily on Sunday    *If you need a refill on your cardiac medications before your next appointment, please call your pharmacy*   Lab Work: NONE  If you have labs (blood work) drawn today and your tests are completely normal, you will receive your results only by: Marland Kitchen MyChart Message (if you have MyChart) OR . A paper copy in the mail If you have any lab test that is abnormal or we need to change your treatment, we will call you to review the results.   Testing/Procedures: NONE    Follow-Up: At [facility], you and your health needs are our priority.  As part of our continuing mission to provide you with exceptional heart care, we have created designated Provider Care Teams.  These Care Teams include your primary Cardiologist (physician) and Advanced Practice Providers (APPs -  Physician Assistants and Nurse Practitioners) who all work together to provide you with the care you need, when you need it.  We recommend signing up for the patient portal called "MyChart".  Sign up information is provided on this After Visit Summary.  MyChart is used to connect with patients for Virtual Visits (Telemedicine).  Patients are able to view lab/test results, encounter notes, upcoming appointments, etc.  Non-urgent messages can be sent to your provider as well.   To learn more about what you can do with MyChart, go to [email or web address].    Your next appointment:   1 year(s)  The format for your next appointment:   In Person  Provider:   Cristopher Peru, MD   Other Instructions Thank you for choosing [facility]!
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
## 2019-04-23 DIAGNOSIS — K8681 Exocrine pancreatic insufficiency: Secondary | ICD-10-CM | POA: Diagnosis not present

## 2019-04-23 DIAGNOSIS — Z87891 Personal history of nicotine dependence: Secondary | ICD-10-CM | POA: Diagnosis not present

## 2019-04-23 DIAGNOSIS — I2510 Atherosclerotic heart disease of native coronary artery without angina pectoris: Secondary | ICD-10-CM | POA: Diagnosis not present

## 2019-04-23 DIAGNOSIS — K861 Other chronic pancreatitis: Secondary | ICD-10-CM | POA: Diagnosis not present

## 2019-05-20 ENCOUNTER — Ambulatory Visit (INDEPENDENT_AMBULATORY_CARE_PROVIDER_SITE_OTHER): Payer: Medicare Other | Admitting: *Deleted

## 2019-05-20 DIAGNOSIS — I5042 Chronic combined systolic (congestive) and diastolic (congestive) heart failure: Secondary | ICD-10-CM | POA: Diagnosis not present

## 2019-05-20 LAB — CUP PACEART REMOTE DEVICE CHECK
Battery Remaining Longevity: 94 mo
Battery Voltage: 3 V
Brady Statistic RV Percent Paced: 0.1 %
Date Time Interrogation Session: 20210427031705
HighPow Impedance: 44 Ohm
HighPow Impedance: 58 Ohm
Implantable Lead Implant Date: 20080624
Implantable Lead Location: 753860
Implantable Lead Model: 6947
Implantable Pulse Generator Implant Date: 20170714
Lead Channel Impedance Value: 418 Ohm
Lead Channel Impedance Value: 475 Ohm
Lead Channel Pacing Threshold Amplitude: 0.375 V
Lead Channel Pacing Threshold Pulse Width: 0.4 ms
Lead Channel Sensing Intrinsic Amplitude: 7.5 mV
Lead Channel Sensing Intrinsic Amplitude: 7.5 mV
Lead Channel Setting Pacing Amplitude: 2.5 V
Lead Channel Setting Pacing Pulse Width: 0.4 ms
Lead Channel Setting Sensing Sensitivity: 0.9 mV

## 2019-05-21 DIAGNOSIS — Z23 Encounter for immunization: Secondary | ICD-10-CM | POA: Diagnosis not present

## 2019-05-21 NOTE — Progress Notes (Signed)
ICD Remote  

## 2019-05-23 DIAGNOSIS — K861 Other chronic pancreatitis: Secondary | ICD-10-CM | POA: Diagnosis not present

## 2019-05-23 DIAGNOSIS — I10 Essential (primary) hypertension: Secondary | ICD-10-CM | POA: Diagnosis not present

## 2019-05-23 DIAGNOSIS — K8681 Exocrine pancreatic insufficiency: Secondary | ICD-10-CM | POA: Diagnosis not present

## 2019-05-23 DIAGNOSIS — I2510 Atherosclerotic heart disease of native coronary artery without angina pectoris: Secondary | ICD-10-CM | POA: Diagnosis not present

## 2019-06-10 ENCOUNTER — Other Ambulatory Visit: Payer: Self-pay

## 2019-06-10 MED ORDER — CARVEDILOL 3.125 MG PO TABS: 3.1250 mg | ORAL_TABLET | Freq: Two times a day (BID) | ORAL | 3 refills | Status: DC

## 2019-06-10 NOTE — Telephone Encounter (Signed)
Refilled Coreg for patient

## 2019-06-10 NOTE — Telephone Encounter (Signed)
Pt asking for a return call. Pt is requesting Dr.Taylor to refill a medication from another Physician he is unable to get in touch with.  Please call [phone]   Thanks renee

## 2019-06-18 DIAGNOSIS — Z23 Encounter for immunization: Secondary | ICD-10-CM | POA: Diagnosis not present

## 2019-06-23 DIAGNOSIS — I10 Essential (primary) hypertension: Secondary | ICD-10-CM | POA: Diagnosis not present

## 2019-06-23 DIAGNOSIS — K861 Other chronic pancreatitis: Secondary | ICD-10-CM | POA: Diagnosis not present

## 2019-06-23 DIAGNOSIS — K8681 Exocrine pancreatic insufficiency: Secondary | ICD-10-CM | POA: Diagnosis not present

## 2019-06-23 DIAGNOSIS — I2510 Atherosclerotic heart disease of native coronary artery without angina pectoris: Secondary | ICD-10-CM | POA: Diagnosis not present

## 2019-06-24 ENCOUNTER — Telehealth: Payer: Self-pay | Admitting: Internal Medicine

## 2019-06-24 NOTE — Telephone Encounter (Signed)
Holly from [facility] called. There was a form faxed over to Dr. Lovena Le in regards to the patient's [facility] from [place]. She was just calling to make sure the form was received from the pharmacy.   The first  form had the wrong date of birth for the patient. The form states the DOB is 11/30/1947 not [birth year]. The agent from [place] will be sending a second form with an updated DOB   Please let [facility] from [place] know when the form has been received and if there is anything else she may need to do.

## 2019-06-24 NOTE — Telephone Encounter (Signed)
[place]. There is no fax from [place] in Dr. Tanna Furry box. Advised her I would forward to his RN to review later in the week since the fax may not have been delivered yet.

## 2019-06-26 NOTE — Telephone Encounter (Signed)
Fax received with Entresto PAF.  Paperwork signed by Dr. Lovena Le and scanned to [place].

## 2019-06-27 NOTE — Telephone Encounter (Signed)
Application faxed as requested °

## 2019-06-27 NOTE — Telephone Encounter (Signed)
**Note De-Identified Leibish Mcgregor Obfuscation** I have completed the provider page of the pts [employer] pt asst application for Entresto that was faxed to the office from Astrid Drafts, [facility] with [employer]. Per Urban Gibson all of the pts information has already been faxed to [employer] and that all they need now is the provider page.   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

## 2019-07-23 DIAGNOSIS — I2510 Atherosclerotic heart disease of native coronary artery without angina pectoris: Secondary | ICD-10-CM | POA: Diagnosis not present

## 2019-07-23 DIAGNOSIS — K861 Other chronic pancreatitis: Secondary | ICD-10-CM | POA: Diagnosis not present

## 2019-07-23 DIAGNOSIS — I10 Essential (primary) hypertension: Secondary | ICD-10-CM | POA: Diagnosis not present

## 2019-07-23 DIAGNOSIS — K8681 Exocrine pancreatic insufficiency: Secondary | ICD-10-CM | POA: Diagnosis not present

## 2019-07-29 ENCOUNTER — Telehealth: Payer: Self-pay | Admitting: Internal Medicine

## 2019-07-29 NOTE — Telephone Encounter (Signed)
Returned call to pt.  Advised Pt may not drive again until September 11, 2019.  He may begin driving at that time as long as he does not have any further treatments from his device.  Pt indicates understanding.

## 2019-07-29 NOTE — Telephone Encounter (Signed)
Patient calling requesting to know if he can drive today to work. He states he needs to know by 1:15 pm.

## 2019-08-19 ENCOUNTER — Ambulatory Visit (INDEPENDENT_AMBULATORY_CARE_PROVIDER_SITE_OTHER): Payer: Medicare Other | Admitting: *Deleted

## 2019-08-19 DIAGNOSIS — I472 Ventricular tachycardia, unspecified: Secondary | ICD-10-CM

## 2019-08-20 LAB — CUP PACEART REMOTE DEVICE CHECK
Battery Remaining Longevity: 95 mo
Battery Voltage: 3 V
Brady Statistic RV Percent Paced: 0.13 %
Date Time Interrogation Session: 20210727031804
HighPow Impedance: 44 Ohm
HighPow Impedance: 58 Ohm
Implantable Lead Implant Date: 20080624
Implantable Lead Location: 753860
Implantable Lead Model: 6947
Implantable Pulse Generator Implant Date: 20170714
Lead Channel Impedance Value: 456 Ohm
Lead Channel Impedance Value: 513 Ohm
Lead Channel Pacing Threshold Amplitude: 0.375 V
Lead Channel Pacing Threshold Pulse Width: 0.4 ms
Lead Channel Sensing Intrinsic Amplitude: 7.5 mV
Lead Channel Sensing Intrinsic Amplitude: 7.5 mV
Lead Channel Setting Pacing Amplitude: 2.5 V
Lead Channel Setting Pacing Pulse Width: 0.4 ms
Lead Channel Setting Sensing Sensitivity: 0.9 mV

## 2019-08-22 DIAGNOSIS — K861 Other chronic pancreatitis: Secondary | ICD-10-CM | POA: Diagnosis not present

## 2019-08-22 DIAGNOSIS — I10 Essential (primary) hypertension: Secondary | ICD-10-CM | POA: Diagnosis not present

## 2019-08-22 DIAGNOSIS — I2510 Atherosclerotic heart disease of native coronary artery without angina pectoris: Secondary | ICD-10-CM | POA: Diagnosis not present

## 2019-08-22 DIAGNOSIS — K8681 Exocrine pancreatic insufficiency: Secondary | ICD-10-CM | POA: Diagnosis not present

## 2019-08-22 NOTE — Progress Notes (Signed)
Remote ICD transmission.   

## 2019-09-15 ENCOUNTER — Encounter: Payer: Self-pay | Admitting: Cardiology

## 2019-09-15 DIAGNOSIS — I714 Abdominal aortic aneurysm, without rupture: Secondary | ICD-10-CM | POA: Diagnosis not present

## 2019-09-15 DIAGNOSIS — J449 Chronic obstructive pulmonary disease, unspecified: Secondary | ICD-10-CM | POA: Diagnosis not present

## 2019-09-15 DIAGNOSIS — Z6841 Body Mass Index (BMI) 40.0 and over, adult: Secondary | ICD-10-CM | POA: Diagnosis not present

## 2019-09-15 DIAGNOSIS — Z1389 Encounter for screening for other disorder: Secondary | ICD-10-CM | POA: Diagnosis not present

## 2019-09-15 DIAGNOSIS — I719 Aortic aneurysm of unspecified site, without rupture: Secondary | ICD-10-CM | POA: Diagnosis not present

## 2019-09-15 DIAGNOSIS — Z0000 Encounter for general adult medical examination without abnormal findings: Secondary | ICD-10-CM | POA: Diagnosis not present

## 2019-09-15 DIAGNOSIS — E7849 Other hyperlipidemia: Secondary | ICD-10-CM | POA: Diagnosis not present

## 2019-09-15 DIAGNOSIS — E669 Obesity, unspecified: Secondary | ICD-10-CM | POA: Diagnosis not present

## 2019-09-15 DIAGNOSIS — K858 Other acute pancreatitis without necrosis or infection: Secondary | ICD-10-CM | POA: Diagnosis not present

## 2019-09-15 DIAGNOSIS — K861 Other chronic pancreatitis: Secondary | ICD-10-CM | POA: Diagnosis not present

## 2019-09-15 DIAGNOSIS — I10 Essential (primary) hypertension: Secondary | ICD-10-CM | POA: Diagnosis not present

## 2019-09-23 DIAGNOSIS — I2510 Atherosclerotic heart disease of native coronary artery without angina pectoris: Secondary | ICD-10-CM | POA: Diagnosis not present

## 2019-09-23 DIAGNOSIS — I110 Hypertensive heart disease with heart failure: Secondary | ICD-10-CM | POA: Diagnosis not present

## 2019-09-23 DIAGNOSIS — I714 Abdominal aortic aneurysm, without rupture: Secondary | ICD-10-CM | POA: Diagnosis not present

## 2019-09-23 DIAGNOSIS — I5022 Chronic systolic (congestive) heart failure: Secondary | ICD-10-CM | POA: Diagnosis not present

## 2019-09-24 ENCOUNTER — Other Ambulatory Visit: Payer: Self-pay | Admitting: Cardiology

## 2019-09-24 MED ORDER — ATORVASTATIN CALCIUM 80 MG PO TABS
80.0000 mg | ORAL_TABLET | Freq: Every evening | ORAL | 1 refills | Status: DC
Start: 2019-09-24 — End: 2020-03-22

## 2019-09-24 NOTE — Telephone Encounter (Signed)
[facility]

## 2019-09-24 NOTE — Telephone Encounter (Signed)
Pt would like to change refills to [facility] Please call in atorvastatin #[phone]   Call Pt if needed  [phone]    thanks

## 2019-10-23 DIAGNOSIS — I10 Essential (primary) hypertension: Secondary | ICD-10-CM | POA: Diagnosis not present

## 2019-10-23 DIAGNOSIS — K8681 Exocrine pancreatic insufficiency: Secondary | ICD-10-CM | POA: Diagnosis not present

## 2019-10-23 DIAGNOSIS — K861 Other chronic pancreatitis: Secondary | ICD-10-CM | POA: Diagnosis not present

## 2019-10-23 DIAGNOSIS — Z87891 Personal history of nicotine dependence: Secondary | ICD-10-CM | POA: Diagnosis not present

## 2019-11-10 DIAGNOSIS — M7731 Calcaneal spur, right foot: Secondary | ICD-10-CM | POA: Diagnosis not present

## 2019-11-10 DIAGNOSIS — M79671 Pain in right foot: Secondary | ICD-10-CM | POA: Diagnosis not present

## 2019-11-10 DIAGNOSIS — M71571 Other bursitis, not elsewhere classified, right ankle and foot: Secondary | ICD-10-CM | POA: Diagnosis not present

## 2019-11-10 DIAGNOSIS — M25571 Pain in right ankle and joints of right foot: Secondary | ICD-10-CM | POA: Diagnosis not present

## 2019-11-10 DIAGNOSIS — M722 Plantar fascial fibromatosis: Secondary | ICD-10-CM | POA: Diagnosis not present

## 2019-11-18 ENCOUNTER — Ambulatory Visit (INDEPENDENT_AMBULATORY_CARE_PROVIDER_SITE_OTHER): Payer: Medicare Other

## 2019-11-18 DIAGNOSIS — I255 Ischemic cardiomyopathy: Secondary | ICD-10-CM | POA: Diagnosis not present

## 2019-11-18 LAB — CUP PACEART REMOTE DEVICE CHECK
Battery Remaining Longevity: 90 mo
Battery Voltage: 2.99 V
Brady Statistic RV Percent Paced: 0.32 %
Date Time Interrogation Session: 20211026031806
HighPow Impedance: 46 Ohm
HighPow Impedance: 58 Ohm
Implantable Lead Implant Date: 20080624
Implantable Lead Location: 753860
Implantable Lead Model: 6947
Implantable Pulse Generator Implant Date: 20170714
Lead Channel Impedance Value: 456 Ohm
Lead Channel Impedance Value: 532 Ohm
Lead Channel Pacing Threshold Amplitude: 0.375 V
Lead Channel Pacing Threshold Pulse Width: 0.4 ms
Lead Channel Sensing Intrinsic Amplitude: 8.5 mV
Lead Channel Sensing Intrinsic Amplitude: 8.5 mV
Lead Channel Setting Pacing Amplitude: 2.5 V
Lead Channel Setting Pacing Pulse Width: 0.4 ms
Lead Channel Setting Sensing Sensitivity: 0.9 mV

## 2019-11-22 DIAGNOSIS — E7849 Other hyperlipidemia: Secondary | ICD-10-CM | POA: Diagnosis not present

## 2019-11-22 DIAGNOSIS — I2510 Atherosclerotic heart disease of native coronary artery without angina pectoris: Secondary | ICD-10-CM | POA: Diagnosis not present

## 2019-11-22 DIAGNOSIS — I10 Essential (primary) hypertension: Secondary | ICD-10-CM | POA: Diagnosis not present

## 2019-11-22 DIAGNOSIS — K8681 Exocrine pancreatic insufficiency: Secondary | ICD-10-CM | POA: Diagnosis not present

## 2019-11-24 NOTE — Progress Notes (Signed)
Remote ICD transmission.   

## 2019-12-03 DIAGNOSIS — M71571 Other bursitis, not elsewhere classified, right ankle and foot: Secondary | ICD-10-CM | POA: Diagnosis not present

## 2019-12-03 DIAGNOSIS — M722 Plantar fascial fibromatosis: Secondary | ICD-10-CM | POA: Diagnosis not present

## 2019-12-09 DIAGNOSIS — Z23 Encounter for immunization: Secondary | ICD-10-CM | POA: Diagnosis not present

## 2019-12-31 DIAGNOSIS — L600 Ingrowing nail: Secondary | ICD-10-CM | POA: Diagnosis not present

## 2020-01-23 DIAGNOSIS — K8681 Exocrine pancreatic insufficiency: Secondary | ICD-10-CM | POA: Diagnosis not present

## 2020-01-23 DIAGNOSIS — I2510 Atherosclerotic heart disease of native coronary artery without angina pectoris: Secondary | ICD-10-CM | POA: Diagnosis not present

## 2020-01-23 DIAGNOSIS — K861 Other chronic pancreatitis: Secondary | ICD-10-CM | POA: Diagnosis not present

## 2020-01-23 DIAGNOSIS — I10 Essential (primary) hypertension: Secondary | ICD-10-CM | POA: Diagnosis not present

## 2020-02-17 ENCOUNTER — Ambulatory Visit: Payer: Medicare Other

## 2020-02-17 LAB — CUP PACEART REMOTE DEVICE CHECK
Battery Remaining Longevity: 85 mo
Battery Voltage: 3 V
Brady Statistic RV Percent Paced: 0.37 %
Date Time Interrogation Session: 20220125033625
HighPow Impedance: 45 Ohm
HighPow Impedance: 59 Ohm
Implantable Lead Implant Date: 20080624
Implantable Lead Location: 753860
Implantable Lead Model: 6947
Implantable Pulse Generator Implant Date: 20170714
Lead Channel Impedance Value: 418 Ohm
Lead Channel Impedance Value: 475 Ohm
Lead Channel Pacing Threshold Amplitude: 0.375 V
Lead Channel Pacing Threshold Pulse Width: 0.4 ms
Lead Channel Sensing Intrinsic Amplitude: 9.75 mV
Lead Channel Sensing Intrinsic Amplitude: 9.75 mV
Lead Channel Setting Pacing Amplitude: 2.5 V
Lead Channel Setting Pacing Pulse Width: 0.4 ms
Lead Channel Setting Sensing Sensitivity: 0.9 mV

## 2020-02-21 DIAGNOSIS — Z87891 Personal history of nicotine dependence: Secondary | ICD-10-CM | POA: Diagnosis not present

## 2020-02-21 DIAGNOSIS — I2510 Atherosclerotic heart disease of native coronary artery without angina pectoris: Secondary | ICD-10-CM | POA: Diagnosis not present

## 2020-02-21 DIAGNOSIS — I10 Essential (primary) hypertension: Secondary | ICD-10-CM | POA: Diagnosis not present

## 2020-02-21 DIAGNOSIS — K8681 Exocrine pancreatic insufficiency: Secondary | ICD-10-CM | POA: Diagnosis not present

## 2020-03-08 ENCOUNTER — Ambulatory Visit: Payer: Medicare Other | Admitting: Physician Assistant

## 2020-03-22 ENCOUNTER — Other Ambulatory Visit: Payer: Self-pay | Admitting: Cardiology

## 2020-04-08 DIAGNOSIS — Z681 Body mass index (BMI) 19 or less, adult: Secondary | ICD-10-CM | POA: Diagnosis not present

## 2020-04-08 DIAGNOSIS — J449 Chronic obstructive pulmonary disease, unspecified: Secondary | ICD-10-CM | POA: Diagnosis not present

## 2020-04-08 DIAGNOSIS — J22 Unspecified acute lower respiratory infection: Secondary | ICD-10-CM | POA: Diagnosis not present

## 2020-04-20 ENCOUNTER — Encounter: Payer: Medicare Other | Admitting: Internal Medicine

## 2020-04-21 DIAGNOSIS — I2510 Atherosclerotic heart disease of native coronary artery without angina pectoris: Secondary | ICD-10-CM | POA: Diagnosis not present

## 2020-04-21 DIAGNOSIS — U071 COVID-19: Secondary | ICD-10-CM | POA: Diagnosis not present

## 2020-04-21 DIAGNOSIS — E7849 Other hyperlipidemia: Secondary | ICD-10-CM | POA: Diagnosis not present

## 2020-04-21 DIAGNOSIS — I10 Essential (primary) hypertension: Secondary | ICD-10-CM | POA: Diagnosis not present

## 2020-05-04 DIAGNOSIS — F1011 Alcohol abuse, in remission: Secondary | ICD-10-CM | POA: Diagnosis not present

## 2020-05-04 DIAGNOSIS — Z79899 Other long term (current) drug therapy: Secondary | ICD-10-CM | POA: Diagnosis not present

## 2020-05-04 DIAGNOSIS — K860 Alcohol-induced chronic pancreatitis: Secondary | ICD-10-CM | POA: Diagnosis not present

## 2020-05-04 DIAGNOSIS — Z87891 Personal history of nicotine dependence: Secondary | ICD-10-CM | POA: Diagnosis not present

## 2020-05-04 DIAGNOSIS — K861 Other chronic pancreatitis: Secondary | ICD-10-CM | POA: Diagnosis not present

## 2020-05-05 ENCOUNTER — Encounter: Payer: Self-pay | Admitting: Cardiology

## 2020-05-05 ENCOUNTER — Other Ambulatory Visit: Payer: Self-pay

## 2020-05-05 ENCOUNTER — Encounter: Payer: Self-pay | Admitting: *Deleted

## 2020-05-05 ENCOUNTER — Ambulatory Visit (INDEPENDENT_AMBULATORY_CARE_PROVIDER_SITE_OTHER): Payer: Medicare Other | Admitting: Cardiology

## 2020-05-05 VITALS — BP 126/82 | HR 44 | Ht 71.0 in | Wt 210.0 lb

## 2020-05-05 DIAGNOSIS — I2510 Atherosclerotic heart disease of native coronary artery without angina pectoris: Secondary | ICD-10-CM

## 2020-05-05 DIAGNOSIS — E782 Mixed hyperlipidemia: Secondary | ICD-10-CM | POA: Diagnosis not present

## 2020-05-05 DIAGNOSIS — I5022 Chronic systolic (congestive) heart failure: Secondary | ICD-10-CM

## 2020-05-05 DIAGNOSIS — I10 Essential (primary) hypertension: Secondary | ICD-10-CM
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
## 2020-05-05 NOTE — Patient Instructions (Signed)
Medication Instructions:  Continue all current medications.   Labwork: none  Testing/Procedures: none  Follow-Up: 6 months   Any Other Special Instructions Will Be Listed Below (If Applicable).   If you need a refill on your cardiac medications before your next appointment, please call your pharmacy.  

## 2020-05-11 ENCOUNTER — Other Ambulatory Visit: Payer: Self-pay

## 2020-05-11 ENCOUNTER — Ambulatory Visit (INDEPENDENT_AMBULATORY_CARE_PROVIDER_SITE_OTHER): Payer: Medicare Other | Admitting: Internal Medicine

## 2020-05-11 VITALS — BP 108/72 | HR 58

## 2020-05-11 DIAGNOSIS — I5022 Chronic systolic (congestive) heart failure: Secondary | ICD-10-CM | POA: Diagnosis not present

## 2020-05-11 DIAGNOSIS — I2510 Atherosclerotic heart disease of native coronary artery without angina pectoris: Secondary | ICD-10-CM | POA: Diagnosis not present

## 2020-05-11 MED ORDER — NITROGLYCERIN 0.4 MG SL SUBL: 0.4000 mg | SUBLINGUAL_TABLET | SUBLINGUAL | 4 refills | Status: DC | PRN

## 2020-05-11 NOTE — Patient Instructions (Signed)
Medication Instructions:  Your physician recommends that you continue on your current medications as directed. Please refer to the Current Medication list given to you today.   I refilled your nitroglycerin.   *If you need a refill on your cardiac medications before your next appointment, please call your pharmacy*   Lab Work: None today  If you have labs (blood work) drawn today and your tests are completely normal, you will receive your results only by: Marland Kitchen MyChart Message (if you have MyChart) OR . A paper copy in the mail If you have any lab test that is abnormal or we need to change your treatment, we will call you to review the results.   Testing/Procedures: None today   Follow-Up: At [facility], you and your health needs are our priority.  As part of our continuing mission to provide you with exceptional heart care, we have created designated Provider Care Teams.  These Care Teams include your primary Cardiologist (physician) and Advanced Practice Providers (APPs -  Physician Assistants and Nurse Practitioners) who all work together to provide you with the care you need, when you need it.  We recommend signing up for the patient portal called "MyChart".  Sign up information is provided on this After Visit Summary.  MyChart is used to connect with patients for Virtual Visits (Telemedicine).  Patients are able to view lab/test results, encounter notes, upcoming appointments, etc.  Non-urgent messages can be sent to your provider as well.   To learn more about what you can do with MyChart, go to [email or web address].    Your next appointment:   12 month(s)  The format for your next appointment:   In Person  Provider:   Cristopher Peru, MD   Other Instructions None
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
## 2020-05-18 ENCOUNTER — Ambulatory Visit (INDEPENDENT_AMBULATORY_CARE_PROVIDER_SITE_OTHER): Payer: Medicare Other

## 2020-05-18 DIAGNOSIS — I255 Ischemic cardiomyopathy: Secondary | ICD-10-CM

## 2020-05-18 LAB — CUP PACEART REMOTE DEVICE CHECK
Battery Remaining Longevity: 81 mo
Battery Voltage: 2.99 V
Brady Statistic RV Percent Paced: 0.11 %
Date Time Interrogation Session: 20220426032206
HighPow Impedance: 44 Ohm
HighPow Impedance: 55 Ohm
Implantable Lead Implant Date: 20080624
Implantable Lead Location: 753860
Implantable Lead Model: 6947
Implantable Pulse Generator Implant Date: 20170714
Lead Channel Impedance Value: 456 Ohm
Lead Channel Impedance Value: 513 Ohm
Lead Channel Pacing Threshold Amplitude: 0.375 V
Lead Channel Pacing Threshold Pulse Width: 0.4 ms
Lead Channel Sensing Intrinsic Amplitude: 8.25 mV
Lead Channel Sensing Intrinsic Amplitude: 8.25 mV
Lead Channel Setting Pacing Amplitude: 2.5 V
Lead Channel Setting Pacing Pulse Width: 0.4 ms
Lead Channel Setting Sensing Sensitivity: 0.9 mV

## 2020-05-19 DIAGNOSIS — D1779 Benign lipomatous neoplasm of other sites: Secondary | ICD-10-CM | POA: Diagnosis not present

## 2020-05-19 DIAGNOSIS — K8689 Other specified diseases of pancreas: Secondary | ICD-10-CM | POA: Diagnosis not present

## 2020-05-19 DIAGNOSIS — K860 Alcohol-induced chronic pancreatitis: Secondary | ICD-10-CM | POA: Diagnosis not present

## 2020-05-20 ENCOUNTER — Telehealth: Payer: Self-pay | Admitting: Internal Medicine

## 2020-05-20 NOTE — Telephone Encounter (Signed)
Spoke with pt. [name] 36, 000 samples are ready for pickup.

## 2020-05-20 NOTE — Telephone Encounter (Signed)
Pt said he has been out of his creon for about a week and is waiting for a shipment to come in. He is asking if we have any samples so he can get back on the medication. Please advise. [phone]

## 2020-05-22 DIAGNOSIS — I2510 Atherosclerotic heart disease of native coronary artery without angina pectoris: Secondary | ICD-10-CM | POA: Diagnosis not present

## 2020-05-22 DIAGNOSIS — I10 Essential (primary) hypertension: Secondary | ICD-10-CM | POA: Diagnosis not present

## 2020-06-06 ENCOUNTER — Other Ambulatory Visit: Payer: Self-pay | Admitting: Internal Medicine

## 2020-06-08 NOTE — Progress Notes (Signed)
Remote ICD transmission.   

## 2020-07-22 DIAGNOSIS — E782 Mixed hyperlipidemia: Secondary | ICD-10-CM | POA: Diagnosis not present

## 2020-07-22 DIAGNOSIS — I2510 Atherosclerotic heart disease of native coronary artery without angina pectoris: Secondary | ICD-10-CM | POA: Diagnosis not present

## 2020-07-22 DIAGNOSIS — I10 Essential (primary) hypertension: Secondary | ICD-10-CM | POA: Diagnosis not present

## 2020-08-17 ENCOUNTER — Ambulatory Visit (INDEPENDENT_AMBULATORY_CARE_PROVIDER_SITE_OTHER): Payer: Medicare Other

## 2020-08-17 DIAGNOSIS — I255 Ischemic cardiomyopathy: Secondary | ICD-10-CM

## 2020-08-17 LAB — CUP PACEART REMOTE DEVICE CHECK
Battery Remaining Longevity: 76 mo
Battery Voltage: 2.99 V
Brady Statistic RV Percent Paced: 0.46 %
Date Time Interrogation Session: 20220726033626
HighPow Impedance: 44 Ohm
HighPow Impedance: 58 Ohm
Implantable Lead Implant Date: 20080624
Implantable Lead Location: 753860
Implantable Lead Model: 6947
Implantable Pulse Generator Implant Date: 20170714
Lead Channel Impedance Value: 456 Ohm
Lead Channel Impedance Value: 513 Ohm
Lead Channel Pacing Threshold Amplitude: 0.375 V
Lead Channel Pacing Threshold Pulse Width: 0.4 ms
Lead Channel Sensing Intrinsic Amplitude: 8.25 mV
Lead Channel Sensing Intrinsic Amplitude: 8.25 mV
Lead Channel Setting Pacing Amplitude: 2.5 V
Lead Channel Setting Pacing Pulse Width: 0.4 ms
Lead Channel Setting Sensing Sensitivity: 0.9 mV

## 2020-09-13 NOTE — Progress Notes (Signed)
Remote ICD transmission.   

## 2020-10-11 ENCOUNTER — Other Ambulatory Visit: Payer: Self-pay

## 2020-10-11 ENCOUNTER — Encounter: Payer: Self-pay | Admitting: Cardiology

## 2020-10-11 ENCOUNTER — Ambulatory Visit (INDEPENDENT_AMBULATORY_CARE_PROVIDER_SITE_OTHER): Payer: Medicare Other | Admitting: Cardiology

## 2020-10-11 VITALS — BP 138/64 | HR 56 | Ht 71.0 in | Wt 205.0 lb

## 2020-10-11 DIAGNOSIS — Z131 Encounter for screening for diabetes mellitus: Secondary | ICD-10-CM

## 2020-10-11 DIAGNOSIS — I2510 Atherosclerotic heart disease of native coronary artery without angina pectoris: Secondary | ICD-10-CM

## 2020-10-11 DIAGNOSIS — E782 Mixed hyperlipidemia: Secondary | ICD-10-CM | POA: Diagnosis not present

## 2020-10-11 DIAGNOSIS — I5022 Chronic systolic (congestive) heart failure: Secondary | ICD-10-CM | POA: Diagnosis not present

## 2020-10-11 DIAGNOSIS — I255 Ischemic cardiomyopathy: Secondary | ICD-10-CM

## 2020-10-11 MED ORDER — DAPAGLIFLOZIN PROPANEDIOL 10 MG PO TABS: 10.0000 mg | ORAL_TABLET | Freq: Every day | ORAL | 6 refills | Status: DC

## 2020-10-11 NOTE — Patient Instructions (Signed)
Medication Instructions:   START Farxiga 10 mg daily- 30 day free trial coupon given    *If you need a refill on your cardiac medications before your next appointment, please call your pharmacy*   Lab Work: Fasting lipids, cbc,cmet,tsh, magnesium  If you have labs (blood work) drawn today and your tests are completely normal, you will receive your results only by: [place] (if you have MyChart) OR A paper copy in the mail If you have any lab test that is abnormal or we need to change your treatment, we will call you to review the results.   Testing/Procedures: None today    Follow-Up: At [facility], you and your health needs are our priority.  As part of our continuing mission to provide you with exceptional heart care, we have created designated Provider Care Teams.  These Care Teams include your primary Cardiologist (physician) and Advanced Practice Providers (APPs -  Physician Assistants and Nurse Practitioners) who all work together to provide you with the care you need, when you need it.  We recommend signing up for the patient portal called "MyChart".  Sign up information is provided on this After Visit Summary.  MyChart is used to connect with patients for Virtual Visits (Telemedicine).  Patients are able to view lab/test results, encounter notes, upcoming appointments, etc.  Non-urgent messages can be sent to your provider as well.   To learn more about what you can do with MyChart, go to [email or web address].    Your next appointment:   6 month(s)  The format for your next appointment:   In Person  Provider:   Carlyle Dolly, MD   Other Instructions None
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
## 2020-10-13 ENCOUNTER — Other Ambulatory Visit (HOSPITAL_COMMUNITY)
Admission: RE | Admit: 2020-10-13 | Discharge: 2020-10-13 | Disposition: A | Discharge status: Home / Self Care | Payer: Medicare Other | Source: Ambulatory Visit | Attending: Cardiology | Admitting: Cardiology

## 2020-10-13 DIAGNOSIS — I2510 Atherosclerotic heart disease of native coronary artery without angina pectoris: Secondary | ICD-10-CM | POA: Diagnosis not present

## 2020-10-13 DIAGNOSIS — E782 Mixed hyperlipidemia: Secondary | ICD-10-CM | POA: Insufficient documentation

## 2020-10-13 DIAGNOSIS — I5022 Chronic systolic (congestive) heart failure: Secondary | ICD-10-CM | POA: Diagnosis not present

## 2020-10-13 LAB — TSH: TSH: 1.85 u[IU]/mL (ref 0.350–4.500)

## 2020-10-13 LAB — LIPID PANEL
Cholesterol: 73 mg/dL (ref 0–200)
HDL: 34 mg/dL — ABNORMAL LOW (ref >40)
LDL Cholesterol: 34 mg/dL (ref 0–99)
Total CHOL/HDL Ratio: 2.1 RATIO
Triglycerides: 25 mg/dL (ref <150)
VLDL: 5 mg/dL (ref 0–40)

## 2020-10-13 LAB — COMPREHENSIVE METABOLIC PANEL
ALT: 38 U/L (ref 0–44)
AST: 35 U/L (ref 15–41)
Albumin: 4.1 g/dL (ref 3.5–5.0)
Alkaline Phosphatase: 52 U/L (ref 38–126)
Anion gap: 6 (ref 5–15)
BUN: 22 mg/dL (ref 8–23)
CO2: 26 mmol/L (ref 22–32)
Calcium: 8.9 mg/dL (ref 8.9–10.3)
Chloride: 106 mmol/L (ref 98–111)
Creatinine, Ser: 0.98 mg/dL (ref 0.61–1.24)
GFR, Estimated: >60 mL/min (ref >60)
Glucose, Bld: 122 mg/dL — ABNORMAL HIGH (ref 70–99)
Potassium: 4.8 mmol/L (ref 3.5–5.1)
Sodium: 138 mmol/L (ref 135–145)
Total Bilirubin: 0.5 mg/dL (ref 0.3–1.2)
Total Protein: 6.9 g/dL (ref 6.5–8.1)

## 2020-10-13 LAB — CBC
HCT: 46.6 % (ref 39.0–52.0)
Hemoglobin: 15.3 g/dL (ref 13.0–17.0)
MCH: 32.5 pg (ref 26.0–34.0)
MCHC: 32.8 g/dL (ref 30.0–36.0)
MCV: 98.9 fL (ref 80.0–100.0)
Platelets: 133 10*3/uL — ABNORMAL LOW (ref 150–400)
RBC: 4.71 MIL/uL (ref 4.22–5.81)
RDW: 13 % (ref 11.5–15.5)
WBC: 6.1 10*3/uL (ref 4.0–10.5)
nRBC: 0 % (ref 0.0–0.2)

## 2020-10-13 LAB — MAGNESIUM: Magnesium: 2.1 mg/dL (ref 1.7–2.4)

## 2020-11-03 ENCOUNTER — Other Ambulatory Visit (HOSPITAL_COMMUNITY)
Admission: RE | Admit: 2020-11-03 | Discharge: 2020-11-03 | Disposition: A | Discharge status: Home / Self Care | Payer: Medicare Other | Source: Ambulatory Visit | Attending: Internal Medicine | Admitting: Internal Medicine

## 2020-11-03 DIAGNOSIS — J209 Acute bronchitis, unspecified: Secondary | ICD-10-CM | POA: Diagnosis not present

## 2020-11-03 DIAGNOSIS — Z0001 Encounter for general adult medical examination with abnormal findings: Secondary | ICD-10-CM | POA: Diagnosis not present

## 2020-11-03 DIAGNOSIS — E669 Obesity, unspecified: Secondary | ICD-10-CM | POA: Diagnosis not present

## 2020-11-03 DIAGNOSIS — Z23 Encounter for immunization: Secondary | ICD-10-CM | POA: Diagnosis not present

## 2020-11-03 DIAGNOSIS — I5022 Chronic systolic (congestive) heart failure: Secondary | ICD-10-CM | POA: Diagnosis not present

## 2020-11-03 DIAGNOSIS — Z1331 Encounter for screening for depression: Secondary | ICD-10-CM | POA: Diagnosis not present

## 2020-11-03 DIAGNOSIS — Z6828 Body mass index (BMI) 28.0-28.9, adult: Secondary | ICD-10-CM | POA: Diagnosis not present

## 2020-11-03 DIAGNOSIS — J449 Chronic obstructive pulmonary disease, unspecified: Secondary | ICD-10-CM | POA: Diagnosis not present

## 2020-11-03 DIAGNOSIS — I2510 Atherosclerotic heart disease of native coronary artery without angina pectoris: Secondary | ICD-10-CM | POA: Diagnosis not present

## 2020-11-03 DIAGNOSIS — R351 Nocturia: Secondary | ICD-10-CM | POA: Diagnosis not present

## 2020-11-03 LAB — PSA: Prostatic Specific Antigen: 1.92 ng/mL (ref 0.00–4.00)

## 2020-11-15 ENCOUNTER — Other Ambulatory Visit: Payer: Self-pay | Admitting: Cardiology

## 2020-11-16 ENCOUNTER — Ambulatory Visit (INDEPENDENT_AMBULATORY_CARE_PROVIDER_SITE_OTHER): Payer: Medicare Other

## 2020-11-16 DIAGNOSIS — I5022 Chronic systolic (congestive) heart failure: Secondary | ICD-10-CM | POA: Diagnosis not present

## 2020-11-16 LAB — CUP PACEART REMOTE DEVICE CHECK
Battery Remaining Longevity: 73 mo
Battery Voltage: 2.99 V
Brady Statistic RV Percent Paced: 0.58 %
Date Time Interrogation Session: 20221025042409
HighPow Impedance: 43 Ohm
HighPow Impedance: 56 Ohm
Implantable Lead Implant Date: 20080624
Implantable Lead Location: 753860
Implantable Lead Model: 6947
Implantable Pulse Generator Implant Date: 20170714
Lead Channel Impedance Value: 456 Ohm
Lead Channel Impedance Value: 513 Ohm
Lead Channel Pacing Threshold Amplitude: 0.5 V
Lead Channel Pacing Threshold Pulse Width: 0.4 ms
Lead Channel Sensing Intrinsic Amplitude: 8.125 mV
Lead Channel Sensing Intrinsic Amplitude: 8.125 mV
Lead Channel Setting Pacing Amplitude: 2.5 V
Lead Channel Setting Pacing Pulse Width: 0.4 ms
Lead Channel Setting Sensing Sensitivity: 0.9 mV

## 2020-11-24 NOTE — Progress Notes (Signed)
Remote ICD transmission.   

## 2020-12-15 ENCOUNTER — Other Ambulatory Visit: Payer: Self-pay

## 2020-12-15 ENCOUNTER — Ambulatory Visit
Admission: EM | Admit: 2020-12-15 | Discharge: 2020-12-15 | Disposition: A | Discharge status: Home / Self Care | Payer: Medicare Other | Attending: Family Medicine | Admitting: Family Medicine

## 2020-12-15 DIAGNOSIS — R051 Acute cough: Secondary | ICD-10-CM

## 2020-12-15 DIAGNOSIS — U071 COVID-19: Secondary | ICD-10-CM | POA: Diagnosis not present

## 2020-12-15 MED ORDER — BENZONATATE 100 MG PO CAPS: ORAL_CAPSULE | ORAL | 0 refills | Status: DC
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
## 2020-12-15 NOTE — ED Triage Notes (Signed)
Patient states he tested positive for the Covid with a home test. He states his symptoms started on Saturday. He has a cough and runny nose with a low grade temp.He took tylenol this morning at 8am. He would not like to test today for Covid and Flu

## 2021-02-04 ENCOUNTER — Other Ambulatory Visit: Payer: Self-pay

## 2021-02-04 MED ORDER — ENTRESTO 24-26 MG PO TABS: 1.0000 | ORAL_TABLET | Freq: Two times a day (BID) | ORAL | 11 refills | Status: DC

## 2021-02-04 NOTE — Telephone Encounter (Signed)
This is a [place] pt.  °

## 2021-02-15 ENCOUNTER — Ambulatory Visit (INDEPENDENT_AMBULATORY_CARE_PROVIDER_SITE_OTHER): Payer: Medicare HMO

## 2021-02-15 DIAGNOSIS — I255 Ischemic cardiomyopathy: Secondary | ICD-10-CM | POA: Diagnosis not present

## 2021-02-16 LAB — CUP PACEART REMOTE DEVICE CHECK
Battery Remaining Longevity: 69 mo
Battery Voltage: 2.99 V
Brady Statistic RV Percent Paced: 0.43 %
Date Time Interrogation Session: 20230125033526
HighPow Impedance: 43 Ohm
HighPow Impedance: 55 Ohm
Implantable Lead Implant Date: 20080624
Implantable Lead Location: 753860
Implantable Lead Model: 6947
Implantable Pulse Generator Implant Date: 20170714
Lead Channel Impedance Value: 418 Ohm
Lead Channel Impedance Value: 513 Ohm
Lead Channel Pacing Threshold Amplitude: 0.375 V
Lead Channel Pacing Threshold Pulse Width: 0.4 ms
Lead Channel Sensing Intrinsic Amplitude: 7.75 mV
Lead Channel Sensing Intrinsic Amplitude: 7.75 mV
Lead Channel Setting Pacing Amplitude: 2.5 V
Lead Channel Setting Pacing Pulse Width: 0.4 ms
Lead Channel Setting Sensing Sensitivity: 0.9 mV

## 2021-02-25 NOTE — Progress Notes (Signed)
Remote ICD transmission.   

## 2021-03-02 ENCOUNTER — Telehealth: Payer: Self-pay | Admitting: Cardiology

## 2021-03-02 MED ORDER — ATORVASTATIN CALCIUM 80 MG PO TABS: ORAL_TABLET | ORAL | 3 refills | Status: DC

## 2021-03-02 MED ORDER — DAPAGLIFLOZIN PROPANEDIOL 10 MG PO TABS: 10.0000 mg | ORAL_TABLET | Freq: Every day | ORAL | 6 refills | Status: DC

## 2021-03-02 NOTE — Telephone Encounter (Signed)
[facility] now needs prescriptions sent to [facility] with [facility] mail in order. Medications atorvastatin and, farxiga.

## 2021-03-02 NOTE — Telephone Encounter (Signed)
Completed.

## 2021-03-10 ENCOUNTER — Telehealth: Payer: Self-pay | Admitting: Cardiology

## 2021-03-10 MED ORDER — CARVEDILOL 3.125 MG PO TABS: 3.1250 mg | ORAL_TABLET | Freq: Two times a day (BID) | ORAL | 3 refills | Status: DC

## 2021-03-10 NOTE — Telephone Encounter (Signed)
*  STAT* If patient is at the pharmacy, call can be transferred to refill team.   1. Which medications need to be refilled? (please list name of each medication and dose if known) carvedilol (COREG) 3.125 MG tablet  2. Which pharmacy/location (including street and city if local pharmacy) is medication to be sent to? [place]   3. Do they need a 30 day or 90 day supply? 90 day   Patient requests his refills be sent to [place]

## 2021-03-10 NOTE — Telephone Encounter (Signed)
Completed.

## 2021-04-25 ENCOUNTER — Telehealth: Payer: Self-pay | Admitting: Cardiology

## 2021-04-25 MED ORDER — ENTRESTO 24-26 MG PO TABS: 1.0000 | ORAL_TABLET | Freq: Two times a day (BID) | ORAL | 1 refills | Status: DC

## 2021-04-25 NOTE — Telephone Encounter (Signed)
Completed.

## 2021-04-25 NOTE — Telephone Encounter (Signed)
?*  STAT* If patient is at the pharmacy, call can be transferred to refill team. ? ? ?1. Which medications need to be refilled? (please list name of each medication and dose if known)  ? sacubitril-valsartan (ENTRESTO) 24-26 MG Take 1 tablet by mouth 2 (two) times daily.  ?  ? ?2. Which pharmacy/location (including street and city if local pharmacy) is medication to be sent to? [facility] ? [address] [place], [place] [address] ? ?3. Do they need a 30 day or 90 day supply? 90 day ?

## 2021-05-03 ENCOUNTER — Encounter: Payer: Self-pay | Admitting: Cardiology

## 2021-05-03 ENCOUNTER — Ambulatory Visit: Payer: Medicare HMO | Admitting: Cardiology

## 2021-05-03 VITALS — BP 132/70 | HR 45 | Ht 70.5 in | Wt 207.8 lb

## 2021-05-03 DIAGNOSIS — I2510 Atherosclerotic heart disease of native coronary artery without angina pectoris: Secondary | ICD-10-CM | POA: Diagnosis not present

## 2021-05-03 DIAGNOSIS — I472 Ventricular tachycardia, unspecified: Secondary | ICD-10-CM | POA: Diagnosis not present

## 2021-05-03 DIAGNOSIS — I255 Ischemic cardiomyopathy: Secondary | ICD-10-CM

## 2021-05-03 DIAGNOSIS — I5022 Chronic systolic (congestive) heart failure: Secondary | ICD-10-CM

## 2021-05-03 DIAGNOSIS — I10 Essential (primary) hypertension: Secondary | ICD-10-CM

## 2021-05-03 NOTE — Addendum Note (Signed)
Addended by: Sung Amabile on: 05/03/2021 03:03 PM ? ? Modules accepted: Orders ? ?
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
## 2021-05-03 NOTE — Patient Instructions (Addendum)
Medication Instructions:  ?Continue all current medications. ? ?Labwork: ?none ? ?Testing/Procedures: ?none ? ?Follow-Up: ?4 months - [place] office  ? ?Any Other Special Instructions Will Be Listed Below (If Applicable). ?Follow up with Dr. Radford Pax - sleep apnea  ?Farxiga assistance - Call [phone]) toll-free, 8:00 AM to 8:00 PM EST, Monday?Friday. ? ?If you need a refill on your cardiac medications before your next appointment, please call your pharmacy. ? ?

## 2021-05-12 ENCOUNTER — Encounter: Payer: Self-pay | Admitting: Internal Medicine

## 2021-05-12 ENCOUNTER — Ambulatory Visit: Payer: Medicare HMO | Admitting: Internal Medicine

## 2021-05-12 VITALS — BP 108/62 | HR 58 | Ht 70.0 in | Wt 205.0 lb

## 2021-05-12 DIAGNOSIS — I2510 Atherosclerotic heart disease of native coronary artery without angina pectoris: Secondary | ICD-10-CM | POA: Diagnosis not present

## 2021-05-12 DIAGNOSIS — I5022 Chronic systolic (congestive) heart failure: Secondary | ICD-10-CM

## 2021-05-12 DIAGNOSIS — I472 Ventricular tachycardia, unspecified: Secondary | ICD-10-CM

## 2021-05-12 NOTE — Patient Instructions (Signed)
Medication Instructions:  Your physician recommends that you continue on your current medications as directed. Please refer to the Current Medication list given to you today.  *If you need a refill on your cardiac medications before your next appointment, please call your pharmacy*   Lab Work: NONE   If you have labs (blood work) drawn today and your tests are completely normal, you will receive your results only by: MyChart Message (if you have MyChart) OR A paper copy in the mail If you have any lab test that is abnormal or we need to change your treatment, we will call you to review the results.   Testing/Procedures: NONE    Follow-Up: At [facility], you and your health needs are our priority.  As part of our continuing mission to provide you with exceptional heart care, we have created designated Provider Care Teams.  These Care Teams include your primary Cardiologist (physician) and Advanced Practice Providers (APPs -  Physician Assistants and Nurse Practitioners) who all work together to provide you with the care you need, when you need it.  We recommend signing up for the patient portal called "MyChart".  Sign up information is provided on this After Visit Summary.  MyChart is used to connect with patients for Virtual Visits (Telemedicine).  Patients are able to view lab/test results, encounter notes, upcoming appointments, etc.  Non-urgent messages can be sent to your provider as well.   To learn more about what you can do with MyChart, go to [email or web address].    Your next appointment:   1 year(s)  The format for your next appointment:   In Person  Provider:   Gregg Taylor, MD    Other Instructions Thank you for choosing [facility]!    Important Information About Sugar       
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
## 2021-05-17 ENCOUNTER — Ambulatory Visit (INDEPENDENT_AMBULATORY_CARE_PROVIDER_SITE_OTHER): Payer: Medicare HMO

## 2021-05-17 DIAGNOSIS — I255 Ischemic cardiomyopathy: Secondary | ICD-10-CM

## 2021-05-17 DIAGNOSIS — I5022 Chronic systolic (congestive) heart failure: Secondary | ICD-10-CM | POA: Diagnosis not present

## 2021-05-17 LAB — CUP PACEART REMOTE DEVICE CHECK
Battery Remaining Longevity: 67 mo
Battery Voltage: 2.99 V
Brady Statistic RV Percent Paced: 0.3 %
Date Time Interrogation Session: 20230425043726
HighPow Impedance: 45 Ohm
HighPow Impedance: 58 Ohm
Implantable Lead Implant Date: 20080624
Implantable Lead Location: 753860
Implantable Lead Model: 6947
Implantable Pulse Generator Implant Date: 20170714
Lead Channel Impedance Value: 456 Ohm
Lead Channel Impedance Value: 532 Ohm
Lead Channel Pacing Threshold Amplitude: 0.375 V
Lead Channel Pacing Threshold Pulse Width: 0.4 ms
Lead Channel Sensing Intrinsic Amplitude: 8.25 mV
Lead Channel Sensing Intrinsic Amplitude: 8.25 mV
Lead Channel Setting Pacing Amplitude: 2.5 V
Lead Channel Setting Pacing Pulse Width: 0.4 ms
Lead Channel Setting Sensing Sensitivity: 0.9 mV

## 2021-05-18 ENCOUNTER — Other Ambulatory Visit: Payer: Self-pay | Admitting: Family Medicine

## 2021-05-18 DIAGNOSIS — M25552 Pain in left hip: Secondary | ICD-10-CM

## 2021-05-23 ENCOUNTER — Telehealth: Payer: Self-pay

## 2021-05-23 NOTE — Telephone Encounter (Signed)
**Note De-Identified [place] Bont[employer]po Obfuscation** Letter received [name] fax from [employer] pt asst foundation stating that they have approved the pt for assistance with Entresto until 01/22/2022. ?Patient ID: [id number] ? ?The letter states that they have notified the pt of this approval as well. ?

## 2021-06-02 NOTE — Progress Notes (Signed)
Remote ICD transmission.   

## 2021-06-08 ENCOUNTER — Other Ambulatory Visit: Payer: Self-pay | Admitting: Internal Medicine

## 2021-07-05 DIAGNOSIS — F1010 Alcohol abuse, uncomplicated: Secondary | ICD-10-CM | POA: Diagnosis not present

## 2021-07-05 DIAGNOSIS — Z951 Presence of aortocoronary bypass graft: Secondary | ICD-10-CM | POA: Diagnosis not present

## 2021-07-05 DIAGNOSIS — I2510 Atherosclerotic heart disease of native coronary artery without angina pectoris: Secondary | ICD-10-CM | POA: Diagnosis not present

## 2021-07-05 DIAGNOSIS — I350 Nonrheumatic aortic (valve) stenosis: Secondary | ICD-10-CM | POA: Diagnosis not present

## 2021-07-05 DIAGNOSIS — G4733 Obstructive sleep apnea (adult) (pediatric): Secondary | ICD-10-CM | POA: Diagnosis not present

## 2021-07-05 DIAGNOSIS — K8681 Exocrine pancreatic insufficiency: Secondary | ICD-10-CM | POA: Diagnosis not present

## 2021-07-05 DIAGNOSIS — I10 Essential (primary) hypertension: Secondary | ICD-10-CM | POA: Diagnosis not present

## 2021-07-05 DIAGNOSIS — K861 Other chronic pancreatitis: Secondary | ICD-10-CM | POA: Diagnosis not present

## 2021-07-05 DIAGNOSIS — K860 Alcohol-induced chronic pancreatitis: Secondary | ICD-10-CM | POA: Diagnosis not present

## 2021-07-05 DIAGNOSIS — E785 Hyperlipidemia, unspecified: Secondary | ICD-10-CM | POA: Diagnosis not present

## 2021-08-01 DIAGNOSIS — K861 Other chronic pancreatitis: Secondary | ICD-10-CM | POA: Diagnosis not present

## 2021-08-04 ENCOUNTER — Encounter: Payer: Self-pay | Admitting: Cardiology

## 2021-08-04 ENCOUNTER — Ambulatory Visit: Payer: Medicare HMO | Admitting: Cardiology

## 2021-08-04 VITALS — BP 128/80 | HR 56 | Ht 70.5 in | Wt 202.0 lb

## 2021-08-04 DIAGNOSIS — G4733 Obstructive sleep apnea (adult) (pediatric): Secondary | ICD-10-CM

## 2021-08-04 DIAGNOSIS — I10 Essential (primary) hypertension: Secondary | ICD-10-CM | POA: Diagnosis not present
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
## 2021-08-16 ENCOUNTER — Ambulatory Visit (INDEPENDENT_AMBULATORY_CARE_PROVIDER_SITE_OTHER): Payer: Medicare HMO

## 2021-08-16 DIAGNOSIS — I255 Ischemic cardiomyopathy: Secondary | ICD-10-CM

## 2021-08-16 LAB — CUP PACEART REMOTE DEVICE CHECK
Battery Remaining Longevity: 65 mo
Battery Voltage: 2.99 V
Brady Statistic RV Percent Paced: 0.62 %
Date Time Interrogation Session: 20230725033628
HighPow Impedance: 43 Ohm
HighPow Impedance: 62 Ohm
Implantable Lead Implant Date: 20080624
Implantable Lead Location: 753860
Implantable Lead Model: 6947
Implantable Pulse Generator Implant Date: 20170714
Lead Channel Impedance Value: 475 Ohm
Lead Channel Impedance Value: 513 Ohm
Lead Channel Pacing Threshold Amplitude: 0.375 V
Lead Channel Pacing Threshold Pulse Width: 0.4 ms
Lead Channel Sensing Intrinsic Amplitude: 7.25 mV
Lead Channel Sensing Intrinsic Amplitude: 7.25 mV
Lead Channel Setting Pacing Amplitude: 2.5 V
Lead Channel Setting Pacing Pulse Width: 0.4 ms
Lead Channel Setting Sensing Sensitivity: 0.9 mV

## 2021-09-05 ENCOUNTER — Encounter: Payer: Self-pay | Admitting: Cardiology

## 2021-09-05 ENCOUNTER — Ambulatory Visit: Payer: Medicare HMO | Admitting: Cardiology

## 2021-09-05 VITALS — BP 130/78 | HR 51 | Ht 70.5 in | Wt 203.4 lb

## 2021-09-05 DIAGNOSIS — I5022 Chronic systolic (congestive) heart failure: Secondary | ICD-10-CM | POA: Diagnosis not present

## 2021-09-05 DIAGNOSIS — E782 Mixed hyperlipidemia: Secondary | ICD-10-CM | POA: Diagnosis not present

## 2021-09-05 DIAGNOSIS — Z125 Encounter for screening for malignant neoplasm of prostate: Secondary | ICD-10-CM

## 2021-09-05 DIAGNOSIS — Z131 Encounter for screening for diabetes mellitus: Secondary | ICD-10-CM

## 2021-09-05 DIAGNOSIS — Z1329 Encounter for screening for other suspected endocrine disorder: Secondary | ICD-10-CM

## 2021-09-05 DIAGNOSIS — I10 Essential (primary) hypertension: Secondary | ICD-10-CM

## 2021-09-05 DIAGNOSIS — Z79899 Other long term (current) drug therapy: Secondary | ICD-10-CM | POA: Diagnosis not present

## 2021-09-05 NOTE — Patient Instructions (Signed)
Medication Instructions:  Continue all current medications.  Labwork: CMET, CBC, TSH, PSA, HgbA1c, FLP, MG - orders given today Reminder:  Nothing to eat or drink after 12 midnight prior to labs. Office will contact with results via phone, letter or mychart.     Testing/Procedures: none  Follow-Up: 6 months   Any Other Special Instructions Will Be Listed Below (If Applicable).   If you need a refill on your cardiac medications before your next appointment, please call your pharmacy.
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
## 2021-09-07 ENCOUNTER — Telehealth: Payer: Self-pay | Admitting: *Deleted

## 2021-09-07 DIAGNOSIS — G4733 Obstructive sleep apnea (adult) (pediatric): Secondary | ICD-10-CM

## 2021-09-07 NOTE — Telephone Encounter (Signed)
-----   Message from Sueanne Margarita, MD sent at 09/05/2021 10:33 PM EDT ----- Gae Bon can you check on this ----- Message ----- From: Arnoldo Lenis, MD Sent: 09/05/2021   1:28 PM EDT To: Sueanne Margarita, MD  Eyvonne Mechanic hope you are well, Mutual patient of ours, in July you had planned to order a new cpap and chin strap for him. He wasn't sure if he was supposed to call a specific company or what the next step was to get his new machine. Could you have your nurse contact him and guide him through.    Roderic Palau

## 2021-09-07 NOTE — Telephone Encounter (Signed)
Per Dr Radford Pax, new ResMed auto CPAP from 5-20cm H2O with heated humidity and mask of choice -order chin strap to help with dry mouth

## 2021-09-09 ENCOUNTER — Other Ambulatory Visit (HOSPITAL_COMMUNITY)
Admission: RE | Admit: 2021-09-09 | Discharge: 2021-09-09 | Disposition: A | Discharge status: Home / Self Care | Payer: Medicare HMO | Source: Ambulatory Visit | Attending: Cardiology | Admitting: Cardiology

## 2021-09-09 DIAGNOSIS — E782 Mixed hyperlipidemia: Secondary | ICD-10-CM | POA: Insufficient documentation

## 2021-09-09 DIAGNOSIS — R3589 Other polyuria: Secondary | ICD-10-CM | POA: Diagnosis not present

## 2021-09-09 DIAGNOSIS — Z125 Encounter for screening for malignant neoplasm of prostate: Secondary | ICD-10-CM | POA: Insufficient documentation

## 2021-09-09 DIAGNOSIS — Z131 Encounter for screening for diabetes mellitus: Secondary | ICD-10-CM | POA: Insufficient documentation

## 2021-09-09 DIAGNOSIS — Z1329 Encounter for screening for other suspected endocrine disorder: Secondary | ICD-10-CM | POA: Diagnosis not present

## 2021-09-09 DIAGNOSIS — Z79899 Other long term (current) drug therapy: Secondary | ICD-10-CM | POA: Insufficient documentation

## 2021-09-09 DIAGNOSIS — I10 Essential (primary) hypertension: Secondary | ICD-10-CM | POA: Diagnosis not present

## 2021-09-09 LAB — LIPID PANEL
Cholesterol: 78 mg/dL (ref 0–200)
HDL: 38 mg/dL — ABNORMAL LOW (ref >40)
LDL Cholesterol: 34 mg/dL (ref 0–99)
Total CHOL/HDL Ratio: 2.1 RATIO
Triglycerides: 31 mg/dL (ref <150)
VLDL: 6 mg/dL (ref 0–40)

## 2021-09-09 LAB — COMPREHENSIVE METABOLIC PANEL
ALT: 31 U/L (ref 0–44)
AST: 26 U/L (ref 15–41)
Albumin: 3.9 g/dL (ref 3.5–5.0)
Alkaline Phosphatase: 53 U/L (ref 38–126)
Anion gap: 4 — ABNORMAL LOW (ref 5–15)
BUN: 23 mg/dL (ref 8–23)
CO2: 25 mmol/L (ref 22–32)
Calcium: 8.9 mg/dL (ref 8.9–10.3)
Chloride: 111 mmol/L (ref 98–111)
Creatinine, Ser: 0.85 mg/dL (ref 0.61–1.24)
GFR, Estimated: >60 mL/min (ref >60)
Glucose, Bld: 124 mg/dL — ABNORMAL HIGH (ref 70–99)
Potassium: 4.2 mmol/L (ref 3.5–5.1)
Sodium: 140 mmol/L (ref 135–145)
Total Bilirubin: 0.6 mg/dL (ref 0.3–1.2)
Total Protein: 6.7 g/dL (ref 6.5–8.1)

## 2021-09-09 LAB — CBC
HCT: 46.1 % (ref 39.0–52.0)
Hemoglobin: 15.2 g/dL (ref 13.0–17.0)
MCH: 31.7 pg (ref 26.0–34.0)
MCHC: 33 g/dL (ref 30.0–36.0)
MCV: 96.2 fL (ref 80.0–100.0)
Platelets: 122 10*3/uL — ABNORMAL LOW (ref 150–400)
RBC: 4.79 MIL/uL (ref 4.22–5.81)
RDW: 13 % (ref 11.5–15.5)
WBC: 6 10*3/uL (ref 4.0–10.5)
nRBC: 0 % (ref 0.0–0.2)

## 2021-09-09 LAB — HEMOGLOBIN A1C
Hgb A1c MFr Bld: 6.3 % — ABNORMAL HIGH (ref 4.8–5.6)
Mean Plasma Glucose: 134.11 mg/dL

## 2021-09-09 LAB — PSA: Prostatic Specific Antigen: 1.51 ng/mL (ref 0.00–4.00)

## 2021-09-09 LAB — TSH: TSH: 1.511 u[IU]/mL (ref 0.350–4.500)

## 2021-09-09 LAB — MAGNESIUM: Magnesium: 2 mg/dL (ref 1.7–2.4)

## 2021-09-13 NOTE — Progress Notes (Signed)
Remote ICD transmission.   

## 2021-09-15 ENCOUNTER — Encounter: Payer: Self-pay | Admitting: *Deleted

## 2021-09-26 ENCOUNTER — Ambulatory Visit
Admission: EM | Admit: 2021-09-26 | Discharge: 2021-09-26 | Disposition: A | Discharge status: Home / Self Care | Payer: Medicare HMO | Attending: Family Medicine | Admitting: Family Medicine

## 2021-09-26 DIAGNOSIS — R059 Cough, unspecified: Secondary | ICD-10-CM | POA: Insufficient documentation

## 2021-09-26 DIAGNOSIS — J069 Acute upper respiratory infection, unspecified: Secondary | ICD-10-CM | POA: Insufficient documentation

## 2021-09-26 DIAGNOSIS — Z20822 Contact with and (suspected) exposure to covid-19: Secondary | ICD-10-CM | POA: Insufficient documentation

## 2021-09-26 DIAGNOSIS — Z79899 Other long term (current) drug therapy: Secondary | ICD-10-CM | POA: Insufficient documentation

## 2021-09-26 DIAGNOSIS — H6122 Impacted cerumen, left ear: Secondary | ICD-10-CM | POA: Insufficient documentation

## 2021-09-26 LAB — RESP PANEL BY RT-PCR (FLU A&B, COVID) ARPGX2
Influenza A by PCR: NEGATIVE
Influenza B by PCR: NEGATIVE
SARS Coronavirus 2 by RT PCR: NEGATIVE

## 2021-09-26 MED ORDER — GUAIFENESIN ER 600 MG PO TB12
600.0000 mg | ORAL_TABLET | Freq: Two times a day (BID) | ORAL | 0 refills | Status: DC
Start: 2021-09-26 — End: 2023-08-09

## 2021-09-26 MED ORDER — BENZONATATE 200 MG PO CAPS
200.0000 mg | ORAL_CAPSULE | Freq: Three times a day (TID) | ORAL | 0 refills | Status: DC | PRN
Start: 2021-09-26 — End: 2023-08-09

## 2021-09-26 MED ORDER — FLUTICASONE PROPIONATE 50 MCG/ACT NA SUSP: 1.0000 | Freq: Two times a day (BID) | NASAL | 0 refills | Status: DC

## 2021-09-26 NOTE — ED Triage Notes (Signed)
Pt present cough and fever, symptoms started Friday.
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
## 2021-09-30 DIAGNOSIS — I10 Essential (primary) hypertension: Secondary | ICD-10-CM | POA: Diagnosis not present

## 2021-09-30 DIAGNOSIS — Z6828 Body mass index (BMI) 28.0-28.9, adult: Secondary | ICD-10-CM | POA: Diagnosis not present

## 2021-09-30 DIAGNOSIS — E6609 Other obesity due to excess calories: Secondary | ICD-10-CM | POA: Diagnosis not present

## 2021-09-30 DIAGNOSIS — I2510 Atherosclerotic heart disease of native coronary artery without angina pectoris: Secondary | ICD-10-CM | POA: Diagnosis not present

## 2021-09-30 DIAGNOSIS — I714 Abdominal aortic aneurysm, without rupture, unspecified: Secondary | ICD-10-CM | POA: Diagnosis not present

## 2021-09-30 DIAGNOSIS — J449 Chronic obstructive pulmonary disease, unspecified: Secondary | ICD-10-CM | POA: Diagnosis not present

## 2021-11-11 DIAGNOSIS — I10 Essential (primary) hypertension: Secondary | ICD-10-CM | POA: Diagnosis not present

## 2021-11-11 DIAGNOSIS — Z125 Encounter for screening for malignant neoplasm of prostate: Secondary | ICD-10-CM | POA: Diagnosis not present

## 2021-11-11 DIAGNOSIS — E663 Overweight: Secondary | ICD-10-CM | POA: Diagnosis not present

## 2021-11-11 DIAGNOSIS — J449 Chronic obstructive pulmonary disease, unspecified: Secondary | ICD-10-CM | POA: Diagnosis not present

## 2021-11-11 DIAGNOSIS — E782 Mixed hyperlipidemia: Secondary | ICD-10-CM | POA: Diagnosis not present

## 2021-11-11 DIAGNOSIS — I714 Abdominal aortic aneurysm, without rupture, unspecified: Secondary | ICD-10-CM | POA: Diagnosis not present

## 2021-11-11 DIAGNOSIS — I5022 Chronic systolic (congestive) heart failure: Secondary | ICD-10-CM | POA: Diagnosis not present

## 2021-11-11 DIAGNOSIS — Z0001 Encounter for general adult medical examination with abnormal findings: Secondary | ICD-10-CM | POA: Diagnosis not present

## 2021-11-11 DIAGNOSIS — I2510 Atherosclerotic heart disease of native coronary artery without angina pectoris: Secondary | ICD-10-CM | POA: Diagnosis not present

## 2021-11-15 ENCOUNTER — Ambulatory Visit (INDEPENDENT_AMBULATORY_CARE_PROVIDER_SITE_OTHER): Payer: Medicare HMO

## 2021-11-15 DIAGNOSIS — I255 Ischemic cardiomyopathy: Secondary | ICD-10-CM

## 2021-11-15 LAB — CUP PACEART REMOTE DEVICE CHECK
Battery Remaining Longevity: 62 mo
Battery Voltage: 2.99 V
Brady Statistic RV Percent Paced: 1.03 %
Date Time Interrogation Session: 20231024012403
HighPow Impedance: 46 Ohm
HighPow Impedance: 60 Ohm
Implantable Lead Connection Status: 753985
Implantable Lead Implant Date: 20080624
Implantable Lead Location: 753860
Implantable Lead Model: 6947
Implantable Pulse Generator Implant Date: 20170714
Lead Channel Impedance Value: 456 Ohm
Lead Channel Impedance Value: 532 Ohm
Lead Channel Pacing Threshold Amplitude: 0.5 V
Lead Channel Pacing Threshold Pulse Width: 0.4 ms
Lead Channel Sensing Intrinsic Amplitude: 8 mV
Lead Channel Sensing Intrinsic Amplitude: 8 mV
Lead Channel Setting Pacing Amplitude: 2.5 V
Lead Channel Setting Pacing Pulse Width: 0.4 ms
Lead Channel Setting Sensing Sensitivity: 0.9 mV
Zone Setting Status: 755011

## 2021-12-05 NOTE — Progress Notes (Signed)
Remote ICD transmission.   

## 2022-01-13 DIAGNOSIS — J069 Acute upper respiratory infection, unspecified: Secondary | ICD-10-CM | POA: Diagnosis not present

## 2022-01-13 DIAGNOSIS — E669 Obesity, unspecified: Secondary | ICD-10-CM | POA: Diagnosis not present

## 2022-01-13 DIAGNOSIS — Z6830 Body mass index (BMI) 30.0-30.9, adult: Secondary | ICD-10-CM | POA: Diagnosis not present

## 2022-02-14 ENCOUNTER — Ambulatory Visit: Payer: Medicare HMO | Attending: Internal Medicine

## 2022-02-14 DIAGNOSIS — I255 Ischemic cardiomyopathy: Secondary | ICD-10-CM | POA: Diagnosis not present

## 2022-02-14 LAB — CUP PACEART REMOTE DEVICE CHECK
Battery Remaining Longevity: 58 mo
Battery Voltage: 2.98 V
Brady Statistic RV Percent Paced: 0.9 %
Date Time Interrogation Session: 20240123012404
HighPow Impedance: 46 Ohm
HighPow Impedance: 62 Ohm
Implantable Lead Connection Status: 753985
Implantable Lead Implant Date: 20080624
Implantable Lead Location: 753860
Implantable Lead Model: 6947
Implantable Pulse Generator Implant Date: 20170714
Lead Channel Impedance Value: 456 Ohm
Lead Channel Impedance Value: 532 Ohm
Lead Channel Pacing Threshold Amplitude: 0.375 V
Lead Channel Pacing Threshold Pulse Width: 0.4 ms
Lead Channel Sensing Intrinsic Amplitude: 10.25 mV
Lead Channel Sensing Intrinsic Amplitude: 10.25 mV
Lead Channel Setting Pacing Amplitude: 2.5 V
Lead Channel Setting Pacing Pulse Width: 0.4 ms
Lead Channel Setting Sensing Sensitivity: 0.9 mV
Zone Setting Status: 755011

## 2022-02-22 DIAGNOSIS — I5022 Chronic systolic (congestive) heart failure: Secondary | ICD-10-CM | POA: Diagnosis not present

## 2022-02-22 DIAGNOSIS — E663 Overweight: Secondary | ICD-10-CM | POA: Diagnosis not present

## 2022-02-22 DIAGNOSIS — Z6829 Body mass index (BMI) 29.0-29.9, adult: Secondary | ICD-10-CM | POA: Diagnosis not present

## 2022-02-22 DIAGNOSIS — J209 Acute bronchitis, unspecified: Secondary | ICD-10-CM | POA: Diagnosis not present

## 2022-02-22 DIAGNOSIS — J449 Chronic obstructive pulmonary disease, unspecified: Secondary | ICD-10-CM | POA: Diagnosis not present

## 2022-03-10 ENCOUNTER — Ambulatory Visit: Payer: Medicare HMO | Attending: Cardiology | Admitting: Cardiology

## 2022-03-10 ENCOUNTER — Encounter: Payer: Self-pay | Admitting: Cardiology

## 2022-03-10 VITALS — BP 150/70 | HR 56 | Ht 70.5 in | Wt 213.0 lb

## 2022-03-10 DIAGNOSIS — I10 Essential (primary) hypertension: Secondary | ICD-10-CM | POA: Diagnosis not present

## 2022-03-10 DIAGNOSIS — I5022 Chronic systolic (congestive) heart failure: Secondary | ICD-10-CM

## 2022-03-10 DIAGNOSIS — Z79899 Other long term (current) drug therapy: Secondary | ICD-10-CM | POA: Diagnosis not present

## 2022-03-10 DIAGNOSIS — I472 Ventricular tachycardia, unspecified: Secondary | ICD-10-CM | POA: Diagnosis not present

## 2022-03-10 DIAGNOSIS — I2510 Atherosclerotic heart disease of native coronary artery without angina pectoris: Secondary | ICD-10-CM

## 2022-03-10 NOTE — Patient Instructions (Addendum)
Medication Instructions:  Your physician recommends that you continue on your current medications as directed. Please refer to the Current Medication list given to you today.   Labwork: None  Testing/Procedures: None  Follow-Up: Follow up with Dr. Harl Bowie in 6 months.   Any Other Special Instructions Will Be Listed Below (If Applicable).  You have been referred to Pharm D. They will contact you with your first appointment.    If you need a refill on your cardiac medications before your next appointment, please call your pharmacy.
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
## 2022-03-14 ENCOUNTER — Telehealth: Payer: Self-pay | Admitting: *Deleted

## 2022-03-14 NOTE — Telephone Encounter (Signed)
Called and notified pt that he has been approved for the [facility] patient assistance program.

## 2022-03-14 NOTE — Progress Notes (Signed)
Remote ICD transmission.   

## 2022-03-21 ENCOUNTER — Other Ambulatory Visit: Payer: Self-pay | Admitting: Cardiology

## 2022-03-22 ENCOUNTER — Telehealth: Payer: Self-pay | Admitting: Cardiology

## 2022-03-22 MED ORDER — ATORVASTATIN CALCIUM 80 MG PO TABS: ORAL_TABLET | ORAL | 3 refills | Status: DC

## 2022-03-22 NOTE — Telephone Encounter (Signed)
Completed.

## 2022-03-22 NOTE — Telephone Encounter (Signed)
*  STAT* If patient is at the pharmacy, call can be transferred to refill team.   1. Which medications need to be refilled? (please list name of each medication and dose if known) atorvastatin (LIPITOR) 80 MG tablet   2. Which pharmacy/location (including street and city if local pharmacy) is medication to be sent to?  [id number] [place] [phone] Fax: [phone]     3. Do they need a 30 day or 90 day supply? 333 Pt stated mail order will not come in for another week, he is completely out now. Requesting 30 day to local pharmacy.

## 2022-03-24 ENCOUNTER — Telehealth: Payer: Self-pay | Admitting: Cardiology

## 2022-03-24 MED ORDER — ATORVASTATIN CALCIUM 80 MG PO TABS: ORAL_TABLET | ORAL | 0 refills | Status: DC

## 2022-03-24 NOTE — Addendum Note (Signed)
Addended by: Christella Scheuermann C on: 03/24/2022 03:20 PM   Modules accepted: Orders

## 2022-03-24 NOTE — Telephone Encounter (Signed)
Spoke to pharmacy who stated that they were having issues with receiving E-Scripts. Verbalized order for Atorvastatin 80 mg tablets- 1 tablet by mouth daily- #90 with 3 refills.

## 2022-03-24 NOTE — Telephone Encounter (Signed)
*  STAT* If patient is at the pharmacy, call can be transferred to refill team.   1. Which medications need to be refilled? (please list name of each medication and dose if known)     atorvastatin (LIPITOR) 80 MG tablet    2. Which pharmacy/location (including street and city if local pharmacy) is medication to be sent to?   [place] [place] Phone: [phone]  Fax: [phone]     3. Do they need a 30 day or 90 day supply? 90    I put the wrong pharmacy on last encounter, pt asked that it be sent to [facility].

## 2022-04-14 ENCOUNTER — Telehealth: Payer: Self-pay | Admitting: Pharmacist

## 2022-04-14 ENCOUNTER — Encounter: Payer: Self-pay | Admitting: Student

## 2022-04-14 ENCOUNTER — Ambulatory Visit: Payer: Medicare HMO | Attending: Cardiology | Admitting: Student

## 2022-04-14 VITALS — BP 159/73 | HR 47

## 2022-04-14 DIAGNOSIS — I255 Ischemic cardiomyopathy: Secondary | ICD-10-CM

## 2022-04-14 DIAGNOSIS — I5022 Chronic systolic (congestive) heart failure: Secondary | ICD-10-CM

## 2022-04-14 MED ORDER — ENTRESTO 49-51 MG PO TABS: 1.0000 | ORAL_TABLET | Freq: Two times a day (BID) | ORAL | Status: DC
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
## 2022-04-14 NOTE — Patient Instructions (Addendum)
Changes made by your pharmacist Cammy Copa, PharmD at today's visit:    Instructions/Changes  (what do you need to do) Your Notes  (what you did and when you did it)  Continue taking carvedilol 3.125 mg twice daily    2.   Increase Entresto to  49/51 mg twice daily ( finish taking current supply by taking 2 tablets of 24/26 twice daily)   3. Will work on [place] coverage       If you have any questions or concerns please use My Chart to send questions or call the office at [phone]

## 2022-04-14 NOTE — Assessment & Plan Note (Addendum)
Assessment: BP is uncontrolled in office BP 156/77 with heart rate 47  Patient takes Entresto and carvedilol regularly and tolerates them well without any side effects  Denies SOB, palpitation, chest pain, headaches,or swelling Reiterated the importance of regular exercise and low salt diet - [facility]  Plan:  Start taking Entresto 24/26 mg 2 tablets twice daily will send updated prescription to the patient assistance program  Continue taking carvedilol 3.125 mg twice daily   Will initiate AZ&Me application for patient, patient will be bringing form back with income proof  Patient to keep record of BP readings with heart rate and report to [place] at the next visit Patient to see PharmD in 4-5 weeks for follow up  Follow up lab(s) BMP in 2 weeks of starting increased dose Entresto

## 2022-04-18 ENCOUNTER — Other Ambulatory Visit (HOSPITAL_COMMUNITY): Payer: Self-pay

## 2022-04-18 ENCOUNTER — Telehealth: Payer: Self-pay

## 2022-04-18 NOTE — Telephone Encounter (Signed)
Pharmacy Patient Advocate Encounter  Prior Authorization for REPATHA has been approved.    PA# [id number] Effective dates: 01/23/22 through 01/23/23  Karie Soda, [place] Patient Advocate Specialist Direct Number: [phone] Fax: [phone]

## 2022-04-18 NOTE — Telephone Encounter (Signed)
Pharmacy Patient Advocate Encounter   Received notification from [facility] that prior authorization for REPATHA is needed.    PA submitted on 04/18/22 Key [id number] Status is pending  Karie Soda, [place] Patient Advocate Specialist Direct Number: [phone] Fax: [phone]

## 2022-04-18 NOTE — Telephone Encounter (Signed)
  NO PA REQUIRED FOR ENTRESTO. INITIATING PA FOR REPATHA.

## 2022-04-19 ENCOUNTER — Other Ambulatory Visit: Payer: Self-pay | Admitting: Internal Medicine

## 2022-04-19 NOTE — Addendum Note (Signed)
Addended by: Anda Latina on: 04/19/2022 09:27 AM   Modules accepted: Orders

## 2022-04-20 ENCOUNTER — Other Ambulatory Visit (HOSPITAL_COMMUNITY): Payer: Self-pay

## 2022-04-20 NOTE — Telephone Encounter (Signed)
Patient will drop off AZ&me form on Monday Apr 1,2024.  No change require for cholesterol medications. Last LDLc 34 mg/dl on current therapy (Lipitor 80 mg daily)

## 2022-05-05 NOTE — Telephone Encounter (Signed)
Call to remind him to drop off AZ&Me application and for follow up BMP ( last visit Entresto was increased). Reports he was not well couple Friday ago then he forgot all about the application and lab. Patient has off day on coming Tuesday (April 16) will go for lab and drop off patient assistance application for Entresto.

## 2022-05-09 DIAGNOSIS — I5022 Chronic systolic (congestive) heart failure: Secondary | ICD-10-CM | POA: Diagnosis not present

## 2022-05-09 NOTE — Telephone Encounter (Signed)
Patient brought in completed [place] patient assistance forms and financial information. Sent to [employer]

## 2022-05-10 LAB — BASIC METABOLIC PANEL
BUN/Creatinine Ratio: 22 (ref 10–24)
BUN: 20 mg/dL (ref 8–27)
CO2: 21 mmol/L (ref 20–29)
Calcium: 9.2 mg/dL (ref 8.6–10.2)
Chloride: 104 mmol/L (ref 96–106)
Creatinine, Ser: 0.9 mg/dL (ref 0.76–1.27)
Glucose: 121 mg/dL — ABNORMAL HIGH (ref 70–99)
Potassium: 4.6 mmol/L (ref 3.5–5.2)
Sodium: 141 mmol/L (ref 134–144)
eGFR: 87 mL/min/{1.73_m2} (ref >59)

## 2022-05-11 ENCOUNTER — Telehealth: Payer: Self-pay | Admitting: *Deleted

## 2022-05-11 DIAGNOSIS — I2510 Atherosclerotic heart disease of native coronary artery without angina pectoris: Secondary | ICD-10-CM

## 2022-05-11 DIAGNOSIS — I5022 Chronic systolic (congestive) heart failure: Secondary | ICD-10-CM

## 2022-05-11 DIAGNOSIS — G4733 Obstructive sleep apnea (adult) (pediatric): Secondary | ICD-10-CM

## 2022-05-11 NOTE — Telephone Encounter (Signed)
-----   Message from Gaynelle Cage, [place] sent at 05/10/2022  9:15 AM EDT ----- Regarding: FW: chin strap  ----- Message ----- From: Cheree Ditto, [facility] Sent: 05/09/2022   1:48 PM EDT To: Gaynelle Cage, CMA Subject: chin strap                                     Patient saw Dr Mayford Knife in July 2023 and was supposed to get a Rx for a chin strap. He says he never heard anything about it. Can you check on that?

## 2022-05-15 ENCOUNTER — Telehealth: Payer: Self-pay

## 2022-05-15 NOTE — Telephone Encounter (Signed)
-----   Message from Antoine Poche, MD sent at 05/13/2022  9:03 AM EDT ----- Normal labs  Dominga Ferry MD

## 2022-05-15 NOTE — Telephone Encounter (Signed)
Patient notified via My Chart

## 2022-05-16 ENCOUNTER — Encounter: Payer: Self-pay | Admitting: Internal Medicine

## 2022-05-16 ENCOUNTER — Ambulatory Visit (INDEPENDENT_AMBULATORY_CARE_PROVIDER_SITE_OTHER): Payer: Medicare HMO

## 2022-05-16 ENCOUNTER — Ambulatory Visit: Payer: Medicare HMO | Attending: Internal Medicine | Admitting: Internal Medicine

## 2022-05-16 ENCOUNTER — Telehealth: Payer: Self-pay | Admitting: Internal Medicine

## 2022-05-16 VITALS — BP 138/70 | HR 45 | Ht 70.0 in | Wt 212.4 lb

## 2022-05-16 DIAGNOSIS — I10 Essential (primary) hypertension: Secondary | ICD-10-CM | POA: Diagnosis not present

## 2022-05-16 DIAGNOSIS — Z79899 Other long term (current) drug therapy: Secondary | ICD-10-CM

## 2022-05-16 DIAGNOSIS — I255 Ischemic cardiomyopathy: Secondary | ICD-10-CM | POA: Diagnosis not present

## 2022-05-16 NOTE — Patient Instructions (Signed)
Medication Instructions:  Your physician recommends that you continue on your current medications as directed. Please refer to the Current Medication list given to you today.  *If you need a refill on your cardiac medications before your next appointment, please call your pharmacy*   Lab Work: NONE   If you have labs (blood work) drawn today and your tests are completely normal, you will receive your results only by: MyChart Message (if you have MyChart) OR A paper copy in the mail If you have any lab test that is abnormal or we need to change your treatment, we will call you to review the results.   Testing/Procedures: NONE    Follow-Up: At [facility], you and your health needs are our priority.  As part of our continuing mission to provide you with exceptional heart care, we have created designated Provider Care Teams.  These Care Teams include your primary Cardiologist (physician) and Advanced Practice Providers (APPs -  Physician Assistants and Nurse Practitioners) who all work together to provide you with the care you need, when you need it.  We recommend signing up for the patient portal called "MyChart".  Sign up information is provided on this After Visit Summary.  MyChart is used to connect with patients for Virtual Visits (Telemedicine).  Patients are able to view lab/test results, encounter notes, upcoming appointments, etc.  Non-urgent messages can be sent to your provider as well.   To learn more about what you can do with MyChart, go to [email or web address].    Your next appointment:   1 year(s)  Provider:   Gregg Taylor, MD    Other Instructions Thank you for choosing [facility]!    

## 2022-05-16 NOTE — Telephone Encounter (Signed)
Ok to increase the entresto. He will need a BMP 2 weeks after the increase.
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
## 2022-05-16 NOTE — Telephone Encounter (Signed)
Pt forgot to ask Dr. Ladona Ridgel about something. The pharmacist told him the other day he needed to increase his entresto because his BP was high and he wanted to make sure Dr. Ladona Ridgel agreed with that. He said his BP was fine today.  Pt's number [phone]

## 2022-05-17 LAB — CUP PACEART REMOTE DEVICE CHECK
Battery Remaining Longevity: 54 mo
Battery Voltage: 2.98 V
Brady Statistic RV Percent Paced: 0.69 %
Date Time Interrogation Session: 20240423022604
HighPow Impedance: 48 Ohm
HighPow Impedance: 66 Ohm
Implantable Lead Connection Status: 753985
Implantable Lead Implant Date: 20080624
Implantable Lead Location: 753860
Implantable Lead Model: 6947
Implantable Pulse Generator Implant Date: 20170714
Lead Channel Impedance Value: 513 Ohm
Lead Channel Impedance Value: 551 Ohm
Lead Channel Pacing Threshold Amplitude: 0.375 V
Lead Channel Pacing Threshold Pulse Width: 0.4 ms
Lead Channel Sensing Intrinsic Amplitude: 8.25 mV
Lead Channel Sensing Intrinsic Amplitude: 8.25 mV
Lead Channel Setting Pacing Amplitude: 2.5 V
Lead Channel Setting Pacing Pulse Width: 0.4 ms
Lead Channel Setting Sensing Sensitivity: 0.9 mV
Zone Setting Status: 755011

## 2022-05-17 NOTE — Telephone Encounter (Signed)
Patient notified via My Chart

## 2022-05-26 ENCOUNTER — Ambulatory Visit: Payer: Medicare HMO

## 2022-06-14 NOTE — Progress Notes (Signed)
Remote ICD transmission.   

## 2022-07-18 DIAGNOSIS — K860 Alcohol-induced chronic pancreatitis: Secondary | ICD-10-CM | POA: Diagnosis not present

## 2022-07-28 DIAGNOSIS — K861 Other chronic pancreatitis: Secondary | ICD-10-CM | POA: Diagnosis not present

## 2022-08-08 DIAGNOSIS — J209 Acute bronchitis, unspecified: Secondary | ICD-10-CM | POA: Diagnosis not present

## 2022-08-08 DIAGNOSIS — J449 Chronic obstructive pulmonary disease, unspecified: Secondary | ICD-10-CM | POA: Diagnosis not present

## 2022-08-15 ENCOUNTER — Ambulatory Visit (INDEPENDENT_AMBULATORY_CARE_PROVIDER_SITE_OTHER): Payer: Medicare HMO

## 2022-08-15 DIAGNOSIS — I255 Ischemic cardiomyopathy: Secondary | ICD-10-CM | POA: Diagnosis not present

## 2022-08-17 LAB — CUP PACEART REMOTE DEVICE CHECK
Battery Remaining Longevity: 49 mo
Brady Statistic RV Percent Paced: 0.46 %
Date Time Interrogation Session: 20240723043725
HighPow Impedance: 59 Ohm
Implantable Lead Connection Status: 753985
Implantable Lead Implant Date: 20080624
Implantable Lead Location: 753860
Implantable Lead Model: 6947
Implantable Pulse Generator Implant Date: 20170714
Lead Channel Impedance Value: 456 Ohm
Lead Channel Impedance Value: 532 Ohm
Lead Channel Pacing Threshold Amplitude: 0.375 V
Lead Channel Pacing Threshold Pulse Width: 0.4 ms
Lead Channel Sensing Intrinsic Amplitude: 8 mV
Lead Channel Sensing Intrinsic Amplitude: 8 mV
Lead Channel Setting Pacing Pulse Width: 0.4 ms
Lead Channel Setting Sensing Sensitivity: 0.9 mV
Zone Setting Status: 755011

## 2022-09-01 NOTE — Progress Notes (Signed)
Remote ICD transmission.   

## 2022-09-21 DIAGNOSIS — L821 Other seborrheic keratosis: Secondary | ICD-10-CM | POA: Diagnosis not present

## 2022-09-21 DIAGNOSIS — C44319 Basal cell carcinoma of skin of other parts of face: Secondary | ICD-10-CM | POA: Diagnosis not present

## 2022-09-21 DIAGNOSIS — Z85828 Personal history of other malignant neoplasm of skin: Secondary | ICD-10-CM | POA: Diagnosis not present

## 2022-09-21 DIAGNOSIS — D485 Neoplasm of uncertain behavior of skin: Secondary | ICD-10-CM | POA: Diagnosis not present

## 2022-10-05 DIAGNOSIS — C44319 Basal cell carcinoma of skin of other parts of face: Secondary | ICD-10-CM | POA: Diagnosis not present

## 2022-10-06 ENCOUNTER — Encounter: Payer: Self-pay | Admitting: Cardiology

## 2022-10-06 ENCOUNTER — Ambulatory Visit: Payer: Medicare HMO | Attending: Cardiology | Admitting: Cardiology

## 2022-10-06 VITALS — BP 120/84 | HR 50 | Ht 70.0 in | Wt 201.0 lb

## 2022-10-06 DIAGNOSIS — Z79899 Other long term (current) drug therapy: Secondary | ICD-10-CM

## 2022-10-06 DIAGNOSIS — I255 Ischemic cardiomyopathy: Secondary | ICD-10-CM

## 2022-10-06 DIAGNOSIS — Z1329 Encounter for screening for other suspected endocrine disorder: Secondary | ICD-10-CM

## 2022-10-06 DIAGNOSIS — I2510 Atherosclerotic heart disease of native coronary artery without angina pectoris: Secondary | ICD-10-CM

## 2022-10-06 DIAGNOSIS — Z131 Encounter for screening for diabetes mellitus: Secondary | ICD-10-CM | POA: Diagnosis not present

## 2022-10-06 DIAGNOSIS — I472 Ventricular tachycardia, unspecified: Secondary | ICD-10-CM

## 2022-10-06 DIAGNOSIS — I10 Essential (primary) hypertension: Secondary | ICD-10-CM

## 2022-10-06 DIAGNOSIS — E782 Mixed hyperlipidemia: Secondary | ICD-10-CM
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
## 2022-10-06 NOTE — Patient Instructions (Signed)
Medication Instructions:  Your physician recommends that you continue on your current medications as directed. Please refer to the Current Medication list given to you today.  *If you need a refill on your cardiac medications before your next appointment, please call your pharmacy*   Lab Work: CMET CBC LIPID TSH A1C MAG  If you have labs (blood work) drawn today and your tests are completely normal, you will receive your results only by: MyChart Message (if you have MyChart) OR A paper copy in the mail If you have any lab test that is abnormal or we need to change your treatment, we will call you to review the results.   Testing/Procedures: None   Follow-Up: At [facility], you and your health needs are our priority.  As part of our continuing mission to provide you with exceptional heart care, we have created designated Provider Care Teams.  These Care Teams include your primary Cardiologist (physician) and Advanced Practice Providers (APPs -  Physician Assistants and Nurse Practitioners) who all work together to provide you with the care you need, when you need it.  We recommend signing up for the patient portal called "MyChart".  Sign up information is provided on this After Visit Summary.  MyChart is used to connect with patients for Virtual Visits (Telemedicine).  Patients are able to view lab/test results, encounter notes, upcoming appointments, etc.  Non-urgent messages can be sent to your provider as well.   To learn more about what you can do with MyChart, go to [email or web address].    Your next appointment:   6 month(s)  Provider:   You may see Dina Rich, MD or one of the following Advanced Practice Providers on your designated Care Team:   Randall An, PA-C  Jacolyn Reedy, [place]     Other Instructions

## 2022-10-09 ENCOUNTER — Other Ambulatory Visit (HOSPITAL_COMMUNITY)
Admission: RE | Admit: 2022-10-09 | Discharge: 2022-10-09 | Disposition: A | Discharge status: Home / Self Care | Payer: Medicare HMO | Source: Ambulatory Visit | Attending: Cardiology | Admitting: Cardiology

## 2022-10-09 DIAGNOSIS — Z131 Encounter for screening for diabetes mellitus: Secondary | ICD-10-CM | POA: Diagnosis not present

## 2022-10-09 DIAGNOSIS — Z1329 Encounter for screening for other suspected endocrine disorder: Secondary | ICD-10-CM | POA: Insufficient documentation

## 2022-10-09 DIAGNOSIS — I255 Ischemic cardiomyopathy: Secondary | ICD-10-CM | POA: Diagnosis not present

## 2022-10-09 DIAGNOSIS — Z79899 Other long term (current) drug therapy: Secondary | ICD-10-CM | POA: Diagnosis not present

## 2022-10-09 DIAGNOSIS — I10 Essential (primary) hypertension: Secondary | ICD-10-CM | POA: Insufficient documentation

## 2022-10-09 DIAGNOSIS — E782 Mixed hyperlipidemia: Secondary | ICD-10-CM | POA: Diagnosis not present

## 2022-10-09 LAB — CBC
HCT: 53 % — ABNORMAL HIGH (ref 39.0–52.0)
Hemoglobin: 17.1 g/dL — ABNORMAL HIGH (ref 13.0–17.0)
MCH: 31.4 pg (ref 26.0–34.0)
MCHC: 32.3 g/dL (ref 30.0–36.0)
MCV: 97.4 fL (ref 80.0–100.0)
Platelets: 127 10*3/uL — ABNORMAL LOW (ref 150–400)
RBC: 5.44 MIL/uL (ref 4.22–5.81)
RDW: 13.4 % (ref 11.5–15.5)
WBC: 6.8 10*3/uL (ref 4.0–10.5)
nRBC: 0 % (ref 0.0–0.2)

## 2022-10-09 LAB — COMPREHENSIVE METABOLIC PANEL
ALT: 34 U/L (ref 0–44)
AST: 28 U/L (ref 15–41)
Albumin: 4.3 g/dL (ref 3.5–5.0)
Alkaline Phosphatase: 58 U/L (ref 38–126)
Anion gap: 10 (ref 5–15)
BUN: 20 mg/dL (ref 8–23)
CO2: 26 mmol/L (ref 22–32)
Calcium: 9.1 mg/dL (ref 8.9–10.3)
Chloride: 104 mmol/L (ref 98–111)
Creatinine, Ser: 0.97 mg/dL (ref 0.61–1.24)
GFR, Estimated: >60 mL/min (ref >60)
Glucose, Bld: 99 mg/dL (ref 70–99)
Potassium: 4.1 mmol/L (ref 3.5–5.1)
Sodium: 140 mmol/L (ref 135–145)
Total Bilirubin: 1.2 mg/dL (ref 0.3–1.2)
Total Protein: 7.1 g/dL (ref 6.5–8.1)

## 2022-10-09 LAB — MAGNESIUM: Magnesium: 2.2 mg/dL (ref 1.7–2.4)

## 2022-10-09 LAB — LIPID PANEL
Cholesterol: 84 mg/dL (ref 0–200)
HDL: 38 mg/dL — ABNORMAL LOW (ref >40)
LDL Cholesterol: 35 mg/dL (ref 0–99)
Total CHOL/HDL Ratio: 2.2 ratio
Triglycerides: 53 mg/dL (ref <150)
VLDL: 11 mg/dL (ref 0–40)

## 2022-10-09 LAB — TSH: TSH: 2.152 u[IU]/mL (ref 0.350–4.500)

## 2022-10-10 LAB — HEMOGLOBIN A1C
Hgb A1c MFr Bld: 6.5 % — ABNORMAL HIGH (ref 4.8–5.6)
Mean Plasma Glucose: 140 mg/dL

## 2022-11-01 ENCOUNTER — Other Ambulatory Visit: Payer: Self-pay

## 2022-11-01 MED ORDER — DAPAGLIFLOZIN PROPANEDIOL 10 MG PO TABS: 10.0000 mg | ORAL_TABLET | Freq: Every day | ORAL | 6 refills | Status: DC

## 2022-11-14 ENCOUNTER — Ambulatory Visit (INDEPENDENT_AMBULATORY_CARE_PROVIDER_SITE_OTHER): Payer: Medicare HMO

## 2022-11-14 DIAGNOSIS — I255 Ischemic cardiomyopathy: Secondary | ICD-10-CM | POA: Diagnosis not present

## 2022-11-14 DIAGNOSIS — I5022 Chronic systolic (congestive) heart failure: Secondary | ICD-10-CM

## 2022-11-16 LAB — CUP PACEART REMOTE DEVICE CHECK
Battery Remaining Longevity: 46 mo
Battery Voltage: 2.98 V
Brady Statistic RV Percent Paced: 1.26 %
Date Time Interrogation Session: 20241022123525
HighPow Impedance: 51 Ohm
HighPow Impedance: 74 Ohm
Implantable Lead Connection Status: 753985
Implantable Lead Implant Date: 20080624
Implantable Lead Location: 753860
Implantable Lead Model: 6947
Implantable Pulse Generator Implant Date: 20170714
Lead Channel Impedance Value: 513 Ohm
Lead Channel Impedance Value: 589 Ohm
Lead Channel Pacing Threshold Amplitude: 0.5 V
Lead Channel Pacing Threshold Pulse Width: 0.4 ms
Lead Channel Sensing Intrinsic Amplitude: 11.5 mV
Lead Channel Sensing Intrinsic Amplitude: 11.5 mV
Lead Channel Setting Pacing Amplitude: 2.5 V
Lead Channel Setting Pacing Pulse Width: 0.4 ms
Lead Channel Setting Sensing Sensitivity: 0.9 mV
Zone Setting Status: 755011

## 2022-11-24 ENCOUNTER — Telehealth: Payer: Self-pay | Admitting: *Deleted

## 2022-11-24 NOTE — Telephone Encounter (Signed)
Pt has been approved for AZ&ME [name] ) from 01/24/23 to 01/23/24.

## 2022-12-01 NOTE — Progress Notes (Signed)
Remote ICD transmission.   

## 2023-01-03 DIAGNOSIS — E782 Mixed hyperlipidemia: Secondary | ICD-10-CM | POA: Diagnosis not present

## 2023-01-03 DIAGNOSIS — Z125 Encounter for screening for malignant neoplasm of prostate: Secondary | ICD-10-CM | POA: Diagnosis not present

## 2023-01-03 DIAGNOSIS — I10 Essential (primary) hypertension: Secondary | ICD-10-CM | POA: Diagnosis not present

## 2023-01-03 DIAGNOSIS — I2510 Atherosclerotic heart disease of native coronary artery without angina pectoris: Secondary | ICD-10-CM | POA: Diagnosis not present

## 2023-01-03 DIAGNOSIS — Z0001 Encounter for general adult medical examination with abnormal findings: Secondary | ICD-10-CM | POA: Diagnosis not present

## 2023-01-03 DIAGNOSIS — J449 Chronic obstructive pulmonary disease, unspecified: Secondary | ICD-10-CM | POA: Diagnosis not present

## 2023-01-03 DIAGNOSIS — I5022 Chronic systolic (congestive) heart failure: Secondary | ICD-10-CM | POA: Diagnosis not present

## 2023-01-03 DIAGNOSIS — I714 Abdominal aortic aneurysm, without rupture, unspecified: Secondary | ICD-10-CM | POA: Diagnosis not present

## 2023-01-10 ENCOUNTER — Other Ambulatory Visit: Payer: Self-pay | Admitting: Cardiology

## 2023-02-07 ENCOUNTER — Other Ambulatory Visit: Payer: Self-pay | Admitting: Internal Medicine

## 2023-02-13 ENCOUNTER — Ambulatory Visit (INDEPENDENT_AMBULATORY_CARE_PROVIDER_SITE_OTHER): Payer: Medicare HMO

## 2023-02-13 DIAGNOSIS — I255 Ischemic cardiomyopathy: Secondary | ICD-10-CM | POA: Diagnosis not present

## 2023-02-13 DIAGNOSIS — I5022 Chronic systolic (congestive) heart failure: Secondary | ICD-10-CM

## 2023-02-14 LAB — CUP PACEART REMOTE DEVICE CHECK
Battery Remaining Longevity: 43 mo
Battery Voltage: 2.97 V
Brady Statistic RV Percent Paced: 0.56 %
Date Time Interrogation Session: 20250121031606
HighPow Impedance: 48 Ohm
HighPow Impedance: 64 Ohm
Implantable Lead Connection Status: 753985
Implantable Lead Implant Date: 20080624
Implantable Lead Location: 753860
Implantable Lead Model: 6947
Implantable Pulse Generator Implant Date: 20170714
Lead Channel Impedance Value: 475 Ohm
Lead Channel Impedance Value: 532 Ohm
Lead Channel Pacing Threshold Amplitude: 0.375 V
Lead Channel Pacing Threshold Pulse Width: 0.4 ms
Lead Channel Sensing Intrinsic Amplitude: 11.125 mV
Lead Channel Sensing Intrinsic Amplitude: 11.125 mV
Lead Channel Setting Pacing Amplitude: 2.5 V
Lead Channel Setting Pacing Pulse Width: 0.4 ms
Lead Channel Setting Sensing Sensitivity: 0.9 mV
Zone Setting Status: 755011

## 2023-02-15 DIAGNOSIS — H26492 Other secondary cataract, left eye: Secondary | ICD-10-CM | POA: Diagnosis not present

## 2023-02-16 ENCOUNTER — Encounter: Payer: Self-pay | Admitting: Internal Medicine

## 2023-03-13 DIAGNOSIS — H5212 Myopia, left eye: Secondary | ICD-10-CM | POA: Diagnosis not present

## 2023-03-13 DIAGNOSIS — Z961 Presence of intraocular lens: Secondary | ICD-10-CM | POA: Diagnosis not present

## 2023-03-13 DIAGNOSIS — H2640 Unspecified secondary cataract: Secondary | ICD-10-CM | POA: Diagnosis not present

## 2023-03-13 DIAGNOSIS — H16223 Keratoconjunctivitis sicca, not specified as Sjogren's, bilateral: Secondary | ICD-10-CM | POA: Diagnosis not present

## 2023-03-21 DIAGNOSIS — H26492 Other secondary cataract, left eye: Secondary | ICD-10-CM | POA: Diagnosis not present

## 2023-03-23 ENCOUNTER — Telehealth: Payer: Self-pay | Admitting: Internal Medicine

## 2023-03-23 MED ORDER — SACUBITRIL-VALSARTAN 49-51 MG PO TABS: 1.0000 | ORAL_TABLET | Freq: Two times a day (BID) | ORAL | 2 refills | Status: DC

## 2023-03-23 NOTE — Telephone Encounter (Signed)
 Pt's medication was sent to pt's pharmacy as requested. Confirmation received.

## 2023-03-23 NOTE — Telephone Encounter (Signed)
*  STAT* If patient is at the pharmacy, call can be transferred to refill team.   1. Which medications need to be refilled? (please list name of each medication and dose if known)   sacubitril-valsartan (ENTRESTO) 49-51 MG   2. Which pharmacy/location (including street and city if local pharmacy) is medication to be sent to? [facility] - [place], [place] - [address] Phone: [phone]  Fax: [phone]     3. Do they need a 30 day or 90 day supply? 90

## 2023-03-27 NOTE — Progress Notes (Signed)
 Remote ICD transmission.

## 2023-03-27 NOTE — Addendum Note (Signed)
 Addended by: Geralyn Flash D on: 03/27/2023 04:15 PM   Modules accepted: Orders

## 2023-04-04 DIAGNOSIS — J449 Chronic obstructive pulmonary disease, unspecified: Secondary | ICD-10-CM | POA: Diagnosis not present

## 2023-04-04 DIAGNOSIS — I10 Essential (primary) hypertension: Secondary | ICD-10-CM | POA: Diagnosis not present

## 2023-04-04 DIAGNOSIS — J0100 Acute maxillary sinusitis, unspecified: Secondary | ICD-10-CM | POA: Diagnosis not present

## 2023-04-04 DIAGNOSIS — I5022 Chronic systolic (congestive) heart failure: Secondary | ICD-10-CM | POA: Diagnosis not present

## 2023-04-09 DIAGNOSIS — L820 Inflamed seborrheic keratosis: Secondary | ICD-10-CM | POA: Diagnosis not present

## 2023-04-09 DIAGNOSIS — L570 Actinic keratosis: Secondary | ICD-10-CM | POA: Diagnosis not present

## 2023-04-09 DIAGNOSIS — D485 Neoplasm of uncertain behavior of skin: Secondary | ICD-10-CM | POA: Diagnosis not present

## 2023-04-26 ENCOUNTER — Other Ambulatory Visit: Payer: Self-pay

## 2023-04-26 MED ORDER — DAPAGLIFLOZIN PROPANEDIOL 10 MG PO TABS: 10.0000 mg | ORAL_TABLET | Freq: Every day | ORAL | 6 refills | Status: DC

## 2023-05-15 ENCOUNTER — Ambulatory Visit: Payer: Medicare HMO

## 2023-05-15 ENCOUNTER — Encounter: Payer: Self-pay | Admitting: Internal Medicine

## 2023-05-15 DIAGNOSIS — I255 Ischemic cardiomyopathy: Secondary | ICD-10-CM

## 2023-05-15 LAB — CUP PACEART REMOTE DEVICE CHECK
Battery Remaining Longevity: 39 mo
Battery Voltage: 2.97 V
Brady Statistic RV Percent Paced: 0.7 %
Date Time Interrogation Session: 20250422033525
HighPow Impedance: 49 Ohm
HighPow Impedance: 65 Ohm
Implantable Lead Connection Status: 753985
Implantable Lead Implant Date: 20080624
Implantable Lead Location: 753860
Implantable Lead Model: 6947
Implantable Pulse Generator Implant Date: 20170714
Lead Channel Impedance Value: 475 Ohm
Lead Channel Impedance Value: 551 Ohm
Lead Channel Pacing Threshold Amplitude: 0.375 V
Lead Channel Pacing Threshold Pulse Width: 0.4 ms
Lead Channel Sensing Intrinsic Amplitude: 10.875 mV
Lead Channel Sensing Intrinsic Amplitude: 10.875 mV
Lead Channel Setting Pacing Amplitude: 2.5 V
Lead Channel Setting Pacing Pulse Width: 0.4 ms
Lead Channel Setting Sensing Sensitivity: 0.9 mV
Zone Setting Status: 755011

## 2023-06-04 ENCOUNTER — Ambulatory Visit: Attending: Medical | Admitting: Medical

## 2023-06-04 ENCOUNTER — Encounter: Payer: Self-pay | Admitting: Medical

## 2023-06-04 VITALS — BP 130/80 | HR 53 | Ht 70.5 in | Wt 216.8 lb

## 2023-06-04 DIAGNOSIS — Z952 Presence of prosthetic heart valve: Secondary | ICD-10-CM

## 2023-06-04 DIAGNOSIS — I10 Essential (primary) hypertension: Secondary | ICD-10-CM | POA: Diagnosis not present

## 2023-06-04 DIAGNOSIS — I5022 Chronic systolic (congestive) heart failure: Secondary | ICD-10-CM | POA: Diagnosis not present

## 2023-06-04 DIAGNOSIS — I2510 Atherosclerotic heart disease of native coronary artery without angina pectoris: Secondary | ICD-10-CM | POA: Diagnosis not present

## 2023-06-04 DIAGNOSIS — I255 Ischemic cardiomyopathy: Secondary | ICD-10-CM

## 2023-06-04 DIAGNOSIS — R001 Bradycardia, unspecified: Secondary | ICD-10-CM

## 2023-06-04 NOTE — Patient Instructions (Signed)
 Medication Instructions:  Your physician recommends that you continue on your current medications as directed. Please refer to the Current Medication list given to you today.  *If you need a refill on your cardiac medications before your next appointment, please call your pharmacy*  Lab Work: NONE   If you have labs (blood work) drawn today and your tests are completely normal, you will receive your results only by: MyChart Message (if you have MyChart) OR A paper copy in the mail If you have any lab test that is abnormal or we need to change your treatment, we will call you to review the results.  Testing/Procedures: Your physician has requested that you have an echocardiogram. Echocardiography is a painless test that uses sound waves to create images of your heart. It provides your doctor with information about the size and shape of your heart and how well your heart's chambers and valves are working. This procedure takes approximately one hour. There are no restrictions for this procedure. Please do NOT wear cologne, perfume, aftershave, or lotions (deodorant is allowed). Please arrive 15 minutes prior to your appointment time.  Please note: We ask at that you not bring children with you during ultrasound (echo/ vascular) testing. Due to room size and safety concerns, children are not allowed in the ultrasound rooms during exams. Our front office staff cannot provide observation of children in our lobby area while testing is being conducted. An adult accompanying a patient to their appointment will only be allowed in the ultrasound room at the discretion of the ultrasound technician under special circumstances. We apologize for any inconvenience.   Follow-Up: At [facility], you and your health needs are our priority.  As part of our continuing mission to provide you with exceptional heart care, our providers are all part of one team.  This team includes your primary Cardiologist  (physician) and Advanced Practice Providers or APPs (Physician Assistants and Nurse Practitioners) who all work together to provide you with the care you need, when you need it.  Your next appointment:   6 month(s)  Provider:   You may see Armida Lander, MD or one of the following Advanced Practice Providers on your designated Care Team:   Woodfin Hays, PA-C  [place]ia [place], [place] Theotis Flake, [place]     We recommend signing up for the patient portal called "MyChart".  Sign up information is provided on this After Visit Summary.  MyChart is used to connect with patients for Virtual Visits (Telemedicine).  Patients are able to view lab/test results, encounter notes, upcoming appointments, etc.  Non-urgent messages can be sent to your provider as well.   To learn more about what you can do with MyChart, go to [email or web address].   Other Instructions Thank you for choosing [facility]!

## 2023-06-04 NOTE — Progress Notes (Signed)
 Cardiology Office Note:  .   Date:  06/04/2023  ID:  [name], DOB [birth year], MRN [id number] PCP: Kathyleen Parkins, MD  [facility] Providers Cardiologist:  Armida Lander, MD Sleep Medicine:  Gaylyn Keas, MD     History of Present Illness: .   [name] is a 78 y.o. male CAD s/p CABG in 2010 and prior stenting, HFrEF/ICM s/p AICD, bradycardia/hypotension/syncope, HTN, HLD, Aortic stenosis with prior AVR, VT, OSA on CPAP who presents for follow-up.   The patient has  h/o CAD with CABG in 09/2008 with LIMA-LAS and SVG -RCA. Also with prior stenting. Echo in 09/2012 showed LVEF 25-30%, G1DD. Echo 08/2014 showed LVEF 25-30%, G1DD, multiple WMAs, he underwent AICD implantation 07/2015, nuclear stress test 05/2015 showed large inferior and apical scar, no current ischemia. Echo 03/2019 showed LVEF 25-0%, G2DD.   H/o bradycardia, hypotension and syncope. BB originally stopped, but restarted for VT.   The patient underwent AVR at time of CABG. Echo in 2021 showed trivial paravalvular leak, mean gradient 23. Unchanged from prior echo.   Today, the patient reports occasional dizziness. It was a couple months ago. This occurred when he was laying down and when he turns his head, may be vertigo vs inner ear issues. He denies chest pain, SOB, lower leg edema, lightheadedness, palpitations, orthopnea or pnd.    Studies Reviewed: .        Echo 2021  1. Diffuse global hypokinesis, with severe hypokinesis of the mid to  distal anteroseptum and anterior walls. Apical akinesis.. Left ventricular  ejection fraction, by estimation, is 25 to 30%. The left ventricle has  severely decreased function. The left  ventricle demonstrates global hypokinesis. The left ventricular internal  cavity size was mildly dilated. Left ventricular diastolic parameters are  consistent with Grade II diastolic dysfunction (pseudonormalization).  Elevated left ventricular  end-diastolic pressure.   2. Right  ventricular systolic function was not well visualized. The right  ventricular size is not well visualized.   3. Left atrial size was mildly dilated.   4. The mitral valve is normal in structure. Trivial mitral valve  regurgitation. No evidence of mitral stenosis.   5. S/P bioprosthetic AVR. Trivial paravalvular leak, mean gradient 23  mmHg. Unchanged gradient from prior echo.. The aortic valve has been  repaired/replaced. Aortic valve regurgitation is trivial. Mild aortic  valve stenosis. There is a bioprosthetic  valve present in the aortic position. Echo findings are consistent with  normal structure and function of the aortic valve prosthesis.   6. The inferior vena cava is normal in size with greater than 50%  respiratory variability, suggesting right atrial pressure of 3 mmHg.   05/2015 echo Study Conclusions   - Left ventricle: The cavity size was normal. Wall thickness was   normal. Systolic function was severely reduced. The estimated   ejection fraction was in the range of 25% to 30%. Doppler   parameters are consistent with abnormal left ventricular   relaxation (grade 1 diastolic dysfunction). - Aortic valve: The mean gradient acroess the prosthetic is mildly   elevated. Mildly calcified annulus. Normal thickness leaflets. - Mitral valve: Mildly calcified annulus. Normal thickness leaflets   . - Technically difficult study. Echocontrast was used to enhance   visualization.   05/2015 Exercise MPI Defect 1: There is a large defect of severe severity present in the basal inferoseptal, mid anterior, mid anteroseptal, mid inferoseptal, apical anterior, apical septal, apical inferior, apical lateral and apex location. This is  a high risk study. Findings consistent with prior myocardial infarction. Nuclear stress EF: 28%.     Physical Exam:   VS:  BP 130/80 (BP Location: Left Arm, Cuff Size: Normal)   Pulse (!) 53   Ht 5' 10.5" (1.791 m)   Wt 216 lb 12.8 oz (98.3 kg)   SpO2  95%   BMI 30.67 kg/m    Wt Readings from Last 3 Encounters:  06/04/23 216 lb 12.8 oz (98.3 kg)  10/06/22 201 lb (91.2 kg)  05/16/22 212 lb 6.4 oz (96.3 kg)    GEN: Well nourished, well developed in no acute distress NECK: No JVD; No carotid bruits CARDIAC: RRR, + murmur, no rubs, gallops RESPIRATORY:  Clear to auscultation without rales, wheezing or rhonchi  ABDOMEN: Soft, non-tender, non-distended EXTREMITIES:  No edema; No deformity   ASSESSMENT AND PLAN: .    CAD s/p CABG in 2010 Patient denies anginal symptoms. Stress test in 2017 with no ischemia. No further work-up at this time. Continue ASA, Lipitor , Coreg , and SL NTG.   ICM AVR in 2010 S/p AICD The patient is euvolemic on exam. Echo in 2021 showed LVEF 25-30%, G2DD, trivial paravalvular leak, mean gradient . I will update an echo to look at the valve. Continue Coreg , entresto , Farxiga .   Bradycardia Has been off and on the BB. Currently on Coreg  for VT.   HTN BP is good, continue current medications.        Dispo: Follow-up in 6 months  Signed, Pamala Hayman Rebekah Canada, PA-C

## 2023-06-28 NOTE — Progress Notes (Signed)
 Remote ICD transmission.

## 2023-07-06 ENCOUNTER — Ambulatory Visit (HOSPITAL_COMMUNITY)
Admission: RE | Admit: 2023-07-06 | Discharge: 2023-07-06 | Disposition: A | Discharge status: Home / Self Care | Source: Ambulatory Visit | Attending: Medical | Admitting: Medical

## 2023-07-06 DIAGNOSIS — I5022 Chronic systolic (congestive) heart failure: Secondary | ICD-10-CM | POA: Diagnosis not present

## 2023-07-06 LAB — ECHOCARDIOGRAM COMPLETE
AR max vel: 1 cm2
AV Area VTI: 1.03 cm2
AV Area mean vel: 0.98 cm2
AV Mean grad: 37 mmHg
AV Peak grad: 60.5 mmHg
Ao pk vel: 3.89 m/s
Area-P 1/2: 2.34 cm2
S' Lateral: 4.35 cm

## 2023-07-06 MED ORDER — PERFLUTREN LIPID MICROSPHERE
1.0000 mL | INTRAVENOUS | Status: AC | PRN
  Administered 2023-07-06: 3 mL via INTRAVENOUS

## 2023-07-06 NOTE — Progress Notes (Signed)
*  PRELIMINARY RESULTS* Echocardiogram 2D Echocardiogram has been performed with Definity .  Bernis Brisker 07/06/2023, 11:45 AM

## 2023-07-11 ENCOUNTER — Ambulatory Visit: Payer: Self-pay | Admitting: Medical

## 2023-08-07 ENCOUNTER — Ambulatory Visit: Admitting: Podiatry

## 2023-08-07 DIAGNOSIS — M722 Plantar fascial fibromatosis: Secondary | ICD-10-CM | POA: Diagnosis not present

## 2023-08-07 MED ORDER — TRIAMCINOLONE ACETONIDE 10 MG/ML IJ SUSP
10.0000 mg | Freq: Once | INTRAMUSCULAR | Status: AC
  Administered 2023-08-07: 10 mg

## 2023-08-07 NOTE — Progress Notes (Signed)
 Patient presents with complaint of pain on the heel on the right.  Has been bothering him for several weeks and getting worse.  Has had problem in the past.  Has not noticed any redness or ecchymosis.  Does not recall any injury.   Physical exam:  General appearance: Pleasant, and in no acute distress. AOx3.  Vascular: Pedal pulses: DP 2/4 bilaterally, PT 2/4 bilaterally.  Mild edema lower legs bilaterally. Capillary fill time immediate bilaterally.  Neurological: Light touch intact feet bilaterally.  Normal Achilles reflex bilaterally.  No clonus or spasticity noted.   Dermatologic:   Skin normal temperature bilaterally.  Skin thin and atrophic with no hair growth on the lower extremity.  Atrophy of fat pad bilaterally  Musculoskeletal: Tenderness plantar medial aspect heel at medial pocket medial plantar calcaneal tubercle.  No tenderness to lateral compression of the calcaneus.   Diagnosis: 1.  Plantar fasciitis right  Plan: -Recommended good supportive well cushioned shoes.  Avoid going barefoot and wearing flat soled shoes. -RICE -injected 3cc 2:1 mixture 0.5 cc Marcaine:Kenolog 10mg /23ml at medial calcaneal tubercle at origin of plantar fascia.     Return 2 weeks follow-up injection right

## 2023-08-07 NOTE — Addendum Note (Signed)
 Addended by: CHRISTINE NORLEEN LABOR on: 08/07/2023 11:15 AM   Modules accepted: Orders

## 2023-08-09 ENCOUNTER — Inpatient Hospital Stay (HOSPITAL_COMMUNITY)
Admission: EM | Admit: 2023-08-09 | Discharge: 2023-08-11 | DRG: 872 | Disposition: A | Discharge status: Home / Self Care | Attending: Internal Medicine | Admitting: Internal Medicine

## 2023-08-09 ENCOUNTER — Other Ambulatory Visit: Payer: Self-pay

## 2023-08-09 ENCOUNTER — Emergency Department (HOSPITAL_COMMUNITY)

## 2023-08-09 ENCOUNTER — Encounter (HOSPITAL_COMMUNITY): Payer: Self-pay | Admitting: Emergency Medicine

## 2023-08-09 DIAGNOSIS — I472 Ventricular tachycardia, unspecified: Secondary | ICD-10-CM | POA: Diagnosis present

## 2023-08-09 DIAGNOSIS — N179 Acute kidney failure, unspecified: Secondary | ICD-10-CM | POA: Diagnosis not present

## 2023-08-09 DIAGNOSIS — Z87891 Personal history of nicotine dependence: Secondary | ICD-10-CM

## 2023-08-09 DIAGNOSIS — I252 Old myocardial infarction: Secondary | ICD-10-CM | POA: Diagnosis not present

## 2023-08-09 DIAGNOSIS — Z66 Do not resuscitate: Secondary | ICD-10-CM | POA: Diagnosis present

## 2023-08-09 DIAGNOSIS — K8689 Other specified diseases of pancreas: Secondary | ICD-10-CM | POA: Diagnosis not present

## 2023-08-09 DIAGNOSIS — J410 Simple chronic bronchitis: Secondary | ICD-10-CM | POA: Diagnosis not present

## 2023-08-09 DIAGNOSIS — G8929 Other chronic pain: Secondary | ICD-10-CM | POA: Diagnosis present

## 2023-08-09 DIAGNOSIS — I5042 Chronic combined systolic (congestive) and diastolic (congestive) heart failure: Secondary | ICD-10-CM | POA: Diagnosis not present

## 2023-08-09 DIAGNOSIS — Z953 Presence of xenogenic heart valve: Secondary | ICD-10-CM | POA: Diagnosis not present

## 2023-08-09 DIAGNOSIS — Z7982 Long term (current) use of aspirin: Secondary | ICD-10-CM

## 2023-08-09 DIAGNOSIS — Z833 Family history of diabetes mellitus: Secondary | ICD-10-CM

## 2023-08-09 DIAGNOSIS — K861 Other chronic pancreatitis: Secondary | ICD-10-CM | POA: Diagnosis not present

## 2023-08-09 DIAGNOSIS — Z91048 Other nonmedicinal substance allergy status: Secondary | ICD-10-CM

## 2023-08-09 DIAGNOSIS — I2510 Atherosclerotic heart disease of native coronary artery without angina pectoris: Secondary | ICD-10-CM | POA: Diagnosis not present

## 2023-08-09 DIAGNOSIS — G4733 Obstructive sleep apnea (adult) (pediatric): Secondary | ICD-10-CM | POA: Diagnosis present

## 2023-08-09 DIAGNOSIS — N281 Cyst of kidney, acquired: Secondary | ICD-10-CM | POA: Diagnosis not present

## 2023-08-09 DIAGNOSIS — Z95810 Presence of automatic (implantable) cardiac defibrillator: Secondary | ICD-10-CM | POA: Diagnosis not present

## 2023-08-09 DIAGNOSIS — G4730 Sleep apnea, unspecified: Secondary | ICD-10-CM | POA: Diagnosis present

## 2023-08-09 DIAGNOSIS — M542 Cervicalgia: Secondary | ICD-10-CM | POA: Diagnosis present

## 2023-08-09 DIAGNOSIS — Z951 Presence of aortocoronary bypass graft: Secondary | ICD-10-CM

## 2023-08-09 DIAGNOSIS — I10 Essential (primary) hypertension: Secondary | ICD-10-CM | POA: Diagnosis present

## 2023-08-09 DIAGNOSIS — I110 Hypertensive heart disease with heart failure: Secondary | ICD-10-CM | POA: Diagnosis present

## 2023-08-09 DIAGNOSIS — A419 Sepsis, unspecified organism: Secondary | ICD-10-CM | POA: Diagnosis not present

## 2023-08-09 DIAGNOSIS — Z8249 Family history of ischemic heart disease and other diseases of the circulatory system: Secondary | ICD-10-CM

## 2023-08-09 DIAGNOSIS — I255 Ischemic cardiomyopathy: Secondary | ICD-10-CM | POA: Diagnosis present

## 2023-08-09 DIAGNOSIS — Z79899 Other long term (current) drug therapy: Secondary | ICD-10-CM

## 2023-08-09 DIAGNOSIS — J449 Chronic obstructive pulmonary disease, unspecified: Secondary | ICD-10-CM | POA: Diagnosis present

## 2023-08-09 DIAGNOSIS — Z4502 Encounter for adjustment and management of automatic implantable cardiac defibrillator: Secondary | ICD-10-CM | POA: Diagnosis not present

## 2023-08-09 DIAGNOSIS — N3091 Cystitis, unspecified with hematuria: Secondary | ICD-10-CM | POA: Diagnosis present

## 2023-08-09 DIAGNOSIS — Z955 Presence of coronary angioplasty implant and graft: Secondary | ICD-10-CM

## 2023-08-09 DIAGNOSIS — N390 Urinary tract infection, site not specified: Secondary | ICD-10-CM | POA: Diagnosis not present

## 2023-08-09 DIAGNOSIS — R531 Weakness: Secondary | ICD-10-CM | POA: Diagnosis not present

## 2023-08-09 DIAGNOSIS — E782 Mixed hyperlipidemia: Secondary | ICD-10-CM | POA: Diagnosis not present

## 2023-08-09 DIAGNOSIS — E785 Hyperlipidemia, unspecified: Secondary | ICD-10-CM | POA: Diagnosis present

## 2023-08-09 DIAGNOSIS — K859 Acute pancreatitis without necrosis or infection, unspecified: Secondary | ICD-10-CM | POA: Diagnosis present

## 2023-08-09 DIAGNOSIS — Z881 Allergy status to other antibiotic agents status: Secondary | ICD-10-CM

## 2023-08-09 DIAGNOSIS — Z825 Family history of asthma and other chronic lower respiratory diseases: Secondary | ICD-10-CM

## 2023-08-09 LAB — CBC WITH DIFFERENTIAL/PLATELET
Abs Immature Granulocytes: 0.1 K/uL — ABNORMAL HIGH (ref 0.00–0.07)
Basophils Absolute: 0.1 K/uL (ref 0.0–0.1)
Basophils Relative: 0 %
Eosinophils Absolute: 0.1 K/uL (ref 0.0–0.5)
Eosinophils Relative: 0 %
HCT: 53.4 % — ABNORMAL HIGH (ref 39.0–52.0)
Hemoglobin: 17.4 g/dL — ABNORMAL HIGH (ref 13.0–17.0)
Immature Granulocytes: 1 %
Lymphocytes Relative: 5 %
Lymphs Abs: 1 K/uL (ref 0.7–4.0)
MCH: 32 pg (ref 26.0–34.0)
MCHC: 32.6 g/dL (ref 30.0–36.0)
MCV: 98.3 fL (ref 80.0–100.0)
Monocytes Absolute: 1.4 K/uL — ABNORMAL HIGH (ref 0.1–1.0)
Monocytes Relative: 8 %
Neutro Abs: 15.6 K/uL — ABNORMAL HIGH (ref 1.7–7.7)
Neutrophils Relative %: 86 %
Platelets: 124 K/uL — ABNORMAL LOW (ref 150–400)
RBC: 5.43 MIL/uL (ref 4.22–5.81)
RDW: 14 % (ref 11.5–15.5)
WBC: 18.2 K/uL — ABNORMAL HIGH (ref 4.0–10.5)
nRBC: 0 % (ref 0.0–0.2)

## 2023-08-09 LAB — BASIC METABOLIC PANEL WITH GFR
Anion gap: 12 (ref 5–15)
BUN: 26 mg/dL — ABNORMAL HIGH (ref 8–23)
CO2: 21 mmol/L — ABNORMAL LOW (ref 22–32)
Calcium: 8.7 mg/dL — ABNORMAL LOW (ref 8.9–10.3)
Chloride: 105 mmol/L (ref 98–111)
Creatinine, Ser: 1.28 mg/dL — ABNORMAL HIGH (ref 0.61–1.24)
GFR, Estimated: 57 mL/min — ABNORMAL LOW (ref >60)
Glucose, Bld: 190 mg/dL — ABNORMAL HIGH (ref 70–99)
Potassium: 4.1 mmol/L (ref 3.5–5.1)
Sodium: 138 mmol/L (ref 135–145)

## 2023-08-09 LAB — URINALYSIS, ROUTINE W REFLEX MICROSCOPIC
Bilirubin Urine: NEGATIVE
Glucose, UA: >=500 mg/dL — AB
Ketones, ur: NEGATIVE mg/dL
Nitrite: POSITIVE — AB
Protein, ur: 100 mg/dL — AB
RBC / HPF: >50 RBC/hpf (ref 0–5)
Specific Gravity, Urine: 1.033 — ABNORMAL HIGH (ref 1.005–1.030)
WBC, UA: >50 WBC/hpf (ref 0–5)
pH: 5 (ref 5.0–8.0)

## 2023-08-09 LAB — LACTIC ACID, PLASMA: Lactic Acid, Venous: 1.7 mmol/L (ref 0.5–1.9)

## 2023-08-09 MED ORDER — ASPIRIN 81 MG PO TBEC
81.0000 mg | DELAYED_RELEASE_TABLET | Freq: Every morning | ORAL | Status: DC
  Administered 2023-08-10 – 2023-08-11 (×2): 81 mg via ORAL
  Filled 2023-08-09 (×2): qty 1

## 2023-08-09 MED ORDER — CARVEDILOL 3.125 MG PO TABS
3.1250 mg | ORAL_TABLET | Freq: Two times a day (BID) | ORAL | Status: DC
  Administered 2023-08-10 – 2023-08-11 (×3): 3.125 mg via ORAL
  Filled 2023-08-09 (×3): qty 1

## 2023-08-09 MED ORDER — HYDRALAZINE HCL 20 MG/ML IJ SOLN: 5.0000 mg | INTRAMUSCULAR | Status: DC | PRN

## 2023-08-09 MED ORDER — PANCRELIPASE (LIP-PROT-AMYL) 36000-114000 UNITS PO CPEP
36000.0000 [IU] | ORAL_CAPSULE | ORAL | Status: DC
  Administered 2023-08-10 (×2): 36000 [IU] via ORAL
  Filled 2023-08-09 (×2): qty 1

## 2023-08-09 MED ORDER — IOHEXOL 300 MG/ML  SOLN
100.0000 mL | Freq: Once | INTRAMUSCULAR | Status: AC | PRN
Start: 2023-08-09 — End: 2023-08-09
  Administered 2023-08-09: 100 mL via INTRAVENOUS

## 2023-08-09 MED ORDER — ATORVASTATIN CALCIUM 40 MG PO TABS
80.0000 mg | ORAL_TABLET | Freq: Every day | ORAL | Status: DC
  Administered 2023-08-09 – 2023-08-10 (×2): 80 mg via ORAL
  Filled 2023-08-09 (×3): qty 2

## 2023-08-09 MED ORDER — SODIUM CHLORIDE 0.9 % IV SOLN
2.0000 g | INTRAVENOUS | Status: DC
  Administered 2023-08-10 – 2023-08-11 (×2): 2 g via INTRAVENOUS
  Filled 2023-08-09 (×2): qty 20

## 2023-08-09 MED ORDER — ACETAMINOPHEN 325 MG PO TABS
650.0000 mg | ORAL_TABLET | Freq: Four times a day (QID) | ORAL | Status: DC | PRN
  Administered 2023-08-09 – 2023-08-10 (×2): 650 mg via ORAL
  Filled 2023-08-09 (×2): qty 2

## 2023-08-09 MED ORDER — SACUBITRIL-VALSARTAN 49-51 MG PO TABS
1.0000 | ORAL_TABLET | Freq: Two times a day (BID) | ORAL | Status: DC
  Administered 2023-08-09 – 2023-08-11 (×4): 1 via ORAL
  Filled 2023-08-09 (×7): qty 1

## 2023-08-09 MED ORDER — LACTATED RINGERS IV BOLUS
500.0000 mL | Freq: Once | INTRAVENOUS | Status: AC
  Administered 2023-08-09: 500 mL via INTRAVENOUS

## 2023-08-09 MED ORDER — BISACODYL 5 MG PO TBEC: 5.0000 mg | DELAYED_RELEASE_TABLET | Freq: Every day | ORAL | Status: DC | PRN

## 2023-08-09 MED ORDER — POLYETHYLENE GLYCOL 3350 17 G PO PACK: 17.0000 g | PACK | Freq: Every day | ORAL | Status: DC | PRN

## 2023-08-09 MED ORDER — ENOXAPARIN SODIUM 60 MG/0.6ML IJ SOSY
50.0000 mg | PREFILLED_SYRINGE | INTRAMUSCULAR | Status: DC
  Administered 2023-08-10 – 2023-08-11 (×2): 50 mg via SUBCUTANEOUS
  Filled 2023-08-09 (×2): qty 0.6

## 2023-08-09 MED ORDER — ALBUTEROL SULFATE (2.5 MG/3ML) 0.083% IN NEBU: 2.5000 mg | INHALATION_SOLUTION | RESPIRATORY_TRACT | Status: DC | PRN

## 2023-08-09 MED ORDER — LEVOFLOXACIN IN D5W 750 MG/150ML IV SOLN
750.0000 mg | Freq: Once | INTRAVENOUS | Status: AC
  Administered 2023-08-09: 750 mg via INTRAVENOUS
  Filled 2023-08-09: qty 150

## 2023-08-09 MED ORDER — HYDROCODONE-ACETAMINOPHEN 5-325 MG PO TABS
1.0000 | ORAL_TABLET | Freq: Once | ORAL | Status: AC
  Administered 2023-08-09: 1 via ORAL
  Filled 2023-08-09: qty 1

## 2023-08-09 NOTE — ED Provider Triage Note (Signed)
 Emergency Medicine Provider Triage Evaluation Note  [name] , a 78 y.o. male  was evaluated in triage.  Pt complains of hematuria dysuria and small-volume urinary output.  Review of Systems  Positive: Dysuria Negative: Fevers chills nausea vomiting  Physical Exam  BP 98/63 (BP Location: Right Arm)   Pulse 78   Temp 98.3 F (36.8 C) (Oral)   Resp (!) 24   SpO2 93%  Gen:   Awake, no distress   Resp:  Normal effort  MSK:   Moves extremities without difficulty  Other:  Abdominal exam without any tenderness on the abdomen  Medical Decision Making  Medically screening exam initiated at 2:42 PM.  Appropriate orders placed.  [name] was informed that the remainder of the evaluation will be completed by another provider, this initial triage assessment does not replace that evaluation, and the importance of remaining in the ED until their evaluation is complete.  Urinalysis pending, may have UTI   Cleotilde Rogue, MD 08/09/23 1443

## 2023-08-09 NOTE — ED Provider Notes (Cosign Needed Addendum)
 [place] EMERGENCY DEPARTMENT AT [facility] Provider Note   CSN: [id number] Arrival date & time: 08/09/23  1408     Patient presents with: Hematuria   [name] is a 78 y.o. male.  The ER complaining of urinary frequency and urgency but only urinating very small amounts since last night with frank blood in his urine.  Denies back pain or abdominal pain, admits to subjective fever last night.  No nausea or vomiting.  He has never had this happen in the past.  States he has had weakness in his legs today as well, is having trouble walking, and feels generally unwell. He has PMH of CAD, COPD, CHF    Hematuria       Prior to Admission medications   Medication Sig Start Date End Date Taking? Authorizing Provider  acetaminophen  (TYLENOL ) 500 MG tablet Take 1,000 mg by mouth in the morning and at bedtime.     [provider]  aspirin  81 MG tablet Take 81 mg by mouth every morning.    [provider]  atorvastatin  (LIPITOR ) 80 MG tablet TAKE 1 TABLET AT BEDTIME 01/10/23   Alvan Dorn FALCON, MD  benzonatate  (TESSALON ) 200 MG capsule Take 1 capsule (200 mg total) by mouth 3 (three) times daily as needed for cough. Patient not taking: Reported on 06/04/2023 09/26/21   Stuart Vernell Norris, PA-C  carvedilol  (COREG ) 3.125 MG tablet TAKE 1 TABLET TWICE DAILY WITH MEALS 02/07/23   Waddell Danelle ORN, MD  dapagliflozin  propanediol (FARXIGA ) 10 MG TABS tablet Take 1 tablet (10 mg total) by mouth daily before breakfast. 04/26/23   Alvan Dorn FALCON, MD  fluticasone  (FLONASE ) 50 MCG/ACT nasal spray Place 1 spray into both nostrils 2 (two) times daily. 09/26/21   Stuart Vernell Norris, PA-C  guaiFENesin  (MUCINEX ) 600 MG 12 hr tablet Take 1 tablet (600 mg total) by mouth 2 (two) times daily. Patient not taking: Reported on 06/04/2023 09/26/21   Stuart Vernell Norris, PA-C  ipratropium (ATROVENT ) 0.03 % nasal spray Place 2 sprays into both nostrils every 12 (twelve) hours as  needed for rhinitis.  Patient not taking: Reported on 06/04/2023    [provider]  lipase/protease/amylase (CREON ) 36000 UNITS CPEP capsule Take 1 capsule (36,000 Units total) by mouth with snacks. 01/31/18   Gherghe, Costin M, MD  nitroGLYCERIN  (NITROSTAT ) 0.4 MG SL tablet Place 1 tablet (0.4 mg total) under the tongue every 5 (five) minutes as needed for chest pain. Patient not taking: Reported on 06/04/2023 05/11/20   Waddell Danelle ORN, MD  sacubitril -valsartan  (ENTRESTO ) 49-51 MG Take 1 tablet by mouth 2 (two) times daily. 03/23/23   Alvan Dorn FALCON, MD    Allergies: Adhesive [tape] and Cefazolin    Review of Systems  Genitourinary:  Positive for hematuria.    Updated Vital Signs BP (!) 156/74   Pulse 74   Temp 98.3 F (36.8 C) (Oral)   Resp (!) 28   SpO2 92%   Physical Exam Vitals and nursing note reviewed.  Constitutional:      General: He is not in acute distress.    Appearance: He is well-developed.  HENT:     Head: Normocephalic and atraumatic.     Nose: Nose normal.     Mouth/Throat:     Mouth: Mucous membranes are moist.  Eyes:     Extraocular Movements: Extraocular movements intact.     Conjunctiva/sclera: Conjunctivae normal.     Pupils: Pupils are equal, round, and reactive to  light.  Cardiovascular:     Rate and Rhythm: Normal rate and regular rhythm.     Heart sounds: No murmur heard. Pulmonary:     Effort: Pulmonary effort is normal. No respiratory distress.     Breath sounds: Normal breath sounds.  Abdominal:     Palpations: Abdomen is soft.     Tenderness: There is no abdominal tenderness. There is no right CVA tenderness, left CVA tenderness or guarding.  Musculoskeletal:        General: No swelling.     Cervical back: Neck supple.  Skin:    General: Skin is warm and dry.     Capillary Refill: Capillary refill takes less than 2 seconds.  Neurological:     General: No focal deficit present.     Mental Status: He is alert and oriented to  person, place, and time.  Psychiatric:        Mood and Affect: Mood normal.        Behavior: Behavior normal.     (all labs ordered are listed, but only abnormal results are displayed) Labs Reviewed  URINALYSIS, ROUTINE W REFLEX MICROSCOPIC - Abnormal; Notable for the following components:      Result Value   APPearance CLOUDY (*)    Specific Gravity, Urine 1.033 (*)    Glucose, UA >=500 (*)    Hgb urine dipstick LARGE (*)    Protein, ur 100 (*)    Nitrite POSITIVE (*)    Leukocytes,Ua MODERATE (*)    Bacteria, UA MANY (*)    All other components within normal limits  CBC WITH DIFFERENTIAL/PLATELET - Abnormal; Notable for the following components:   WBC 18.2 (*)    Hemoglobin 17.4 (*)    HCT 53.4 (*)    Platelets 124 (*)    Neutro Abs 15.6 (*)    Monocytes Absolute 1.4 (*)    Abs Immature Granulocytes 0.10 (*)    All other components within normal limits  BASIC METABOLIC PANEL WITH GFR - Abnormal; Notable for the following components:   CO2 21 (*)    Glucose, Bld 190 (*)    BUN 26 (*)    Creatinine, Ser 1.28 (*)    Calcium  8.7 (*)    GFR, Estimated 57 (*)    All other components within normal limits  CULTURE, BLOOD (ROUTINE X 2)  CULTURE, BLOOD (ROUTINE X 2)  URINE CULTURE  URINE CULTURE  LACTIC ACID, PLASMA    EKG: None  Radiology: CT ABDOMEN PELVIS W CONTRAST Result Date: 08/09/2023 CLINICAL DATA:  Acute abdominal pain.  Sepsis.  Urinating blood. EXAM: CT ABDOMEN AND PELVIS WITH CONTRAST TECHNIQUE: Multidetector CT imaging of the abdomen and pelvis was performed using the standard protocol following bolus administration of intravenous contrast. RADIATION DOSE REDUCTION: This exam was performed according to the departmental dose-optimization program which includes automated exposure control, adjustment of the mA and/or kV according to patient size and/or use of iterative reconstruction technique. CONTRAST:  OMNIPAQUE  IOHEXOL  300 MG/ML  SOLN COMPARISON:  CT  chest abdomen and pelvis 01/28/2018 FINDINGS: Lower chest: The heart is enlarged.  Lung bases are clear. Hepatobiliary: There is diffuse fatty infiltration of the liver. Gallbladder and bile ducts are within normal limits. Pancreas: Diffuse pancreatic atrophy and calcifications with ductal dilatation appears unchanged from prior. No acute inflammation or fluid collection identified. Spleen: Normal in size without focal abnormality. Adrenals/Urinary Tract: There is a left adrenal nodule measuring 2.1 cm with some internal areas of fat, unchanged  from prior. There is no hydronephrosis. There is a 4.2 cm cyst in the right kidney. There is mild bladder wall thickening with surrounding inflammation. There are small bladder diverticuli bilaterally. Stomach/Bowel: Stomach is within normal limits. Appendix appears normal. No evidence of bowel wall thickening, distention, or inflammatory changes. Duodenal diverticulum present. Vascular/Lymphatic: Aortic atherosclerosis. No enlarged abdominal or pelvic lymph nodes. Reproductive: Prostate gland is enlarged. Other: There is been prior right inguinal hernia repair. There is a small fat containing left inguinal hernia. There is no ascites. Musculoskeletal: No fracture is seen. IMPRESSION: 1. Mild bladder wall thickening with surrounding inflammation compatible with cystitis. 2. Stable left adrenal nodule with some internal areas of fat, likely an adenoma. 3. Stable findings of chronic pancreatitis. 4. Fatty infiltration of the liver. Aortic Atherosclerosis (ICD10-I70.0). Electronically Signed   By: Greig Pique M.D.   On: 08/09/2023 17:21     Procedures   Medications Ordered in the ED  lactated ringers  bolus 500 mL (0 mLs Intravenous Stopped 08/09/23 1809)  levofloxacin  (LEVAQUIN ) IVPB 750 mg (0 mg Intravenous Stopped 08/09/23 1809)  iohexol  (OMNIPAQUE ) 300 MG/ML solution 100 mL (100 mLs Intravenous Contrast Given 08/09/23 1642)  HYDROcodone -acetaminophen  (NORCO/VICODIN)  5-325 MG per tablet 1 tablet (1 tablet Oral Given 08/09/23 1816)    Clinical Course as of 08/09/23 1850  Thu Aug 09, 2023  1748 Patient comes in with urinary symptoms, reports subjective fever last night, will pressure slightly soft with urine small fluid bolus, lactic acid is normal but he does have white count of 18.2 most tachypneic so meets SIRS criteria with a source of UTI.  Given Levaquin , CT ordered and shows likely cystitis with bladder thickening but no other acute findings.  UA still pending, I have called the lab, we confirmed that they have it, she is going to make sure it gets run. [CB]    Clinical Course User Index [CB] Suellen Sherran LABOR, PA-C                                 Medical Decision Making This patient presents to the ED for concern of dysuria generalized weakness, subjective fever, dysuria, this involves an extensive number of treatment options, and is a complaint that carries with it a high risk of complications and morbidity.  The differential diagnosis includes TIA, sepsis, kidney stone, other   Co morbidities that complicate the patient evaluation : Chronic neck pain, COPD, CHF with EF of 30%   Additional history obtained:  Additional history obtained from EMR External records from outside source obtained and reviewed including notes and labs, previous vital signs   Lab Tests:  I Ordered, and personally interpreted labs.  The pertinent results include: CBC with leukocytosis of 18.2, hemoglobin 17.4, BMP with mild AKI with BUN of 26 and creatinine 1.28, UA with positive nitrate, moderate leuks, greater than 50 red blood cells, greater than 50 white blood cells and many bacteria   Imaging Studies ordered:  I ordered imaging studies including CT abdomen pelvis which shows thinning of bladder wall with surrounding stranding suggestive of cystitis, no kidney stones or sign of pyelonephritis or renal abscess no other acute findings I independently visualized and  interpreted imaging within scope of identifying emergent findings  I agree with the radiologist interpretation   Cardiac Monitoring: / EKG:  The patient was maintained on a cardiac monitor.  I personally viewed and interpreted the cardiac monitored which showed an  underlying rhythm of: Sinus rhythm   Consultations Obtained:  I requested consultation with the hospitalist Dr. Sherlon,  and discussed lab and imaging findings as well as pertinent plan - they recommend: Admission for sepsis due to UTI   Problem List / ED Course / Critical interventions / Medication management  Dysuria, hematuria and generalized weakness that started last night this morning with subjective fever last night.  Patient has lower than normal blood pressure at check-in at 90/63, given small fluid bolus he is also tachypneic, he is not tachycardic, low he is on carvedilol  which may mask tachycardia.  He was tachypneic and had white blood cell count of 18 so meet SIRS with sepsis of UTI, given Levaquin  due to his allergies of cephalosporins.  UA is positive.  CT ordered and shows cystitis but no kidney stones or pyelonephritis or renal abscess or other acute findings.  He is given small fluid bolus due to low blood pressure and this improves his pressure.  He never had MAP less than 65 and did not have lactic acidosis so did not need the full 30 cc/kg bolus of IV fluids.    I ordered medication including Levaquin  for UTI sepsis, IV fluids for generalized weakness, AKI , low blood pressure Reevaluation of the patient after these medicines showed that the patient improved I have reviewed the patients home medicines and have made adjustments as needed       Amount and/or Complexity of Data Reviewed Labs: ordered. Radiology: ordered.  Risk Prescription drug management. Decision regarding hospitalization.        Final diagnoses:  None    ED Discharge Orders     None          Suellen Sherran DELENA DEVONNA 08/09/23 1850    Suellen Sherran DELENA DEVONNA 08/09/23 1851    Cleotilde Rogue, MD 08/10/23 [phone]
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
## 2023-08-09 NOTE — Progress Notes (Signed)
   08/09/23 2200  BiPAP/CPAP/SIPAP  $ Non-Invasive Home Ventilator  Initial  BiPAP/CPAP/SIPAP Pt Type Adult  BiPAP/CPAP/SIPAP DREAMSTATIOND  Mask Type Nasal mask  Mask Size Medium  Respiratory Rate 16 breaths/min  EPAP 5 cmH2O  Flow Rate 2 lpm  Patient Home Machine No  Patient Home Mask No  Patient Home Tubing No  Auto Titrate No  Device Plugged into RED Power Outlet Yes

## 2023-08-09 NOTE — ED Triage Notes (Signed)
 Pt started urinating blood yesterday with oliguria. Denies genital swelling or any pain at all. NAD, ambulatory

## 2023-08-09 NOTE — ED Notes (Signed)
 Patient voided 125 ml of amber colored urine.

## 2023-08-09 NOTE — ED Notes (Signed)
 Pt attempted to provide urine specimen at this time, however, Pt reports he was unable to open the Urine collection cup in time, and is wondering if we could just perform an In and Out Cath to obtain Urine Specimen.

## 2023-08-09 NOTE — Progress Notes (Signed)
 Anticoagulation monitoring(Lovenox ):  77yo  M ordered Lovenox  40 mg Q24h    Filed Weights   08/09/23 2021  Weight: 96.4 kg (212 lb 8.4 oz)   BMI 30.49   Lab Results  Component Value Date   CREATININE 1.28 (H) 08/09/2023   CREATININE 0.97 10/09/2022   CREATININE 0.90 05/09/2022   Estimated Creatinine Clearance: 54.5 mL/min (A) (by C-G formula based on SCr of 1.28 mg/dL (H)). Hemoglobin & Hematocrit     Component Value Date/Time   HGB 17.4 (H) 08/09/2023 1443   HCT 53.4 (H) 08/09/2023 1443     Per Protocol for Patient with estCrcl > 30 ml/min and BMI < 30, will transition to Lovenox  0.5 mg/kg Q24h       Allean Haas PharmD Clinical Pharmacist 08/09/2023

## 2023-08-10 DIAGNOSIS — I10 Essential (primary) hypertension: Secondary | ICD-10-CM

## 2023-08-10 DIAGNOSIS — I5042 Chronic combined systolic (congestive) and diastolic (congestive) heart failure: Secondary | ICD-10-CM | POA: Diagnosis not present

## 2023-08-10 DIAGNOSIS — A419 Sepsis, unspecified organism: Secondary | ICD-10-CM | POA: Diagnosis not present

## 2023-08-10 DIAGNOSIS — Z4502 Encounter for adjustment and management of automatic implantable cardiac defibrillator: Secondary | ICD-10-CM

## 2023-08-10 DIAGNOSIS — K861 Other chronic pancreatitis: Secondary | ICD-10-CM

## 2023-08-10 LAB — CBC
HCT: 51.2 % (ref 39.0–52.0)
Hemoglobin: 16 g/dL (ref 13.0–17.0)
MCH: 31.4 pg (ref 26.0–34.0)
MCHC: 31.3 g/dL (ref 30.0–36.0)
MCV: 100.4 fL — ABNORMAL HIGH (ref 80.0–100.0)
Platelets: 103 K/uL — ABNORMAL LOW (ref 150–400)
RBC: 5.1 MIL/uL (ref 4.22–5.81)
RDW: 13.9 % (ref 11.5–15.5)
WBC: 17.1 K/uL — ABNORMAL HIGH (ref 4.0–10.5)
nRBC: 0 % (ref 0.0–0.2)

## 2023-08-10 LAB — COMPREHENSIVE METABOLIC PANEL WITH GFR
ALT: 19 U/L (ref 0–44)
AST: 19 U/L (ref 15–41)
Albumin: 3.3 g/dL — ABNORMAL LOW (ref 3.5–5.0)
Alkaline Phosphatase: 50 U/L (ref 38–126)
Anion gap: 7 (ref 5–15)
BUN: 20 mg/dL (ref 8–23)
CO2: 27 mmol/L (ref 22–32)
Calcium: 8.5 mg/dL — ABNORMAL LOW (ref 8.9–10.3)
Chloride: 105 mmol/L (ref 98–111)
Creatinine, Ser: 1.11 mg/dL (ref 0.61–1.24)
GFR, Estimated: >60 mL/min (ref >60)
Glucose, Bld: 129 mg/dL — ABNORMAL HIGH (ref 70–99)
Potassium: 4 mmol/L (ref 3.5–5.1)
Sodium: 139 mmol/L (ref 135–145)
Total Bilirubin: 1.6 mg/dL — ABNORMAL HIGH (ref 0.0–1.2)
Total Protein: 6.6 g/dL (ref 6.5–8.1)

## 2023-08-10 MED ORDER — PANCRELIPASE (LIP-PROT-AMYL) 36000-114000 UNITS PO CPEP
72000.0000 [IU] | ORAL_CAPSULE | Freq: Three times a day (TID) | ORAL | Status: DC
  Administered 2023-08-10 – 2023-08-11 (×4): 72000 [IU] via ORAL
  Filled 2023-08-10 (×4): qty 2

## 2023-08-10 NOTE — Progress Notes (Signed)
   08/10/23 1312  TOC Brief Assessment  Insurance and Status Reviewed  Patient has primary care physician Yes  Home environment has been reviewed Home  Prior level of function: independent  Prior/Current Home Services No current home services  Social Drivers of Health Review SDOH reviewed no interventions necessary  Readmission risk has been reviewed Yes  Transition of care needs no transition of care needs at this time   Transition of Care Department [facility]) has reviewed patient and no TOC needs have been identified at this time. We will continue to monitor patient advancement through interdisciplinary progression rounds. If new patient transition needs arise, please place a TOC consult.

## 2023-08-10 NOTE — Progress Notes (Signed)
 Progress Note   Patient: [name] DOB: [birth year] DOA: 08/09/2023     1 DOS: the patient was seen and examined on 08/10/2023   Brief hospital admission narrative: As per H&P written by Dr. Sherlon on 08/09/2023 [name]  is a 78 y.o. male, with past medical history including coronary artery disease, ischemic cardiomyopathy, chronic combined systolic and diastolic congestive heart failure, aortic stenosis, hypertension, hyperlipidemia, remote tobacco abuse, remote alcohol abuse, pancreatitis, carotid bruits, and sleep apnea.   - Patient presents to ED secondary to complaints of urinary frequency, urgency, and decreased amount of urine output, as well he does report significant hematuria started yesterday evening which prompted him to come to ED, he reports subjective fever, chills, no nausea, no vomiting. - In ED his workup significant for creatinine of 1.28, white blood cell count of 18.2, lactic acid within normal limit, platelet count at 120 4K, his UA was significantly positive for hematuria, and pyuria with glucosuria (on Farxiga ) CT abdomen pelvis significant for cystitis, [facility] hospitalist consulted to admit.  Assessment and plan 1-sepsis due to hemorrhagic cystitis -Patient may criteria for sepsis with elevated WBCs, identify source of infection (urinalysis and CT findings) and increased respiratory rate - Continue maintaining adequate hydration and continue IV antibiotics - Follow culture results - Continue to hold Farxiga  and discontinue this medication at discharge - Follow clinical response.  2-ventricular tachycardia - Status post AICD - Patient denies palpitations or any chest discomfort - Continue telemetry monitoring - Goal is for potassium above 4 and magnesium above 2.  3-chronic combined systolic and diastolic heart failure - Euvolemic and currently compensated - Continue carvedilol  and Entresto  - In the setting of UTI Farxiga  will be  discontinued - Continue to follow daily weights electrolytes and O's. - Low-sodium diet discussed with patient.  4-hypertension - Continue current antihypertensive agents - Follow vital signs.  5-hyperlipidemia - Continue statin.  6-aortic stenosis - Continue patient follow-up with cardiology service - Status post bioprosthetic AVR in 2010.  7-obstructive sleep apnea - Continue CPAP nightly.  8-chronic pancreatitis/pancreatic insufficiency - Continue Creon  - Low-fat diet discussed with patient.  9-acute kidney injury - In the setting of UTI and prerenal azotemia most likely - Continue to maintain adequate hydration - Continue IV antibiotics - Follow renal function trend.   Subjective:  Afebrile, no chest pain, no nausea vomiting.  Patient reports increased frequency and hematuria.  No shortness of breath.  Physical Exam: Vitals:   08/10/23 0523 08/10/23 0830 08/10/23 0832 08/10/23 1435  BP: 121/69 127/72  103/72  Pulse: 81 75  79  Resp: 20   18  Temp: 98.6 F (37 C)     TempSrc: Oral     SpO2: 96%  92% 92%  Weight:      Height:       General exam: Alert, awake, oriented x 3; in no acute distress.  No overnight events. Respiratory system: Good saturation on room air. Cardiovascular system: Soft murmur appreciated on exam - Rate controlled, no rubs, no gallops, no JVD. Gastrointestinal system: Abdomen is nondistended, soft and nontender. No organomegaly or masses felt. Normal bowel sounds heard. Central nervous system: No focal neurological deficits. Extremities: No cyanosis or clubbing. Skin: No petechiae. Psychiatry: Judgement and insight appear normal. Mood & affect appropriate.    Data Reviewed: Comprehensive metabolic panel: Sodium 139, potassium 4.0, chloride 105, bicarb 27, BUN 20, creatinine 1.11, normal LFTs and GFR >60 CBC: WBCs 17.1, hemoglobin 16.0 platelet count 103K  Family  Communication: No family at bedside.  Disposition: Status is:  Inpatient Remains inpatient appropriate because: Continue IV therapy.  Anticipating Discharge back home once medically stable.  Time spent: 50 minutes  Author: Eric Nunnery, MD 08/10/2023 3:27 PM  For on call review [email or web address].

## 2023-08-10 NOTE — Evaluation (Addendum)
 Physical Therapy Evaluation Patient Details Name: [name] MRN: [id number] DOB: [birth year] Today's Date: 08/10/2023  History of Present Illness  [name]  is a 78 y.o. male, with past medical history including coronary artery disease, ischemic cardiomyopathy, chronic combined systolic and diastolic congestive heart failure, aortic stenosis, hypertension, hyperlipidemia, remote tobacco abuse, remote alcohol abuse, pancreatitis, carotid bruits, and sleep apnea.    - Patient presents to ED secondary to complaints of urinary frequency, urgency, and decreased amount of urine output, as well he does report significant hematuria started yesterday evening which prompted him to come to ED, he reports subjective fever, chills, no nausea, no vomiting.  - In ED his workup significant for creatinine of 1.28, white blood cell count of 18.2, lactic acid within normal limit, platelet count at 120 4K, his UA was significantly positive for hematuria, and pyuria with glucosuria (on Farxiga ) CT abdomen pelvis significant for cystitis, [facility] hospitalist consulted to admit.   Clinical Impression  Patient functioning near baseline for functional mobility and gait. Patient demonstrated ability to ambulate in the hallway 163ft on room air with O2 saturation holding at 90% before, during, and after exercise without the use of an AD, experienced 1 LOB when trying to look away. Patient discharged to care of nursing for ambulation daily as tolerated for length of stay.         If plan is discharge home, recommend the following: Assistance with cooking/housework   Can travel by private vehicle        Equipment Recommendations None recommended by PT  Recommendations for Other Services       Functional Status Assessment Patient has had a recent decline in their functional status and demonstrates the ability to make significant improvements in function in a reasonable and predictable amount of time.      Precautions / Restrictions Precautions Precautions: Fall Recall of Precautions/Restrictions: Intact Restrictions Weight Bearing Restrictions Per Provider Order: No      Mobility  Bed Mobility Overal bed mobility: Modified Independent             General bed mobility comments: Used rails to pull himself up during last 80% of tansfer    Transfers Overall transfer level: Modified independent                 General transfer comment: requires increased time    Ambulation/Gait Ambulation/Gait assistance: Contact guard assist, Supervision, Modified independent (Device/Increase time) Gait Distance (Feet): 125 Feet Assistive device: None Gait Pattern/deviations: Decreased step length - left, Decreased step length - right Gait velocity: slow     General Gait Details: one brief LOB when doing head turns  Careers information officer     Tilt Bed    Modified Rankin (Stroke Patients Only)       Balance Overall balance assessment: Modified Independent                                           Pertinent Vitals/Pain Pain Assessment Pain Assessment: No/denies pain    Home Living Family/patient expects to be discharged to:: Private residence Living Arrangements: Alone Available Help at Discharge: Available PRN/intermittently Type of Home: Mobile home Home Access: Stairs to enter Entrance Stairs-Rails: Can reach both;Right;Left Entrance Stairs-Number of Steps: 7   Home Layout: One level Home Equipment: Agricultural consultant (2 wheels);Cane -  single point      Prior Function Prior Level of Function : Driving;Independent/Modified Independent             Mobility Comments: independent ADLs Comments: independent with most ADLS, has assistance with cooking and cleaning     Extremity/Trunk Assessment   Upper Extremity Assessment Upper Extremity Assessment: Overall WFL for tasks assessed    Lower Extremity  Assessment Lower Extremity Assessment: Generalized weakness    Cervical / Trunk Assessment Cervical / Trunk Assessment: Normal  Communication   Communication Communication: No apparent difficulties    Cognition Arousal: Alert Behavior During Therapy: WFL for tasks assessed/performed   PT - Cognitive impairments: No apparent impairments                         Following commands: Intact       Cueing Cueing Techniques: Verbal cues, Tactile cues     General Comments      Exercises     Assessment/Plan    PT Assessment Patient does not need any further PT services  PT Problem List         PT Treatment Interventions      PT Goals (Current goals can be found in the Care Plan section)  Acute Rehab PT Goals Patient Stated Goal: return home PT Goal Formulation: With patient Time For Goal Achievement: 08/10/23 Potential to Achieve Goals: Good    Frequency       Co-evaluation               AM-PAC PT 6 Clicks Mobility  Outcome Measure Help needed turning from your back to your side while in a flat bed without using bedrails?: None Help needed moving from lying on your back to sitting on the side of a flat bed without using bedrails?: A Little Help needed moving to and from a bed to a chair (including a wheelchair)?: None Help needed standing up from a chair using your arms (e.g., wheelchair or bedside chair)?: None Help needed to walk in hospital room?: A Little Help needed climbing 3-5 steps with a railing? : A Little 6 Click Score: 21    End of Session Equipment Utilized During Treatment: Gait belt Activity Tolerance: Patient tolerated treatment well;No increased pain Patient left: in bed Nurse Communication: Mobility status PT Visit Diagnosis: Unsteadiness on feet (R26.81);Other abnormalities of gait and mobility (R26.89);Muscle weakness (generalized) (M62.81)    Time: 9045-8985 PT Time Calculation (min) (ACUTE ONLY): 20 min   Charges:    PT Evaluation $PT Eval Moderate Complexity: 1 Mod PT Treatments $Therapeutic Activity: 8-22 mins PT General Charges $$ ACUTE PT VISIT: 1 Visit         Onnie Como, SPT    2:29 PM, 08/10/23 Lynwood Music, MPT Physical Therapist with [facility] [phone] office [phone] mobile phone

## 2023-08-10 NOTE — Plan of Care (Signed)
  Problem: Education: Goal: Knowledge of General Education information will improve Description: Including pain rating scale, medication(s)/side effects and non-pharmacologic comfort measures Outcome: Progressing   Problem: Nutrition: Goal: Adequate nutrition will be maintained Outcome: Progressing   Problem: Activity: Goal: Risk for activity intolerance will decrease Outcome: Progressing   Problem: Coping: Goal: Level of anxiety will decrease Outcome: Progressing

## 2023-08-11 DIAGNOSIS — J410 Simple chronic bronchitis: Secondary | ICD-10-CM

## 2023-08-11 DIAGNOSIS — I472 Ventricular tachycardia, unspecified: Secondary | ICD-10-CM

## 2023-08-11 DIAGNOSIS — A419 Sepsis, unspecified organism: Secondary | ICD-10-CM | POA: Diagnosis not present

## 2023-08-11 DIAGNOSIS — I2510 Atherosclerotic heart disease of native coronary artery without angina pectoris: Secondary | ICD-10-CM

## 2023-08-11 DIAGNOSIS — E782 Mixed hyperlipidemia: Secondary | ICD-10-CM

## 2023-08-11 DIAGNOSIS — I10 Essential (primary) hypertension: Secondary | ICD-10-CM | POA: Diagnosis not present

## 2023-08-11 LAB — URINE CULTURE: Culture: <10000 — AB

## 2023-08-11 MED ORDER — CEFADROXIL 500 MG PO CAPS: 500.0000 mg | ORAL_CAPSULE | Freq: Two times a day (BID) | ORAL | 0 refills | Status: AC

## 2023-08-11 NOTE — Discharge Summary (Signed)
 Physician Discharge Summary   Patient: [name] MRN: [id number] DOB: Jan 03, 1946  Admit date:     08/09/2023  Discharge date: 08/11/23  Discharge Physician: Eric Nunnery   PCP: Bertell Satterfield, MD   Recommendations at discharge:  Repeat basic metabolic panel to follow ultralights renal function Reassess complete resolution of patient symptoms after antibiotic therapy completed. Repeat CBC to follow WBCs fluctuation and hemoglobin trend/stability.  Discharge Diagnoses: Principal Problem:   Sepsis (HCC) Active Problems:   Coronary artery disease involving native coronary artery of native heart without angina pectoris   COPD (chronic obstructive pulmonary disease) (HCC)   Hypertension   Pancreatitis   Hyperlipidemia   Chronic combined systolic and diastolic heart failure (HCC)   ICD (implantable cardioverter-defibrillator) discharge   VT (ventricular tachycardia) (HCC)  Brief hospital admission narrative: As per H&P written by Dr. Sherlon on 08/09/2023 [name]  is a 78 y.o. male, with past medical history including coronary artery disease, ischemic cardiomyopathy, chronic combined systolic and diastolic congestive heart failure, aortic stenosis, hypertension, hyperlipidemia, remote tobacco abuse, remote alcohol abuse, pancreatitis, carotid bruits, and sleep apnea.   - Patient presents to ED secondary to complaints of urinary frequency, urgency, and decreased amount of urine output, as well he does report significant hematuria started yesterday evening which prompted him to come to ED, he reports subjective fever, chills, no nausea, no vomiting. - In ED his workup significant for creatinine of 1.28, white blood cell count of 18.2, lactic acid within normal limit, platelet count at 120 4K, his UA was significantly positive for hematuria, and pyuria with glucosuria (on Farxiga ) CT abdomen pelvis significant for cystitis, [facility] hospitalist consulted to admit.  Assessment and  Plan: 1-sepsis due to hemorrhagic cystitis -Patient may criteria for sepsis with elevated WBCs, identify source of infection (urinalysis and CT findings) and increased respiratory rate - Continue maintaining adequate hydration and complete antibiotic therapy as instructed - Continue to hold Farxiga  and discontinue this medication at discharge - Outpatient follow-up with PCP encouraged   2-ventricular tachycardia - Status post AICD - Patient denies palpitations or any chest discomfort - Continue telemetry monitoring - Goal is for potassium above 4 and magnesium above 2.   3-chronic combined systolic and diastolic heart failure - Euvolemic and currently compensated - Continue carvedilol  and Entresto  - In the setting of UTI Farxiga  will be discontinued - Continue to follow daily weights electrolytes and O's. - Low-sodium diet discussed with patient.   4-hypertension - Continue current antihypertensive agents - Follow vital signs. -Heart healthy/low-sodium diet discussed with patient.   5-hyperlipidemia - Continue statin.   6-aortic stenosis - Continue patient follow-up with cardiology service - Status post bioprosthetic AVR in 2010.   7-obstructive sleep apnea - Continue CPAP nightly.   8-chronic pancreatitis/pancreatic insufficiency - Continue Creon  - Low-fat diet discussed with patient.   9-acute kidney injury - In the setting of UTI and prerenal azotemia most likely - Continue to maintain adequate hydration - Continue treatment with antibiotics for UTI - Follow renal function trend - Patient's creatinine back to normal at time of discharge.    Consultants: None Procedures performed: See below for x-ray reports Disposition: Home Diet recommendation: Heart healthy/low-sodium diet.  DISCHARGE MEDICATION: Allergies as of 08/11/2023       Reactions   Adhesive [tape] Other (See Comments)   PAPER TAPE TAKES OFF THE SKIN, SO PLEASE USE AN ALTERNATIVE!!!!   Cefazolin  Rash        Medication List  STOP taking these medications    dapagliflozin  propanediol 10 MG Tabs tablet Commonly known as: Farxiga        TAKE these medications    acetaminophen  500 MG tablet Commonly known as: TYLENOL  Take 1,000 mg by mouth in the morning and at bedtime.   aspirin  81 MG tablet Take 81 mg by mouth every morning.   atorvastatin  80 MG tablet Commonly known as: LIPITOR  TAKE 1 TABLET AT BEDTIME   carvedilol  3.125 MG tablet Commonly known as: COREG  TAKE 1 TABLET TWICE DAILY WITH MEALS   cefadroxil  500 MG capsule Commonly known as: DURICEF Take 1 capsule (500 mg total) by mouth 2 (two) times daily for 6 days.   ipratropium 0.03 % nasal spray Commonly known as: ATROVENT  Place 2 sprays into both nostrils every 12 (twelve) hours as needed for rhinitis.   lipase/protease/amylase 63999 UNITS Cpep capsule Commonly known as: CREON  Take 72,000 Units by mouth 3 (three) times daily with meals.   lipase/protease/amylase 63999 UNITS Cpep capsule Commonly known as: CREON  Take 1 capsule (36,000 Units total) by mouth with snacks.   PRESCRIPTION MEDICATION Inject 1 Dose into the skin once as needed (plantar fasicitis). Pt is unsure of name of injection, doesn't get it regularly   sacubitril -valsartan  49-51 MG Commonly known as: Entresto  Take 1 tablet by mouth 2 (two) times daily.        Follow-up Information     Bertell Satterfield, MD. Schedule an appointment as soon as possible for a visit in 10 day(s).   Specialty: Internal Medicine Contact information: [address] [place] [address] [phone]                Discharge Exam: [name]   08/09/23 2021  Weight: 96.4 kg   General exam: Alert, awake, oriented x 3; in no acute distress.  No overnight events. Respiratory system: Good saturation on room air. Cardiovascular system: Soft murmur appreciated on exam - Rate controlled, no rubs, no gallops, no  JVD. Gastrointestinal system: Abdomen is nondistended, soft and nontender. No organomegaly or masses felt. Normal bowel sounds heard. Central nervous system: No focal neurological deficits. Extremities: No cyanosis or clubbing. Skin: No petechiae. Psychiatry: Judgement and insight appear normal. Mood & affect appropriate.     Condition at discharge: Stable and improved.  The results of significant diagnostics from this hospitalization (including imaging, microbiology, ancillary and laboratory) are listed below for reference.   Imaging Studies: CT ABDOMEN PELVIS W CONTRAST Result Date: 08/09/2023 CLINICAL DATA:  Acute abdominal pain.  Sepsis.  Urinating blood. EXAM: CT ABDOMEN AND PELVIS WITH CONTRAST TECHNIQUE: Multidetector CT imaging of the abdomen and pelvis was performed using the standard protocol following bolus administration of intravenous contrast. RADIATION DOSE REDUCTION: This exam was performed according to the departmental dose-optimization program which includes automated exposure control, adjustment of the mA and/or kV according to patient size and/or use of iterative reconstruction technique. CONTRAST:  OMNIPAQUE  IOHEXOL  300 MG/ML  SOLN COMPARISON:  CT chest abdomen and pelvis 01/28/2018 FINDINGS: Lower chest: The heart is enlarged.  Lung bases are clear. Hepatobiliary: There is diffuse fatty infiltration of the liver. Gallbladder and bile ducts are within normal limits. Pancreas: Diffuse pancreatic atrophy and calcifications with ductal dilatation appears unchanged from prior. No acute inflammation or fluid collection identified. Spleen: Normal in size without focal abnormality. Adrenals/Urinary Tract: There is a left adrenal nodule measuring 2.1 cm with some internal areas of fat, unchanged from prior. There is no hydronephrosis. There is a  4.2 cm cyst in the right kidney. There is mild bladder wall thickening with surrounding inflammation. There are small bladder diverticuli  bilaterally. Stomach/Bowel: Stomach is within normal limits. Appendix appears normal. No evidence of bowel wall thickening, distention, or inflammatory changes. Duodenal diverticulum present. Vascular/Lymphatic: Aortic atherosclerosis. No enlarged abdominal or pelvic lymph nodes. Reproductive: Prostate gland is enlarged. Other: There is been prior right inguinal hernia repair. There is a small fat containing left inguinal hernia. There is no ascites. Musculoskeletal: No fracture is seen. IMPRESSION: 1. Mild bladder wall thickening with surrounding inflammation compatible with cystitis. 2. Stable left adrenal nodule with some internal areas of fat, likely an adenoma. 3. Stable findings of chronic pancreatitis. 4. Fatty infiltration of the liver. Aortic Atherosclerosis (ICD10-I70.0). Electronically Signed   By: Greig Pique M.D.   On: 08/09/2023 17:21    Microbiology: Results for orders placed or performed during the hospital encounter of 08/09/23  Blood culture (routine x 2)     Status: None (Preliminary result)   Collection Time: 08/09/23  4:13 PM   Specimen: BLOOD RIGHT ARM  Result Value Ref Range Status   Specimen Description BLOOD RIGHT ARM  Final   Special Requests   Final    BOTTLES DRAWN AEROBIC AND ANAEROBIC Blood Culture results may not be optimal due to an inadequate volume of blood received in culture bottles   Culture   Final    NO GROWTH 2 DAYS Performed at [facility], [address] [place], [place] [address]    Report Status PENDING  Incomplete  Blood culture (routine x 2)     Status: None (Preliminary result)   Collection Time: 08/09/23  4:13 PM   Specimen: BLOOD LEFT ARM  Result Value Ref Range Status   Specimen Description BLOOD LEFT ARM  Final   Special Requests   Final    BOTTLES DRAWN AEROBIC AND ANAEROBIC Blood Culture results may not be optimal due to an inadequate volume of blood received in culture bottles   Culture   Final    NO GROWTH 2 DAYS Performed at [facility], [address] [place], [place] [address]    Report Status PENDING  Incomplete  Urine Culture     Status: Abnormal   Collection Time: 08/09/23 11:00 PM   Specimen: Urine, Clean Catch  Result Value Ref Range Status   Specimen Description   Final    URINE, CLEAN CATCH Performed at [facility], [address] [place], [place] [address]    Special Requests   Final    NONE Performed at [facility] [place], [address], [place], [place] [address]    Culture (A)  Final    <10,000 COLONIES/mL INSIGNIFICANT GROWTH Performed at [facility], [address] [place], [place] [address]    Report Status 08/11/2023 FINAL  Final    Labs: CBC: Recent Labs  Lab 08/09/23 1443 08/10/23 0452  WBC 18.2* 17.1*  NEUTROABS 15.6*  --   HGB 17.4* 16.0  HCT 53.4* 51.2  MCV 98.3 100.4*  PLT 124* 103*   Basic Metabolic Panel: Recent Labs  Lab 08/09/23 1443 08/10/23 0452  NA 138 139  K 4.1 4.0  CL 105 105  CO2 21* 27  GLUCOSE 190* 129*  BUN 26* 20  CREATININE 1.28* 1.11  CALCIUM  8.7* 8.5*   Liver Function Tests: Recent Labs  Lab 08/10/23 0452  AST 19  ALT 19  ALKPHOS 50  BILITOT 1.6*  PROT 6.6  ALBUMIN 3.3*  CBG: No results for input(s): GLUCAP in the last 168 hours.  Discharge time spent:  35 minutes.  Signed: Eric Nunnery, MD [facility] 08/11/2023

## 2023-08-11 NOTE — Plan of Care (Signed)
   Problem: Elimination: Goal: Will not experience complications related to bowel motility Outcome: Progressing Goal: Will not experience complications related to urinary retention Outcome: Progressing   Problem: Pain Managment: Goal: General experience of comfort will improve and/or be controlled Outcome: Progressing   Problem: Safety: Goal: Ability to remain free from injury will improve Outcome: Progressing

## 2023-08-14 ENCOUNTER — Ambulatory Visit: Payer: Medicare HMO

## 2023-08-14 DIAGNOSIS — I255 Ischemic cardiomyopathy: Secondary | ICD-10-CM

## 2023-08-14 LAB — CULTURE, BLOOD (ROUTINE X 2)
Culture: NO GROWTH
Culture: NO GROWTH

## 2023-08-15 ENCOUNTER — Ambulatory Visit: Payer: Self-pay

## 2023-08-15 LAB — CUP PACEART REMOTE DEVICE CHECK
Battery Remaining Longevity: 40 mo
Battery Voltage: 2.97 V
Brady Statistic RV Percent Paced: 0.39 %
Date Time Interrogation Session: 20250722033625
HighPow Impedance: 48 Ohm
HighPow Impedance: 64 Ohm
Implantable Lead Connection Status: 753985
Implantable Lead Implant Date: 20080624
Implantable Lead Location: 753860
Implantable Lead Model: 6947
Implantable Pulse Generator Implant Date: 20170714
Lead Channel Impedance Value: 456 Ohm
Lead Channel Impedance Value: 513 Ohm
Lead Channel Pacing Threshold Amplitude: 0.5 V
Lead Channel Pacing Threshold Pulse Width: 0.4 ms
Lead Channel Sensing Intrinsic Amplitude: 10.375 mV
Lead Channel Sensing Intrinsic Amplitude: 10.375 mV
Lead Channel Setting Pacing Amplitude: 2.5 V
Lead Channel Setting Pacing Pulse Width: 0.4 ms
Lead Channel Setting Sensing Sensitivity: 0.9 mV
Zone Setting Status: 755011

## 2023-08-17 DIAGNOSIS — M5481 Occipital neuralgia: Secondary | ICD-10-CM | POA: Diagnosis not present

## 2023-08-17 DIAGNOSIS — I358 Other nonrheumatic aortic valve disorders: Secondary | ICD-10-CM | POA: Diagnosis not present

## 2023-08-17 DIAGNOSIS — I714 Abdominal aortic aneurysm, without rupture, unspecified: Secondary | ICD-10-CM | POA: Diagnosis not present

## 2023-08-17 DIAGNOSIS — J449 Chronic obstructive pulmonary disease, unspecified: Secondary | ICD-10-CM | POA: Diagnosis not present

## 2023-08-17 DIAGNOSIS — Z6829 Body mass index (BMI) 29.0-29.9, adult: Secondary | ICD-10-CM | POA: Diagnosis not present

## 2023-08-17 DIAGNOSIS — I10 Essential (primary) hypertension: Secondary | ICD-10-CM | POA: Diagnosis not present

## 2023-08-17 DIAGNOSIS — E6609 Other obesity due to excess calories: Secondary | ICD-10-CM | POA: Diagnosis not present

## 2023-08-17 DIAGNOSIS — I5022 Chronic systolic (congestive) heart failure: Secondary | ICD-10-CM | POA: Diagnosis not present

## 2023-08-21 ENCOUNTER — Ambulatory Visit: Admitting: Podiatry

## 2023-08-23 DIAGNOSIS — K861 Other chronic pancreatitis: Secondary | ICD-10-CM | POA: Diagnosis not present

## 2023-09-14 ENCOUNTER — Telehealth: Payer: Self-pay | Admitting: Cardiology

## 2023-09-14 NOTE — Telephone Encounter (Signed)
 Pt c/o medication issue:  1. Name of Medication:   dapagliflozin  propanediol (FARXIGA ) 10 MG TABS tablet   2. How are you currently taking this medication (dosage and times per day)?   3. Are you having a reaction (difficulty breathing--STAT)?   4. What is your medication issue?   Patient stated he stopped taking this medication about 6-8 weeks ago due to a UTI.  Patient wants to know if he should re-start this medication.

## 2023-09-14 NOTE — Telephone Encounter (Signed)
 Forward to PharmD to comment.

## 2023-09-17 ENCOUNTER — Telehealth: Payer: Self-pay | Admitting: Pharmacy Technician

## 2023-09-17 MED ORDER — EMPAGLIFLOZIN 10 MG PO TABS: 10.0000 mg | ORAL_TABLET | Freq: Every day | ORAL | Status: DC

## 2023-09-17 NOTE — Telephone Encounter (Signed)
 He has had only one UTI. Secure from Dr.Branch:has he had more than 1 UTI within the last few months. If not then resume, if recurrent issue then would remain off farxiga    He will resume farxiga  10 mg daily and keep us  apprised.

## 2023-09-17 NOTE — Telephone Encounter (Signed)
 We received this request for a refill for farxiga  for this patient from [facility]: [facility] - [place], [place] - [address], [place] [address]  Phone:?[phone]??Fax:?[phone]  Also, there is a pending note in another encounter asking if he should restart this since he was told to stop 6-8 weeks ago due to a UTI.

## 2023-09-17 NOTE — Telephone Encounter (Signed)
 Would defer to Dr. Alvan. If this was pt first UTI in the 3 or 4 years he has been on, then I think its fine to re challenge. Would confirm w/ pt if he has ever had any other UTI.

## 2023-09-19 ENCOUNTER — Other Ambulatory Visit: Payer: Self-pay

## 2023-09-19 MED ORDER — EMPAGLIFLOZIN 10 MG PO TABS: 10.0000 mg | ORAL_TABLET | Freq: Every day | ORAL | 3 refills | Status: DC

## 2023-09-21 ENCOUNTER — Encounter: Admitting: Internal Medicine

## 2023-10-03 ENCOUNTER — Ambulatory Visit: Attending: Internal Medicine | Admitting: Internal Medicine

## 2023-10-03 ENCOUNTER — Encounter: Admitting: Internal Medicine

## 2023-10-03 ENCOUNTER — Encounter: Payer: Self-pay | Admitting: Internal Medicine

## 2023-10-03 VITALS — BP 126/72 | HR 54 | Ht 70.0 in | Wt 214.8 lb

## 2023-10-03 DIAGNOSIS — L570 Actinic keratosis: Secondary | ICD-10-CM | POA: Diagnosis not present

## 2023-10-03 DIAGNOSIS — I472 Ventricular tachycardia, unspecified: Secondary | ICD-10-CM | POA: Diagnosis not present

## 2023-10-03 DIAGNOSIS — D692 Other nonthrombocytopenic purpura: Secondary | ICD-10-CM | POA: Diagnosis not present

## 2023-10-03 DIAGNOSIS — L821 Other seborrheic keratosis: Secondary | ICD-10-CM | POA: Diagnosis not present

## 2023-10-03 DIAGNOSIS — Z85828 Personal history of other malignant neoplasm of skin: Secondary | ICD-10-CM | POA: Diagnosis not present

## 2023-10-03 LAB — CUP PACEART INCLINIC DEVICE CHECK
Date Time Interrogation Session: 20250910123124
HighPow Impedance: 52 Ohm
Implantable Lead Connection Status: 753985
Implantable Lead Implant Date: 20080624
Implantable Lead Location: 753860
Implantable Lead Model: 6947
Implantable Pulse Generator Implant Date: 20170714
Lead Channel Impedance Value: 551 Ohm
Lead Channel Pacing Threshold Amplitude: 0.5 V
Lead Channel Pacing Threshold Pulse Width: 0.4 ms
Lead Channel Sensing Intrinsic Amplitude: 13.8 mV
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
## 2023-10-03 NOTE — Patient Instructions (Signed)

## 2023-10-26 NOTE — Progress Notes (Signed)
 Remote ICD Transmission

## 2023-10-31 ENCOUNTER — Other Ambulatory Visit: Payer: Self-pay | Admitting: Cardiology

## 2023-11-13 ENCOUNTER — Ambulatory Visit: Attending: Internal Medicine

## 2023-11-13 DIAGNOSIS — I255 Ischemic cardiomyopathy: Secondary | ICD-10-CM | POA: Diagnosis not present

## 2023-11-15 ENCOUNTER — Ambulatory Visit: Payer: Self-pay | Admitting: Internal Medicine

## 2023-11-15 LAB — CUP PACEART REMOTE DEVICE CHECK
Battery Remaining Longevity: 39 mo
Battery Voltage: 2.96 V
Brady Statistic RV Percent Paced: 1.18 %
Date Time Interrogation Session: 20251022001704
HighPow Impedance: 49 Ohm
HighPow Impedance: 71 Ohm
Implantable Lead Connection Status: 753985
Implantable Lead Implant Date: 20080624
Implantable Lead Location: 753860
Implantable Lead Model: 6947
Implantable Pulse Generator Implant Date: 20170714
Lead Channel Impedance Value: 513 Ohm
Lead Channel Impedance Value: 551 Ohm
Lead Channel Pacing Threshold Amplitude: 0.375 V
Lead Channel Pacing Threshold Pulse Width: 0.4 ms
Lead Channel Sensing Intrinsic Amplitude: 10.125 mV
Lead Channel Sensing Intrinsic Amplitude: 10.125 mV
Lead Channel Setting Pacing Amplitude: 2.5 V
Lead Channel Setting Pacing Pulse Width: 0.4 ms
Lead Channel Setting Sensing Sensitivity: 0.9 mV
Zone Setting Status: 755011

## 2023-11-16 NOTE — Progress Notes (Signed)
 Remote ICD Transmission

## 2023-11-28 ENCOUNTER — Other Ambulatory Visit: Payer: Self-pay | Admitting: Cardiology

## 2023-11-28 ENCOUNTER — Other Ambulatory Visit: Payer: Self-pay | Admitting: Internal Medicine

## 2023-12-11 ENCOUNTER — Encounter: Admitting: Internal Medicine

## 2023-12-13 ENCOUNTER — Ambulatory Visit: Admitting: Cardiology

## 2023-12-24 ENCOUNTER — Encounter: Payer: Self-pay | Admitting: Cardiology

## 2023-12-24 ENCOUNTER — Ambulatory Visit: Attending: Cardiology | Admitting: Cardiology

## 2023-12-24 VITALS — BP 112/82 | HR 48 | Ht 70.5 in | Wt 212.0 lb

## 2023-12-24 DIAGNOSIS — I10 Essential (primary) hypertension: Secondary | ICD-10-CM

## 2023-12-24 DIAGNOSIS — I472 Ventricular tachycardia, unspecified: Secondary | ICD-10-CM | POA: Diagnosis not present

## 2023-12-24 DIAGNOSIS — I5022 Chronic systolic (congestive) heart failure: Secondary | ICD-10-CM

## 2023-12-24 DIAGNOSIS — I2510 Atherosclerotic heart disease of native coronary artery without angina pectoris: Secondary | ICD-10-CM

## 2023-12-24 DIAGNOSIS — R001 Bradycardia, unspecified: Secondary | ICD-10-CM | POA: Diagnosis not present
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
## 2023-12-24 NOTE — Patient Instructions (Addendum)
Medication Instructions:  Continue all current medications.  Labwork: none  Testing/Procedures: none  Follow-Up: 4 months   Any Other Special Instructions Will Be Listed Below (If Applicable).  If you need a refill on your cardiac medications before your next appointment, please call your pharmacy.\ 

## 2023-12-26 ENCOUNTER — Other Ambulatory Visit (HOSPITAL_COMMUNITY): Payer: Self-pay

## 2023-12-26 ENCOUNTER — Telehealth: Payer: Self-pay | Admitting: Pharmacy Technician

## 2023-12-26 NOTE — Telephone Encounter (Signed)
   He gets farxiga  through [employer]. He said he has enough farxiga  until feb  Entresto  too soon until 02/05/24   Patient will call me back tomorrow. [employer] has his ssn linked to another persons name. He has a savings program that he is signed up for until 01/23/24 through his medicare. I will call [employer] tomorrow as well to try to figure out what name is on his ssn

## 2023-12-27 NOTE — Telephone Encounter (Addendum)
 The patient called back and he is signed up for medicare savings program and pays 50.00 a month. He said his creon  is over 400 a month. He would have to come off medicare payment plan to get the [employer] grant then he would be responsible for creon . The entresto  is now generic and should be cheaper. His other generics are free. He would come out better to keep the medicare savings program and get entresto  and creon  through that. He will come in Monday for me to help him with the paperwork for farxiga  [employer]

## 2023-12-27 NOTE — Telephone Encounter (Signed)
 Patient called back and he said he has enough until June. He will call me in march and complete the form

## 2024-02-12 ENCOUNTER — Ambulatory Visit

## 2024-02-12 DIAGNOSIS — I255 Ischemic cardiomyopathy: Secondary | ICD-10-CM | POA: Diagnosis not present

## 2024-02-14 ENCOUNTER — Telehealth: Payer: Self-pay | Admitting: *Deleted

## 2024-02-14 LAB — CUP PACEART REMOTE DEVICE CHECK
Battery Remaining Longevity: 37 mo
Battery Voltage: 2.95 V
Brady Statistic RV Percent Paced: 1.57 %
Date Time Interrogation Session: 20260120073830
HighPow Impedance: 49 Ohm
HighPow Impedance: 66 Ohm
Implantable Lead Connection Status: 753985
Implantable Lead Implant Date: 20080624
Implantable Lead Location: 753860
Implantable Lead Model: 6947
Implantable Pulse Generator Implant Date: 20170714
Lead Channel Impedance Value: 475 Ohm
Lead Channel Impedance Value: 532 Ohm
Lead Channel Pacing Threshold Amplitude: 0.375 V
Lead Channel Pacing Threshold Pulse Width: 0.4 ms
Lead Channel Sensing Intrinsic Amplitude: 13 mV
Lead Channel Sensing Intrinsic Amplitude: 13 mV
Lead Channel Setting Pacing Amplitude: 2.5 V
Lead Channel Setting Pacing Pulse Width: 0.4 ms
Lead Channel Setting Sensing Sensitivity: 0.9 mV
Zone Setting Status: 755011

## 2024-02-14 NOTE — Telephone Encounter (Signed)
 High alert received from [employer]:  ICD: Scheduled remote reviewed. Normal device function.  Presenting rhythm: VS AF episodes false, EGMs show d/t ectopy 1 NSVT, >20 beats, V-rate 184 bpm (11/30/2023) VT, V-rate 188 bpm, successful conversion w/1 burst ATP (12/03/2023) sent to triage high priority Next remote transmission per protocol. ML, [facility] ___________________________________________________________________________  Episode #117 EGM positive for morphology change that is c/w true VT when compared to presenting EGM  Called to speak with patient and assess for symptoms from VT episode with ATP  No answer  Left voicemail requesting a call back  Routing to device triage for follow up with patient  Scheduled remote routed to MD for review and further recommendations if needed

## 2024-02-14 NOTE — Telephone Encounter (Signed)
 Notified patient.  He is doing well, no concerns.  Reports that he thinks he was asleep at that time but can't really remember.    Patient given  DMV 6 month driving restrictions.   Patient aware we will continue to monitor. If any concerns will follow up with patient.  He is not due for annual follow up with [facility] in [place] until the fall unless starts to have more events or symptoms and we need to move up sooner.

## 2024-02-15 NOTE — Progress Notes (Signed)
 Remote ICD Transmission

## 2024-02-24 ENCOUNTER — Ambulatory Visit: Payer: Self-pay | Admitting: Student in an Organized Health Care Education/Training Program

## 2024-05-13 ENCOUNTER — Ambulatory Visit

## 2024-08-12 ENCOUNTER — Ambulatory Visit

## 2024-11-11 ENCOUNTER — Ambulatory Visit

## 2025-02-10 ENCOUNTER — Ambulatory Visit

## 2025-05-12 ENCOUNTER — Ambulatory Visit

## 2025-08-11 ENCOUNTER — Ambulatory Visit
